# Patient Record
Sex: Male | Born: 1945 | Race: White | Hispanic: No | Smoking: Never smoker
Health system: Southern US, Community
[De-identification: ages and names within clinical notes are randomized; demographics above are authoritative.]

## PROBLEM LIST (undated history)

## (undated) DIAGNOSIS — F419 Anxiety disorder, unspecified: Secondary | ICD-10-CM

## (undated) DIAGNOSIS — I10 Essential (primary) hypertension: Secondary | ICD-10-CM

## (undated) DIAGNOSIS — R350 Frequency of micturition: Secondary | ICD-10-CM

## (undated) DIAGNOSIS — T4145XA Adverse effect of unspecified anesthetic, initial encounter: Secondary | ICD-10-CM

## (undated) DIAGNOSIS — M1990 Unspecified osteoarthritis, unspecified site: Secondary | ICD-10-CM

## (undated) DIAGNOSIS — M797 Fibromyalgia: Secondary | ICD-10-CM

## (undated) DIAGNOSIS — E785 Hyperlipidemia, unspecified: Secondary | ICD-10-CM

## (undated) DIAGNOSIS — T8859XA Other complications of anesthesia, initial encounter: Secondary | ICD-10-CM

## (undated) DIAGNOSIS — E232 Diabetes insipidus: Secondary | ICD-10-CM

## (undated) DIAGNOSIS — M26609 Unspecified temporomandibular joint disorder, unspecified side: Secondary | ICD-10-CM

## (undated) DIAGNOSIS — G4730 Sleep apnea, unspecified: Secondary | ICD-10-CM

## (undated) DIAGNOSIS — Z87442 Personal history of urinary calculi: Secondary | ICD-10-CM

## (undated) DIAGNOSIS — E119 Type 2 diabetes mellitus without complications: Secondary | ICD-10-CM

## (undated) HISTORY — DX: Fibromyalgia: M79.7

## (undated) HISTORY — PX: BRAIN SURGERY: SHX531

## (undated) HISTORY — DX: Hyperlipidemia, unspecified: E78.5

## (undated) HISTORY — DX: Type 2 diabetes mellitus without complications: E11.9

## (undated) HISTORY — PX: SPINE SURGERY: SHX786

## (undated) HISTORY — PX: ROTATOR CUFF REPAIR: SHX139

## (undated) HISTORY — DX: Essential (primary) hypertension: I10

---

## 1998-01-12 ENCOUNTER — Ambulatory Visit (HOSPITAL_COMMUNITY): Admission: RE | Admit: 1998-01-12 | Discharge: 1998-01-12 | Payer: Self-pay | Admitting: Neurosurgery

## 1998-02-02 ENCOUNTER — Ambulatory Visit: Admission: RE | Admit: 1998-02-02 | Discharge: 1998-02-02 | Payer: Self-pay | Admitting: Otolaryngology

## 1998-07-09 ENCOUNTER — Encounter: Payer: Self-pay | Admitting: Neurosurgery

## 1998-07-09 ENCOUNTER — Ambulatory Visit (HOSPITAL_COMMUNITY): Admission: RE | Admit: 1998-07-09 | Discharge: 1998-07-09 | Payer: Self-pay | Admitting: Neurosurgery

## 1999-07-08 ENCOUNTER — Ambulatory Visit (HOSPITAL_COMMUNITY): Admission: RE | Admit: 1999-07-08 | Discharge: 1999-07-08 | Payer: Self-pay | Admitting: Neurosurgery

## 1999-07-08 ENCOUNTER — Encounter: Payer: Self-pay | Admitting: Neurosurgery

## 2000-07-03 ENCOUNTER — Ambulatory Visit (HOSPITAL_COMMUNITY): Admission: RE | Admit: 2000-07-03 | Discharge: 2000-07-03 | Payer: Self-pay | Admitting: Neurosurgery

## 2000-07-03 ENCOUNTER — Encounter: Payer: Self-pay | Admitting: Neurosurgery

## 2002-06-24 ENCOUNTER — Encounter: Payer: Self-pay | Admitting: Neurosurgery

## 2002-06-28 ENCOUNTER — Inpatient Hospital Stay (HOSPITAL_COMMUNITY): Admission: RE | Admit: 2002-06-28 | Discharge: 2002-06-29 | Payer: Self-pay | Admitting: Neurosurgery

## 2002-06-28 ENCOUNTER — Encounter: Payer: Self-pay | Admitting: Neurosurgery

## 2005-03-13 ENCOUNTER — Encounter: Admission: RE | Admit: 2005-03-13 | Discharge: 2005-03-13 | Payer: Self-pay | Admitting: Otolaryngology

## 2010-07-16 ENCOUNTER — Encounter
Admission: RE | Admit: 2010-07-16 | Discharge: 2010-08-19 | Payer: Self-pay | Source: Home / Self Care | Attending: Neurosurgery | Admitting: Neurosurgery

## 2011-01-17 NOTE — Op Note (Signed)
NAME:  Troy Collier, Troy Collier NO.:  [id number]   MEDICAL RECORD NO.:  [id number]                   PATIENT TYPE:  INP   LOCATION:  3172                                 FACILITY:  [facility]   PHYSICIAN:  Danae Orleans. Venetia Maxon, M.D.               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   PROCEDURE:  Anterior cervical diskectomy and fusion, C4-5, C5-6, and C6-7  levels, with allograft bone graft and anterior cervical plate.   SURGEON:  Danae Orleans. Venetia Maxon, M.D.   ASSISTANT:  Payton Doughty, M.D.   ANESTHESIA:  General endotracheal anesthesia.   ESTIMATED BLOOD LOSS:  75 cc.   COMPLICATIONS:  None.   DISPOSITION:  To recovery.   INDICATIONS:  The patient is a 65 year old man with cervical  myeloradiculopathy.  The C4-5, C5-6, and C6-7 levels are all affected.  It  was elected to take him to surgery for anterior cervical diskectomy and  fusion at these affected levels.   DESCRIPTION OF PROCEDURE:  The patient was brought to the operating room.  Following satisfactory and uncomplicated induction of general endotracheal  anesthesia and placement of intravenous lines, the patient was placed in the  supine position on the operating table.  His neck was placed in slight  extension, and he was placed in 10 pounds of Holter traction.  His anterior  neck was then prepped and draped in the usual sterile fashion.  The area of  planned incision was infiltrated with 0.25% Marcaine and 0.5% lidocaine with  1:200,000 epinephrine.  An incision was made from the midline to  the  anterior border of the sternocleidomastoid muscle, carried sharply through  the platysmal layer.  Subplatysmal dissection was then performed, and using  blunt dissection the carotid sheath was kept lateral and the trachea and  esophagus kept medial, exposing the anterior cervical spine.  A spinal  needle was placed over what was felt to be the C4-5 level, and this was  confirmed on intraoperative x-ray.  Subsequently the longus colli muscles  were taken down from the anterior spine from the C4 through C7 levels  bilaterally using electrocautery, Key elevator, and the Shadow Line self-  retaining retractor was placed along with up-down retractor.  The C5-6 and  C6-7 levels were initially operated.  They had large osteophytes, removed  with the Leksell rongeurs, and subsequently the disk space was incised and  disk material was then removed in a piecemeal fashion using pituitary  rongeurs and a variety of Karlin curettes.  The  disk space spreaders were  placed and residual disk material was cleared out down to the level of the  posterior longitudinal ligament.  Subsequently the Anspach drill and the A2  equivalent bur was then used to drill the end plates, initially at C6-7, and  subsequently the posterior longitudinal ligament was incised with an  arachnoid knife and removed in a piecemeal fashion using 2 and 3 mm gold-  tipped Kerrison rongeurs.  Both C7 nerve roots were decompressed widely as  they extended up the neural foramina.  The central spinal cord dura was also  decompressed.  Hemostasis was assured with Gelfoam soaked in thrombin and  after sizing with dummy sizers, an 8 mm tricortical iliac crest allograft  bone graft was cut to a depth of approximately 12 mm and inserted into the  interspace and counter sunk appropriately.  Attention was then turned to the  C5-6 level, where a similar decompression was performed, and again the  central cord dura was decompressed, as were  both C6 nerve roots.  A  similarly-sized bone graft was placed at this level.  Subsequently attention  was then turned to the C4-5 level.  The retractor was moved to this level.  The disk space was incised and disk material was cleared in a similar  fashion to the other levels.  The disk space spreader was placed.  The  interspace was drilled with the high-speed drill.  There was a significant  amount of herniated disk material, particularly on the left, at the C4-5  level affecting the left C5 nerve root.  The dura and spinal cord were  decompressed.  Hemostasis was again assured with Gelfoam soaked in thrombin.  Then a similarly-sized 8 mm bone graft was inserted into this interspace and  counter sunk appropriately.  Subsequently the microscope was taken out of  the field and using the Trinica anterior cervical plate system, a 66 mm  plate was selected and found to fit appropriately.  This was then affixed to  the anterior cervical spine using 14 mm variable-angled screws, two at C4,  two at C5, two at C6, and two at C7 levels.  All screws had excellent  purchase.  They were tightened securely and then locking mechanisms were  engaged.  The wound was then copiously irrigated with bacitracin and saline,  hemostasis was assured, soft tissues were inspected and found to be in good  repair.  The final x-ray confirmed positioning of bone graft and anterior  cervical plate from C4 through the C6 level.  It was not possible to  visualize C7 because of the patient's body habitus.  The platysmal layer was  then closed with 3-0 Vicryl sutures and the skin edges reapproximated with  running 4-0 Vicryl subcuticular stitch.  The wound was dressed with  Dermabond.  The patient was extubated in the operating room and taken to the  recovery room in stable and satisfactory condition, having tolerated his  operation well.  Counts were correct at the end of the case.                                               Danae Orleans. Venetia Maxon, M.D.    JDS/MEDQ  D:  06/28/2002  T:  06/28/2002  Job:  [id number]

## 2013-01-20 ENCOUNTER — Encounter: Payer: Self-pay | Admitting: Dietician

## 2013-01-20 ENCOUNTER — Encounter: Payer: 59 | Attending: Physician Assistant | Admitting: Dietician

## 2013-01-20 VITALS — Ht 68.0 in | Wt 204.0 lb

## 2013-01-20 DIAGNOSIS — E119 Type 2 diabetes mellitus without complications: Secondary | ICD-10-CM

## 2013-01-20 NOTE — Progress Notes (Unsigned)
  Patient was seen on 01/20/2013 for the first of a series of three diabetes self-management courses at the [facility]. The following learning objectives were met by the patient during this course:  HT:68 in    WT: 204.0 lb    A1C: 6.5   (10/2012)   Defines the role of glucose and insulin  Identifies type of diabetes and pathophysiology  Defines the diagnostic criteria for diabetes and prediabetes  States the risk factors for Type 2 Diabetes  States the symptoms of Type 2 Diabetes  Defines Type 2 Diabetes treatment goals  Defines Type 2 Diabetes treatment options  States the rationale for glucose monitoring  Identifies A1C, glucose targets, and testing times  Identifies proper sharps disposal  Defines the purpose of a diabetes food plan  Identifies carbohydrate food groups  Defines effects of carbohydrate foods on glucose levels  Identifies carbohydrate choices/grams/food labels  States benefits of physical activity and effect on glucose  Review of suggested activity guidelines  Handouts given during class include:  Type 2 Diabetes: Basics Book  My Food Plan Book  Food and Activity Log   Follow-Up Plan: Plans to contact insurance company and see if they will cover the remaining Diabetes Self-management Core Class 2 &3

## 2013-04-01 ENCOUNTER — Other Ambulatory Visit: Payer: Self-pay | Admitting: Neurosurgery

## 2013-04-25 ENCOUNTER — Other Ambulatory Visit: Payer: Self-pay | Admitting: *Deleted

## 2013-04-25 DIAGNOSIS — E7800 Pure hypercholesterolemia, unspecified: Secondary | ICD-10-CM

## 2013-04-25 DIAGNOSIS — E039 Hypothyroidism, unspecified: Secondary | ICD-10-CM | POA: Insufficient documentation

## 2013-04-26 ENCOUNTER — Other Ambulatory Visit (INDEPENDENT_AMBULATORY_CARE_PROVIDER_SITE_OTHER): Payer: 59

## 2013-04-26 ENCOUNTER — Other Ambulatory Visit: Payer: Self-pay | Admitting: *Deleted

## 2013-04-26 DIAGNOSIS — E7800 Pure hypercholesterolemia, unspecified: Secondary | ICD-10-CM

## 2013-04-26 DIAGNOSIS — E039 Hypothyroidism, unspecified: Secondary | ICD-10-CM

## 2013-04-26 DIAGNOSIS — E119 Type 2 diabetes mellitus without complications: Secondary | ICD-10-CM

## 2013-04-26 DIAGNOSIS — R7303 Prediabetes: Secondary | ICD-10-CM | POA: Insufficient documentation

## 2013-04-26 LAB — LIPID PANEL
LDL Cholesterol: 54 mg/dL (ref 0–99)
Total CHOL/HDL Ratio: 3

## 2013-04-26 LAB — T4, FREE: Free T4: 1.1 ng/dL (ref 0.60–1.60)

## 2013-04-26 LAB — TSH: TSH: 0.1 u[IU]/mL — ABNORMAL LOW (ref 0.35–5.50)

## 2013-04-28 ENCOUNTER — Ambulatory Visit (INDEPENDENT_AMBULATORY_CARE_PROVIDER_SITE_OTHER): Payer: 59 | Admitting: Endocrinology

## 2013-04-28 ENCOUNTER — Encounter: Payer: Self-pay | Admitting: Endocrinology

## 2013-04-28 VITALS — BP 132/76 | HR 87 | Temp 98.3°F | Resp 12 | Ht 67.0 in | Wt 202.3 lb

## 2013-04-28 DIAGNOSIS — E7800 Pure hypercholesterolemia, unspecified: Secondary | ICD-10-CM

## 2013-04-28 DIAGNOSIS — E230 Hypopituitarism: Secondary | ICD-10-CM

## 2013-04-28 DIAGNOSIS — E119 Type 2 diabetes mellitus without complications: Secondary | ICD-10-CM

## 2013-04-28 LAB — HEMOGLOBIN A1C: Hgb A1c MFr Bld: 6.1 % (ref 4.6–6.5)

## 2013-04-28 LAB — COMPREHENSIVE METABOLIC PANEL
Albumin: 4.3 g/dL (ref 3.5–5.2)
Alkaline Phosphatase: 68 U/L (ref 39–117)
BUN: 15 mg/dL (ref 6–23)
Creatinine, Ser: 0.8 mg/dL (ref 0.4–1.5)
Glucose, Bld: 106 mg/dL — ABNORMAL HIGH (ref 70–99)
Potassium: 4.4 mEq/L (ref 3.5–5.1)

## 2013-04-28 LAB — URINALYSIS
Hgb urine dipstick: NEGATIVE
Ketones, ur: NEGATIVE
Leukocytes, UA: NEGATIVE
Urine Glucose: NEGATIVE
Urobilinogen, UA: 0.2 (ref 0.0–1.0)

## 2013-04-28 LAB — TESTOSTERONE: Testosterone: 224.55 ng/dL — ABNORMAL LOW (ref 350.00–890.00)

## 2013-04-28 MED ORDER — DESMOPRESSIN ACETATE 0.2 MG PO TABS: 0.4000 mg | ORAL_TABLET | Freq: Three times a day (TID) | ORAL | Status: DC

## 2013-04-28 NOTE — Progress Notes (Signed)
Patient ID: Troy Collier, male   DOB: 22-Jul-1946, 67 y.o.   MRN: [id number]  Chief complaint: Followup of hypopituitarism  History of Present Illness:  He has had successful pituitary surgery in 1/99.  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  1. Hormone deficiency: He has been on growth hormone supplement since 2001. He thinks he is taking 0.35 mg daily although has been on as low dose as 0.2 mg previously. Overall has fairly normal energy level and no unusual fatigue 2. Hypothyroidism: Has been on relatively stable doses of thyroid supplement since his surgery 3. Hypogonadism: He is currently taking Axiron with normal libido and energy level. His testosterone supplement has been changed periodically because of insurance preference and his last level was normal 4. Adrenal insufficiency: He has been on stable doses of hydrocortisone and no recent problems with unusual weight change, nausea or lightheadedness. For some time has been taking only 15 mg hydrocortisone a day 5. Diabetes insipidus: He continues to take 2 tablets 3 times a day, roughly every 8 hours. He did have transient hyponatremia at one time and with reducing the dose to 5 tablets this was back to normal. However he tends to have increased urinary frequency and thirst if he takes less than 6 tablets a day   OTHER problems:  He has had impaired fasting glucose and IFG and this has been managed with diet and exercise alone. Recently not able to exercise because of cervical disc problems  Dyslipidemia/metabolic syndrome: He is on Lipitor, tends to have relatively low HDL        Medication List       This list is accurate as of: 04/28/13  1:35 PM.  Always use your most recent med list.               acetaminophen-codeine 300-30 MG per tablet  Commonly known as:  TYLENOL #3  Take 1 tablet by mouth every 4 (four) hours as needed for pain (can take twice a day).     ALPRAZolam 0.5 MG tablet   Commonly known as:  XANAX  Take 0.5 mg by mouth at bedtime as needed for sleep.     aspirin 81 MG tablet  Take 81 mg by mouth daily.     atorvastatin 10 MG tablet  Commonly known as:  LIPITOR  Take 10 mg by mouth daily.     AXIRON 30 MG/ACT Soln  Generic drug:  Testosterone  Place onto the skin. Takes 3 pumps per day     cyclobenzaprine 10 MG tablet  Commonly known as:  FLEXERIL  Take 10 mg by mouth 3 (three) times daily as needed for muscle spasms.     desmopressin 0.2 MG tablet  Commonly known as:  DDAVP  Take 0.2 mg by mouth 3 (three) times daily. 2 tabs three times per day.     DULoxetine 60 MG capsule  Commonly known as:  CYMBALTA  Take 60 mg by mouth daily.     etodolac 400 MG tablet  Commonly known as:  LODINE  Take 400 mg by mouth 2 (two) times daily.     hydrocortisone 5 MG tablet  Commonly known as:  CORTEF  Take 5 mg by mouth daily. 2 tabs in the AM and 1 tab at bedtime     levothyroxine 150 MCG tablet  Commonly known as:  SYNTHROID, LEVOTHROID  Take 150 mcg by mouth daily before breakfast.     losartan 100 MG tablet  Commonly known as:  COZAAR  Take 100 mg by mouth daily.     NORDITROPIN 15 MG/1.5ML Soln  Generic drug:  Somatropin  Inject into the skin. 0.35 mg injection daily.     tamsulosin 0.4 MG Caps capsule  Commonly known as:  FLOMAX  Take 0.4 mg by mouth 2 (two) times daily. 2 cap two times per day     traZODone 100 MG tablet  Commonly known as:  DESYREL  Take 100 mg by mouth at bedtime.        Allergies:  Allergies  Allergen Reactions  . Accupril [Quinapril Hcl]   . Niacin And Related     Past Medical History  Diagnosis Date  . Hyperlipidemia   . Diabetes mellitus without complication   . Hypertension   . Fibromyalgia     Past Surgical History  Procedure Laterality Date  . Spine surgery    . Brain surgery    . Rotator cuff repair      Family History  Problem Relation Age of Onset  . Hypertension Other   . Obesity  Other   . Heart attack Other     Social History:  reports that he has never smoked. He has never used smokeless tobacco. He reports that  drinks alcohol. He reports that he does not use illicit drugs.  Review of Systems -   HYPERTENSION: He has been on treatment for several years, BP at home  recently130/75  History of depression   EXAM:  BP 132/76  Pulse 87  Temp(Src) 98.3 F (36.8 C)  Resp 12  Ht 5\' 7"  (1.702 m)  Wt 202 lb 4.8 oz (91.763 kg)  BMI 31.68 kg/m2  SpO2 96%  No pedal edema Biceps reflexes appear normal  Assessment/Plan:   PANHYPOPITUITARISM:  Overall well controlled with following problems 1. Secondary hypothyroidism: Clinically doing well, free T4 to be checked 2. Cortisol deficiency: He is still doing well with only small doses of supplement and no clinical signs or symptoms of adrenal insufficiency. To check electrolytes 3. Growth hormone deficiency, long-term and requiring low-dose supplementation with good results previously with improved energy level, no complaints today 4. Hypogonadism: Subjectively has been doing well with supplementation with normal energy level and libido. Testosterone level pending and we'll discuss results when available 5. Diabetes insipidus, long-standing he is quite compliant with his supplementation and appears to be asymptomatic if taking less than 6 tablets a day, sodium pending. No nocturia  Impaired Fasting glucose: To check glucose and A1c today, again emphasized working on weight loss with diet and exercise  Hyperlipidemia/metabolic syndrome: To check lipids. Is on Lipitor for cardiovascular prophylaxis  Hypertension: Followed by PCP, today has good control  Ariyona Eid 04/28/2013, 1:35 PM   Addendum: Growth hormone needs to be reduced, IGF-1 is high, new dose to be 0.30 Also increase Axiron to 4 pumps a day  Office Visit on 04/28/2013  Component Date Value Range Status  . Sodium 04/28/2013 140  135 - 145 mEq/L  Final  . Potassium 04/28/2013 4.4  3.5 - 5.1 mEq/L Final  . Chloride 04/28/2013 100  96 - 112 mEq/L Final  . CO2 04/28/2013 30  19 - 32 mEq/L Final  . Glucose, Bld 04/28/2013 106* 70 - 99 mg/dL Final  . BUN 16/06/9603 15  6 - 23 mg/dL Final  . Creatinine, Ser 04/28/2013 0.8  0.4 - 1.5 mg/dL Final  . Total Bilirubin 04/28/2013 0.5  0.3 - 1.2 mg/dL Final  . Alkaline Phosphatase  04/28/2013 68  39 - 117 U/L Final  . AST 04/28/2013 19  0 - 37 U/L Final  . ALT 04/28/2013 28  0 - 53 U/L Final  . Total Protein 04/28/2013 7.2  6.0 - 8.3 g/dL Final  . Albumin 40/98/1191 4.3  3.5 - 5.2 g/dL Final  . Calcium 47/82/9562 9.6  8.4 - 10.5 mg/dL Final  . GFR 13/04/6577 99.94  >60.00 mL/min Final  . Hemoglobin A1C 04/28/2013 6.1  4.6 - 6.5 % Final   Glycemic Control Guidelines for People with Diabetes:Non Diabetic:  <6%Goal of Therapy: <7%Additional Action Suggested:  >8%   . Testosterone 04/28/2013 224.55* 350.00 - 890.00 ng/dL Final  . Somatomedin (IGF-I) 04/28/2013 214* 35 - 196 ng/mL Final  . Color, Urine 04/28/2013 LT. YELLOW  Yellow;Lt. Yellow Final  . APPearance 04/28/2013 CLEAR  Clear Final  . Specific Gravity, Urine 04/28/2013 1.015  1.000-1.030 Final  . pH 04/28/2013 6.5  5.0 - 8.0 Final  . Total Protein, Urine 04/28/2013 NEGATIVE  Negative Final  . Urine Glucose 04/28/2013 NEGATIVE  Negative Final  . Ketones, ur 04/28/2013 NEGATIVE  Negative Final  . Bilirubin Urine 04/28/2013 MODERATE  Negative Final  . Hgb urine dipstick 04/28/2013 NEGATIVE  Negative Final  . Urobilinogen, UA 04/28/2013 0.2  0.0 - 1.0 Final  . Leukocytes, UA 04/28/2013 NEGATIVE  Negative Final  . Nitrite 04/28/2013 NEGATIVE  Negative Final  Appointment on 04/26/2013  Component Date Value Range Status  . Free T4 04/26/2013 1.10  0.60 - 1.60 ng/dL Final  . TSH 46/96/2952 0.10* 0.35 - 5.50 uIU/mL Final  . Cholesterol 04/26/2013 110  0 - 200 mg/dL Final   ATP III Classification       Desirable:  < 200 mg/dL                Borderline High:  200 - 239 mg/dL          High:  > = 841 mg/dL  . Triglycerides 04/26/2013 108.0  0.0 - 149.0 mg/dL Final   Normal:  <324 mg/dLBorderline High:  150 - 199 mg/dL  . HDL 04/26/2013 34.90* >39.00 mg/dL Final  . VLDL 40/06/2724 21.6  0.0 - 40.0 mg/dL Final  . LDL Cholesterol 04/26/2013 54  0 - 99 mg/dL Final  . Total CHOL/HDL Ratio 04/26/2013 3   Final                  Men          Women1/2 Average Risk     3.4          3.3Average Risk          5.0          4.42X Average Risk          9.6          7.13X Average Risk          15.0          11.0

## 2013-04-28 NOTE — Patient Instructions (Addendum)
No change in Rx yet

## 2013-04-29 LAB — INSULIN-LIKE GROWTH FACTOR: Somatomedin (IGF-I): 214 ng/mL — ABNORMAL HIGH (ref 35–196)

## 2013-05-09 ENCOUNTER — Telehealth: Payer: Self-pay | Admitting: *Deleted

## 2013-05-09 NOTE — Telephone Encounter (Signed)
lmtcb

## 2013-05-09 NOTE — Telephone Encounter (Signed)
Message copied by Hermenia Bers on Mon May 09, 2013  9:10 AM ------      Message from: Troy Collier      Created: Tue May 03, 2013  8:15 AM       His growth hormone level is high, need to know exactly what dosage he is taking      Testosterone level low, and needs to increase Axiron from 3-4 PUMPS daily ------

## 2013-05-10 ENCOUNTER — Telehealth: Payer: Self-pay | Admitting: Endocrinology

## 2013-05-10 MED ORDER — TESTOSTERONE 30 MG/ACT TD SOLN: 30.0000 mg | Freq: Every day | TRANSDERMAL | Status: DC

## 2013-05-10 NOTE — Telephone Encounter (Signed)
Pt says he will call back tomorrow with exact dose of growth hormone, but he said it's 3 clicks?

## 2013-05-10 NOTE — Telephone Encounter (Signed)
Message copied by Hermenia Bers on Tue May 10, 2013  4:38 PM ------      Message from: Reather Littler      Created: Tue May 03, 2013  8:15 AM       His growth hormone level is high, need to know exactly what dosage he is taking      Testosterone level low, and needs to increase Axiron from 3-4 PUMPS daily ------

## 2013-05-12 ENCOUNTER — Other Ambulatory Visit: Payer: Self-pay | Admitting: *Deleted

## 2013-05-12 MED ORDER — TESTOSTERONE 30 MG/ACT TD SOLN: 30.0000 mg | Freq: Four times a day (QID) | TRANSDERMAL | Status: DC

## 2013-05-13 ENCOUNTER — Telehealth: Payer: Self-pay | Admitting: *Deleted

## 2013-05-13 MED ORDER — SOMATROPIN 15 MG/1.5ML ~~LOC~~ SOLN: SUBCUTANEOUS | Status: DC

## 2013-05-13 NOTE — Telephone Encounter (Signed)
Pt says his rx for the norditropin was written for 0.35, but he's been taking 0.3, He needs a new rx sent in for this med.

## 2013-05-13 NOTE — Telephone Encounter (Signed)
Since the level is high with 0.3 mg he should change it to 0.2 mg daily

## 2013-05-16 ENCOUNTER — Telehealth: Payer: Self-pay | Admitting: *Deleted

## 2013-05-16 NOTE — Telephone Encounter (Signed)
rx sent, pt aware 

## 2013-05-17 ENCOUNTER — Telehealth: Payer: Self-pay | Admitting: Endocrinology

## 2013-05-17 NOTE — Telephone Encounter (Signed)
Please call [facility], re: change strength of Nordotropin / Sherri S. .

## 2013-05-18 NOTE — Telephone Encounter (Signed)
Spoke with [facility] about Norditropin, they will ship it out to the patient.

## 2013-05-30 ENCOUNTER — Other Ambulatory Visit (HOSPITAL_COMMUNITY): Payer: 59

## 2013-06-01 ENCOUNTER — Encounter (HOSPITAL_COMMUNITY): Payer: Self-pay

## 2013-06-01 ENCOUNTER — Encounter (HOSPITAL_COMMUNITY)
Admission: RE | Admit: 2013-06-01 | Discharge: 2013-06-01 | Disposition: A | Discharge status: Home / Self Care | Payer: 59 | Source: Ambulatory Visit | Attending: Neurosurgery | Admitting: Neurosurgery

## 2013-06-01 DIAGNOSIS — Z01812 Encounter for preprocedural laboratory examination: Secondary | ICD-10-CM | POA: Insufficient documentation

## 2013-06-01 DIAGNOSIS — Z01818 Encounter for other preprocedural examination: Secondary | ICD-10-CM | POA: Insufficient documentation

## 2013-06-01 HISTORY — DX: Adverse effect of unspecified anesthetic, initial encounter: T41.45XA

## 2013-06-01 HISTORY — DX: Unspecified temporomandibular joint disorder, unspecified side: M26.609

## 2013-06-01 HISTORY — DX: Unspecified osteoarthritis, unspecified site: M19.90

## 2013-06-01 HISTORY — DX: Anxiety disorder, unspecified: F41.9

## 2013-06-01 HISTORY — DX: Frequency of micturition: R35.0

## 2013-06-01 HISTORY — DX: Sleep apnea, unspecified: G47.30

## 2013-06-01 HISTORY — DX: Personal history of urinary calculi: Z87.442

## 2013-06-01 HISTORY — DX: Diabetes insipidus: E23.2

## 2013-06-01 HISTORY — DX: Other complications of anesthesia, initial encounter: T88.59XA

## 2013-06-01 LAB — BASIC METABOLIC PANEL
BUN: 10 mg/dL (ref 6–23)
Calcium: 9.6 mg/dL (ref 8.4–10.5)
Chloride: 101 mEq/L (ref 96–112)
Creatinine, Ser: 0.64 mg/dL (ref 0.50–1.35)
GFR calc Af Amer: >90 mL/min (ref >90)
Glucose, Bld: 100 mg/dL — ABNORMAL HIGH (ref 70–99)

## 2013-06-01 LAB — CBC
Hemoglobin: 15 g/dL (ref 13.0–17.0)
MCV: 91.3 fL (ref 78.0–100.0)
Platelets: 200 10*3/uL (ref 150–400)
RBC: 4.83 MIL/uL (ref 4.22–5.81)
RDW: 12.5 % (ref 11.5–15.5)
WBC: 5.8 10*3/uL (ref 4.0–10.5)

## 2013-06-01 LAB — SURGICAL PCR SCREEN: MRSA, PCR: NEGATIVE

## 2013-06-01 LAB — ABO/RH: ABO/RH(D): A POS

## 2013-06-01 LAB — TYPE AND SCREEN: Antibody Screen: NEGATIVE

## 2013-06-01 NOTE — Pre-Procedure Instructions (Signed)
Troy Collier  06/01/2013   Your procedure is scheduled on:   06/07/13  Tuesday    Report to [facility]  2 * 3 at 530 AM.  Call this number if you have problems the morning of surgery: [phone]   Remember:   Do not eat food or drink liquids after midnight.   Take these medicines the morning of surgery with A SIP OF WATER:  HYDROCODONE, DDAVP, CYMBALTA, HYDROCORTISONE, LEVOTHYROXINE  (STOP ASPIRIN, COUMADIN, PLAVIX, EFFIENT, HERBAL MEDICINES)   Do not wear jewelry, make-up or nail polish.  Do not wear lotions, powders, or perfumes. You may wear deodorant.  Do not shave 48 hours prior to surgery. Men may shave face and neck.  Do not bring valuables to the hospital.  [facility] is not responsible                  for any belongings or valuables.               Contacts, dentures or bridgework may not be worn into surgery.  Leave suitcase in the car. After surgery it may be brought to your room.  For patients admitted to the hospital, discharge time is determined by your                treatment team.               Patients discharged the day of surgery will not be allowed to drive  home.  Name and phone number of your driver:  Special Instructions: Shower using CHG 2 nights before surgery and the night before surgery.  If you shower the day of surgery use CHG.  Use special wash - you have one bottle of CHG for all showers.  You should use approximately 1/3 of the bottle for each shower.   Please read over the following fact sheets that you were given: Pain Booklet, Coughing and Deep Breathing, Blood Transfusion Information, MRSA Information and Surgical Site Infection Prevention

## 2013-06-01 NOTE — Progress Notes (Signed)
06/01/13 1011  OBSTRUCTIVE SLEEP APNEA  Have you ever been diagnosed with sleep apnea through a sleep study? Yes (very mild  )  If yes, do you have and use a CPAP or BPAP machine every night? 0 (not recommended)  Do you snore loudly (loud enough to be heard through closed doors)?  1  Do you often feel tired, fatigued, or sleepy during the daytime? 0  Has anyone observed you stop breathing during your sleep? 0  Do you have, or are you being treated for high blood pressure? 1  BMI more than 35 kg/m2? 0  Age over 25 years old? 1  Neck circumference greater than 40 cm/18 inches? 0  Gender: 1  Obstructive Sleep Apnea Score 4  Score 4 or greater  Results sent to PCP

## 2013-06-05 NOTE — H&P (Signed)
OUTPATIENT OFFICE NOTE   CHIEF COMPLAINT:                              Troy Collier returns today to review his EMG/nerve conduction velocities of his right upper extremity.                                 HOPI:                                                   He is concerned because he is losing strength in his right hand and says he is right-handed and he is an Tree surgeon, and he is very concerned that he is losing function.  He wishes to get this treated.   DATA:                                                  EMG/nerve conduction velocities shows that he has a C8 radiculopathy on the right.  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    I reviewed his MRI, which shows that he has significant degeneration at the C7-T1 level with prior fusion from C4 to C7 levels.  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    PHYSICAL EXAMINATION:                    Positive Tinel sign in the right elbow.   IMPRESSION/PLAN:                             We talked about treatment options.  He has been in chiropractic treatment and therapy without relief, and he continues to have significant weakness.  I recommended that he undergo posterior cervical decompression and exploration of the C6-7 fusion to make sure there is no evidence of non-union and then foraminotomy decompression at C7-T1 with right ulnar nerve decompression.  He wishes to do this but wants to schedule it towards the later part of September.   We are going to discuss risks and benefits.  Arlys Chane will fit him for a collar and have further patient education with him.       _________________________________ Danae Orleans. Venetia Maxon, MD   OUTPATIENT OFFICE NOTE   HOPI:                                                   Troy Collier comes in today to review his EMG nerve conduction test and cervical radiographs and cervical MRI.      DATA:                                                  His cervical radiograph shows that he has solid  arthrodesis at previously  operated C4-C7 levels without evidence of abnormal motion on flexion and extension views.  He has a well positioned anterior cervical plate.  He does have spondylosis and anterolisthesis of C7 on T1.  He does appear to have some foraminal stenosis bilaterally at C7-T1.  His MRI demonstrates that he has central disc protrusion at C3-4, which does not appear to be causing significant cord compression.  He has biforaminal stenosis C7-T1 which appears to be worse on the left than on the right.     I reviewed an EMG nerve conduction velocity test performed by Dr. Alvester Morin in 12/07/2009.  This was way before he was having these symptoms and the finding was of a mild chronic C5 or C6 radiculopathy on the right.             PHYSICAL EXAMINATION:                    I re-examined him today, and he has right hand intrinsic weakness.  He has a positive Tinel's sign at the right elbow.  He does not have any numbness in his hand to pinprick.      IMPRESSION/PLAN:                             Given his new symptoms, I think we need to do a new EMG nerve conduction velocities as this may be an ulnar neuropathy or may also be a cervical radiculopathy.  I will do the EMG nerve conduction velocities as soon as possible, see him back and make further recommendations.               _________________________________ Danae Orleans Venetia Maxon, MD     OUTPATIENT OFFICE NOTE   HOPI:                                                   Troy Collier comes in today.  I have not seen him since 07/2011.  At that time, he was scheduled for left shoulder surgery and subsequently underwent that.  He now comes in complaining of right-sided neck pain and right arm pain and weakness.  He had his shoulder surgery in 2012 and this went well.  He notes that when he tips his head back and to the right, he gets right arm pain and right hand pain and weakness.        DATA:                                                  I  reviewed x-rays of his cervical spine that were obtained a year ago, which show some degenerative changes at C3-4 and previous anterior cervical plating C4-C7 levels.    PHYSICAL EXAMINATION:                    He has hand intrinsic and finger extensor weakness and he complains of numbness radiating into his right hand.  He does have some splitting of his fourth digit on the right on sensory testing but he has a negative Tinel's sign at the right elbow.  He has reproducible neck and arm  pain and numbness when he tips his head to the right consistent with a positive Spurling maneuver.  He appears to have otherwise full strength in upper and lower extremities.  He is not complaining of a lot of back pain, which was a problem for him when he was here last and he says his legs are not bothering him.  Strength appears to be good in his lower extremities.  He has a negative Hoffman's sign.   IMPRESSION/PLAN:                             At this point, my suspicion is that he has a cervical radiculopathy, although he certainly could have a concomitant ulnar neuropathy.  He has had a nerve conduction test and was told that his arm was not the source of the problem.  I have asked for him to bring that study with him and also we will get an MRI of his cervical spine and plain radiographs.       _________________________________ Danae Orleans. Venetia Maxon, MD NEUROSURGICAL CONSULTATION     Troy Collier                                 DOB:  1945/12/29               #[id number]                    June 24, 2010   HISTORY:                                                                             Troy Collier is an established patient of mine who comes in today with a chief complaint of multiple medical problems including digestive problems recently, prostate hypertrophy, and noting sharp pains on both sides and pain when he turns his head and lifts his shoulders.  He feels it can be on either side.  He says the neck is sore.   He has had prior surgery me which included a resection of a pituitary adenoma as well as a 3-level anterior cervical decompression and fusion.  This was done at C4 through C7 levels in 2003.  He now is complaining of pain which is worse in the morning. He did have an injection in his right shoulder previously and got no help, and also had an injection in his right hip when he had problems with that.    REVIEW OF SYSTEMS:                                     A detailed Review of Systems sheet was reviewed with the patient.  Pertinent positives include wears glasses, ringing in ears, high cholesterol, nausea, gastritis, difficulty starting or stopping stream, arm pain, joint pain or swelling, arthritis, neck pain, fainting spells or "blacking out", anxiety, diabetes (insipidus), and thyroid disease.  All other systems are negative; this includes Constitutional symptoms, Eyes, Cardiovascular, Ears, nose, mouth, throat, Endocrine, Respiratory, Gastrointestinal, Genitourinary, Musculoskeletal, Integumentary &  Breast, Neurologic, Psychiatric, Hematologic/Lymphatic, Allergic/Immunologic.    PAST MEDICAL HISTORY:                                         Current Medical Conditions:                               As described previously.             Prior Operations and Hospitalizations:            As described previously.             Medications and Allergies:                   Medications - Vitamin C 500 mg. one or two qd, Acetaminophen with codeine 300/30 mg. one or two bid, Cyclobenzaprine 10 mg. qhs prn, Losartan 100 mg. qd, Vitamin D3 2000 IU qd, Humatrope 0.5 mg. subcutaneously qd, AndroGel 1.25 mg. 8 pumps applied topically qam, Cortef 5 mg. two tid, DDAVP 0.2 mg. two tablets tid, Levothyroxine 0.150 mg. qd, Lidoderm patch 5% qd, Vicodin 5/500 mg. one po q4h, Trazodone 100 mg. qhs, Cymbalta 60 mg. qd, Vitamin B6 100 mg. one or two qhs, multivitamin one qd, Calcium 600 mg. one or two qd, Glucosamine/MSM one  qd, Fish Oil 1200 mg. one qd, Omeprazole 40 mg. bid, Viagra 50 mg. prn, [facility] one qd, Xanax 0.5 mg. prn, Flomax 0.4 qd.  DRUG ALLERGIES - NIACIN AND ACCUPRIL.   Troy Collier                                 DOB:  June 21, 1946               #[id number]                    June 24, 2010 Page Two              Height and Weight:                                                 He is currently 5'9" tall, 210 lbs.    FAMILY HISTORY:                                                             His mother died at age 55 of heart failure. His father died at age 24 of heart failure.    SOCIAL HISTORY:                                                              He denies tobacco or drug use. He is a rare drinker of alcoholic beverages.  His wife  died from a brain tumor.   DIAGNOSTIC STUDIES:                                   I reviewed radiographs of his cervical spine which show some left-sided C1-2 facet degenerative changes and a solid arthrodesis at the previously operated levels.    PHYSICAL EXAMINATION:                                       General Appearance:                                             On physical examination, Troy Collier is a pleasant and cooperative man in no acute distress.             Neck - He has a minimally positive Spurlings' maneuver to either side.  He has markedly positive shoulder impingement testing particularly on the right.               Musculoskeletal Examination - He has negative Tinel's and Phalen's signs at both wrists.  He has more pain with rotation or lifting the shoulder than with moving his neck.      NEUROLOGICAL EXAMINATION:               The patient is oriented to time, person and place and has good recall of both recent and remote memory with normal attention span and concentration.  The patient speaks with clear and fluent speech and exhibits normal language function and appropriate fund of knowledge.             Cranial Nerve  Examination - pupils are equal, round and reactive to light.  Extraocular movements are full.  Visual fields are full to confrontational testing.  Facial sensation and facial movement are symmetric and intact.  Hearing is intact to finger rub.  Palate is upgoing.  Shoulder shrug is symmetric.  Tongue protrudes in the midline.             Motor Examination - motor strength is 5/5 in the bilateral deltoids, biceps, triceps, handgrips, wrist extensors, interosseous.  In the lower extremities motor strength is 5/5 in hip flexion, extension, quadriceps, hamstrings, plantar flexion, dorsiflexion and extensor hallucis longus.             Sensory Examination - normal to light touch and pinprick sensation in the upper and lower extremities.  Troy Collier                                 DOB:  March 20, 1946               #[id number]                    June 24, 2010 Page Three              Deep Tendon Reflexes - 2 in the biceps, triceps, and brachioradialis, 2 in the knees, 2 in the ankles.  The great toes are downgoing to plantar stimulation.            Cerebellar Examination - normal coordination in  upper and lower extremities and normal rapid alternating movements.  Romberg test is negative.    IMPRESSION AND RECOMMENDATIONS:             Troy Collier is a 67 year old man with what appears to be a rotator cuff syndrome on the right, also with some adhesive capsulitis. He does have some cervical spondylosis and also has some cervical neck facet arthropathy.  I recommended he undergo a course of physical therapy. We will set this up through [facility] for his right rotator cuff pain and can also work on his neck. He is going to followup with me in two months to make sure that these problems are improving for him.  At this point I do not think we need to get any new imaging studies and I recommended holding off on injection therapy at the present time.  If he does not improve he may require  imaging of his shoulder and/or possibly his neck, and also orthopedic evaluation for his shoulder.    [facility]       Danae Orleans. Venetia Maxon, M.D.

## 2013-06-06 MED ORDER — CEFAZOLIN SODIUM-DEXTROSE 2-3 GM-% IV SOLR
2.0000 g | INTRAVENOUS | Status: AC
  Administered 2013-06-07: 2 g via INTRAVENOUS
  Filled 2013-06-06: qty 50

## 2013-06-07 ENCOUNTER — Observation Stay (HOSPITAL_COMMUNITY)
Admission: RE | Admit: 2013-06-07 | Discharge: 2013-06-08 | Disposition: A | Discharge status: Home / Self Care | Payer: 59 | Source: Ambulatory Visit | Attending: Neurosurgery | Admitting: Neurosurgery

## 2013-06-07 ENCOUNTER — Encounter (HOSPITAL_COMMUNITY)
Admission: RE | Disposition: A | Discharge status: Home / Self Care | Payer: Self-pay | Source: Ambulatory Visit | Attending: Neurosurgery

## 2013-06-07 ENCOUNTER — Inpatient Hospital Stay (HOSPITAL_COMMUNITY): Payer: 59

## 2013-06-07 ENCOUNTER — Encounter (HOSPITAL_COMMUNITY): Payer: Self-pay

## 2013-06-07 ENCOUNTER — Inpatient Hospital Stay (HOSPITAL_COMMUNITY): Payer: 59 | Admitting: Anesthesiology

## 2013-06-07 ENCOUNTER — Encounter (HOSPITAL_COMMUNITY): Payer: Self-pay | Admitting: Anesthesiology

## 2013-06-07 DIAGNOSIS — M7130 Other bursal cyst, unspecified site: Secondary | ICD-10-CM | POA: Insufficient documentation

## 2013-06-07 DIAGNOSIS — I10 Essential (primary) hypertension: Secondary | ICD-10-CM | POA: Insufficient documentation

## 2013-06-07 DIAGNOSIS — G5620 Lesion of ulnar nerve, unspecified upper limb: Secondary | ICD-10-CM | POA: Insufficient documentation

## 2013-06-07 DIAGNOSIS — M47812 Spondylosis without myelopathy or radiculopathy, cervical region: Secondary | ICD-10-CM | POA: Insufficient documentation

## 2013-06-07 DIAGNOSIS — Z981 Arthrodesis status: Secondary | ICD-10-CM | POA: Insufficient documentation

## 2013-06-07 DIAGNOSIS — Z79899 Other long term (current) drug therapy: Secondary | ICD-10-CM | POA: Insufficient documentation

## 2013-06-07 DIAGNOSIS — Q762 Congenital spondylolisthesis: Principal | ICD-10-CM | POA: Insufficient documentation

## 2013-06-07 HISTORY — PX: ULNAR NERVE TRANSPOSITION: SHX2595

## 2013-06-07 HISTORY — PX: POSTERIOR CERVICAL FUSION/FORAMINOTOMY: SHX5038

## 2013-06-07 LAB — GLUCOSE, CAPILLARY
Glucose-Capillary: 111 mg/dL — ABNORMAL HIGH (ref 70–99)
Glucose-Capillary: 139 mg/dL — ABNORMAL HIGH (ref 70–99)
Glucose-Capillary: 164 mg/dL — ABNORMAL HIGH (ref 70–99)
Glucose-Capillary: 134 mg/dL — ABNORMAL HIGH (ref 70–99)

## 2013-06-07 SURGERY — POSTERIOR CERVICAL FUSION/FORAMINOTOMY LEVEL 1
Anesthesia: General | Laterality: Right | Wound class: Clean

## 2013-06-07 MED ORDER — FENTANYL CITRATE 0.05 MG/ML IJ SOLN
INTRAMUSCULAR | Status: DC | PRN
  Administered 2013-06-07: 200 ug via INTRAVENOUS
  Administered 2013-06-07: 50 ug via INTRAVENOUS

## 2013-06-07 MED ORDER — SODIUM CHLORIDE 0.9 % IJ SOLN: 3.0000 mL | INTRAMUSCULAR | Status: DC | PRN

## 2013-06-07 MED ORDER — LIDOCAINE-EPINEPHRINE 1 %-1:100000 IJ SOLN
INTRAMUSCULAR | Status: DC | PRN
  Administered 2013-06-07 (×2): 10 mL

## 2013-06-07 MED ORDER — MIDAZOLAM HCL 5 MG/5ML IJ SOLN
INTRAMUSCULAR | Status: DC | PRN
  Administered 2013-06-07 (×2): 1 mg via INTRAVENOUS

## 2013-06-07 MED ORDER — ALBUMIN HUMAN 5 % IV SOLN
INTRAVENOUS | Status: DC | PRN
  Administered 2013-06-07: 09:00:00 via INTRAVENOUS

## 2013-06-07 MED ORDER — ARTIFICIAL TEARS OP OINT
TOPICAL_OINTMENT | OPHTHALMIC | Status: DC | PRN
  Administered 2013-06-07: 1 via OPHTHALMIC

## 2013-06-07 MED ORDER — OXYCODONE HCL 5 MG PO TABS
5.0000 mg | ORAL_TABLET | Freq: Once | ORAL | Status: AC | PRN
  Administered 2013-06-07: 5 mg via ORAL

## 2013-06-07 MED ORDER — HYDROMORPHONE HCL PF 1 MG/ML IJ SOLN
INTRAMUSCULAR | Status: AC
  Filled 2013-06-07: qty 1

## 2013-06-07 MED ORDER — HYDROCORTISONE 5 MG PO TABS
5.0000 mg | ORAL_TABLET | Freq: Every day | ORAL | Status: DC
  Administered 2013-06-07: 5 mg via ORAL
  Filled 2013-06-07 (×2): qty 1

## 2013-06-07 MED ORDER — DIAZEPAM 5 MG PO TABS
ORAL_TABLET | ORAL | Status: AC
  Filled 2013-06-07: qty 1

## 2013-06-07 MED ORDER — PROPOFOL 10 MG/ML IV BOLUS
INTRAVENOUS | Status: DC | PRN
  Administered 2013-06-07: 30 mg via INTRAVENOUS
  Administered 2013-06-07: 170 mg via INTRAVENOUS

## 2013-06-07 MED ORDER — ALPRAZOLAM 0.5 MG PO TABS: 0.5000 mg | ORAL_TABLET | Freq: Every evening | ORAL | Status: DC | PRN

## 2013-06-07 MED ORDER — ADULT MULTIVITAMIN W/MINERALS CH
1.0000 | ORAL_TABLET | Freq: Every day | ORAL | Status: DC
  Administered 2013-06-08: 1 via ORAL
  Filled 2013-06-07: qty 1

## 2013-06-07 MED ORDER — OXYCODONE-ACETAMINOPHEN 5-325 MG PO TABS
1.0000 | ORAL_TABLET | ORAL | Status: DC | PRN
  Administered 2013-06-07 – 2013-06-08 (×4): 2 via ORAL
  Filled 2013-06-07 (×4): qty 2

## 2013-06-07 MED ORDER — ACETAMINOPHEN 325 MG PO TABS: 650.0000 mg | ORAL_TABLET | ORAL | Status: DC | PRN

## 2013-06-07 MED ORDER — ATORVASTATIN CALCIUM 10 MG PO TABS
10.0000 mg | ORAL_TABLET | Freq: Every day | ORAL | Status: DC
  Administered 2013-06-07: 10 mg via ORAL
  Filled 2013-06-07 (×2): qty 1

## 2013-06-07 MED ORDER — HEMOSTATIC AGENTS (NO CHARGE) OPTIME
TOPICAL | Status: DC | PRN
  Administered 2013-06-07: 1 via TOPICAL

## 2013-06-07 MED ORDER — TESTOSTERONE 30 MG/ACT TD SOLN: 30.0000 mg | Freq: Four times a day (QID) | TRANSDERMAL | Status: DC

## 2013-06-07 MED ORDER — GLYCOPYRROLATE 0.2 MG/ML IJ SOLN: INTRAMUSCULAR | Status: DC | PRN

## 2013-06-07 MED ORDER — KCL IN DEXTROSE-NACL 20-5-0.45 MEQ/L-%-% IV SOLN
INTRAVENOUS | Status: DC
  Filled 2013-06-07 (×4): qty 1000

## 2013-06-07 MED ORDER — TAMSULOSIN HCL 0.4 MG PO CAPS
0.4000 mg | ORAL_CAPSULE | Freq: Every day | ORAL | Status: DC
  Administered 2013-06-07: 0.4 mg via ORAL
  Filled 2013-06-07 (×2): qty 1

## 2013-06-07 MED ORDER — ROCURONIUM BROMIDE 100 MG/10ML IV SOLN
INTRAVENOUS | Status: DC | PRN
  Administered 2013-06-07: 10 mg via INTRAVENOUS
  Administered 2013-06-07: 50 mg via INTRAVENOUS

## 2013-06-07 MED ORDER — ONDANSETRON HCL 4 MG/2ML IJ SOLN: 4.0000 mg | INTRAMUSCULAR | Status: DC | PRN

## 2013-06-07 MED ORDER — DOCUSATE SODIUM 100 MG PO CAPS
100.0000 mg | ORAL_CAPSULE | Freq: Two times a day (BID) | ORAL | Status: DC
  Administered 2013-06-07 – 2013-06-08 (×2): 100 mg via ORAL
  Filled 2013-06-07 (×2): qty 1

## 2013-06-07 MED ORDER — OXYCODONE HCL 5 MG/5ML PO SOLN: 5.0000 mg | Freq: Once | ORAL | Status: AC | PRN

## 2013-06-07 MED ORDER — SODIUM CHLORIDE 0.9 % IV SOLN: 250.0000 mL | INTRAVENOUS | Status: DC

## 2013-06-07 MED ORDER — 0.9 % SODIUM CHLORIDE (POUR BTL) OPTIME
TOPICAL | Status: DC | PRN
  Administered 2013-06-07: 1000 mL

## 2013-06-07 MED ORDER — PANTOPRAZOLE SODIUM 40 MG PO TBEC
40.0000 mg | DELAYED_RELEASE_TABLET | Freq: Every day | ORAL | Status: DC
  Administered 2013-06-07: 40 mg via ORAL
  Filled 2013-06-07: qty 1

## 2013-06-07 MED ORDER — DIAZEPAM 5 MG PO TABS
5.0000 mg | ORAL_TABLET | Freq: Four times a day (QID) | ORAL | Status: DC | PRN
  Administered 2013-06-07 – 2013-06-08 (×2): 5 mg via ORAL
  Filled 2013-06-07: qty 1

## 2013-06-07 MED ORDER — LOSARTAN POTASSIUM 50 MG PO TABS
100.0000 mg | ORAL_TABLET | Freq: Every day | ORAL | Status: DC
  Administered 2013-06-07 – 2013-06-08 (×2): 100 mg via ORAL
  Filled 2013-06-07 (×2): qty 2

## 2013-06-07 MED ORDER — SODIUM CHLORIDE 0.9 % IJ SOLN
3.0000 mL | Freq: Two times a day (BID) | INTRAMUSCULAR | Status: DC
  Administered 2013-06-07: 3 mL via INTRAVENOUS

## 2013-06-07 MED ORDER — CALCIUM CARBONATE 600 MG PO TABS: 600.0000 mg | ORAL_TABLET | Freq: Two times a day (BID) | ORAL | Status: DC

## 2013-06-07 MED ORDER — LIDOCAINE 5 % EX PTCH
1.0000 | MEDICATED_PATCH | Freq: Every day | CUTANEOUS | Status: DC | PRN
  Filled 2013-06-07: qty 1

## 2013-06-07 MED ORDER — VITAMIN D 50 MCG (2000 UT) PO TABS: 2000.0000 [IU] | ORAL_TABLET | Freq: Every day | ORAL | Status: DC

## 2013-06-07 MED ORDER — CALCIUM CARBONATE 1250 (500 CA) MG PO TABS
1.0000 | ORAL_TABLET | Freq: Two times a day (BID) | ORAL | Status: DC
  Administered 2013-06-07 – 2013-06-08 (×2): 500 mg via ORAL
  Filled 2013-06-07 (×4): qty 1

## 2013-06-07 MED ORDER — MEPERIDINE HCL 25 MG/ML IJ SOLN: 6.2500 mg | INTRAMUSCULAR | Status: DC | PRN

## 2013-06-07 MED ORDER — PHENYLEPHRINE HCL 10 MG/ML IJ SOLN
10.0000 mg | INTRAVENOUS | Status: DC | PRN
  Administered 2013-06-07: 20 ug/min via INTRAVENOUS

## 2013-06-07 MED ORDER — PHENOL 1.4 % MT LIQD: 1.0000 | OROMUCOSAL | Status: DC | PRN

## 2013-06-07 MED ORDER — ETODOLAC 400 MG PO TABS
400.0000 mg | ORAL_TABLET | Freq: Two times a day (BID) | ORAL | Status: DC
  Administered 2013-06-07 – 2013-06-08 (×2): 400 mg via ORAL
  Filled 2013-06-07 (×4): qty 1

## 2013-06-07 MED ORDER — HYDROCORTISONE SOD SUCCINATE 100 MG IJ SOLR
INTRAMUSCULAR | Status: DC | PRN
  Administered 2013-06-07: 100 mg via INTRAVENOUS

## 2013-06-07 MED ORDER — HYDROCODONE-ACETAMINOPHEN 5-325 MG PO TABS: 1.0000 | ORAL_TABLET | Freq: Four times a day (QID) | ORAL | Status: DC | PRN

## 2013-06-07 MED ORDER — PANTOPRAZOLE SODIUM 40 MG IV SOLR
40.0000 mg | Freq: Every day | INTRAVENOUS | Status: DC
  Filled 2013-06-07: qty 40

## 2013-06-07 MED ORDER — VITAMIN B-1 100 MG PO TABS
100.0000 mg | ORAL_TABLET | Freq: Every day | ORAL | Status: DC
Start: 2013-06-08 — End: 2013-06-08
  Administered 2013-06-08: 12:00:00 100 mg via ORAL
  Filled 2013-06-07: qty 1

## 2013-06-07 MED ORDER — MIDAZOLAM HCL 2 MG/2ML IJ SOLN: 0.5000 mg | Freq: Once | INTRAMUSCULAR | Status: DC | PRN

## 2013-06-07 MED ORDER — BISACODYL 10 MG RE SUPP: 10.0000 mg | Freq: Every day | RECTAL | Status: DC | PRN

## 2013-06-07 MED ORDER — ACETAMINOPHEN 650 MG RE SUPP: 650.0000 mg | RECTAL | Status: DC | PRN

## 2013-06-07 MED ORDER — HYDROMORPHONE HCL PF 1 MG/ML IJ SOLN
0.2500 mg | INTRAMUSCULAR | Status: DC | PRN
  Administered 2013-06-07 (×4): 0.5 mg via INTRAVENOUS

## 2013-06-07 MED ORDER — NEOSTIGMINE METHYLSULFATE 1 MG/ML IJ SOLN
INTRAMUSCULAR | Status: DC | PRN
  Administered 2013-06-07: 4 mg via INTRAVENOUS

## 2013-06-07 MED ORDER — DEXAMETHASONE SODIUM PHOSPHATE 4 MG/ML IJ SOLN
INTRAMUSCULAR | Status: DC | PRN
  Administered 2013-06-07: 10 mg via INTRAVENOUS

## 2013-06-07 MED ORDER — DESMOPRESSIN ACETATE 0.2 MG PO TABS
0.4000 mg | ORAL_TABLET | Freq: Three times a day (TID) | ORAL | Status: DC
  Administered 2013-06-07 – 2013-06-08 (×3): 0.4 mg via ORAL
  Filled 2013-06-07 (×8): qty 2

## 2013-06-07 MED ORDER — EPHEDRINE SULFATE 50 MG/ML IJ SOLN
INTRAMUSCULAR | Status: DC | PRN
  Administered 2013-06-07 (×2): 10 mg via INTRAVENOUS

## 2013-06-07 MED ORDER — PROMETHAZINE HCL 25 MG/ML IJ SOLN: 6.2500 mg | INTRAMUSCULAR | Status: DC | PRN

## 2013-06-07 MED ORDER — CYCLOBENZAPRINE HCL 10 MG PO TABS: 10.0000 mg | ORAL_TABLET | Freq: Three times a day (TID) | ORAL | Status: DC | PRN

## 2013-06-07 MED ORDER — LEVOTHYROXINE SODIUM 150 MCG PO TABS
150.0000 ug | ORAL_TABLET | Freq: Every day | ORAL | Status: DC
  Administered 2013-06-08: 150 ug via ORAL
  Filled 2013-06-07 (×2): qty 1

## 2013-06-07 MED ORDER — BUPIVACAINE HCL (PF) 0.5 % IJ SOLN
INTRAMUSCULAR | Status: DC | PRN
  Administered 2013-06-07: 10 mL

## 2013-06-07 MED ORDER — VECURONIUM BROMIDE 10 MG IV SOLR
INTRAVENOUS | Status: DC | PRN
  Administered 2013-06-07: 1 mg via INTRAVENOUS
  Administered 2013-06-07 (×3): 2 mg via INTRAVENOUS
  Administered 2013-06-07: 3 mg via INTRAVENOUS

## 2013-06-07 MED ORDER — LACTATED RINGERS IV SOLN
INTRAVENOUS | Status: DC | PRN
  Administered 2013-06-07 (×3): via INTRAVENOUS

## 2013-06-07 MED ORDER — MENTHOL 3 MG MT LOZG
1.0000 | LOZENGE | OROMUCOSAL | Status: DC | PRN
  Filled 2013-06-07: qty 9

## 2013-06-07 MED ORDER — ALUM & MAG HYDROXIDE-SIMETH 200-200-20 MG/5ML PO SUSP: 30.0000 mL | Freq: Four times a day (QID) | ORAL | Status: DC | PRN

## 2013-06-07 MED ORDER — FLEET ENEMA 7-19 GM/118ML RE ENEM
1.0000 | ENEMA | Freq: Once | RECTAL | Status: AC | PRN
  Filled 2013-06-07: qty 1

## 2013-06-07 MED ORDER — VITAMIN D3 25 MCG (1000 UNIT) PO TABS
2000.0000 [IU] | ORAL_TABLET | Freq: Every day | ORAL | Status: DC
  Administered 2013-06-08: 2000 [IU] via ORAL
  Filled 2013-06-07: qty 2

## 2013-06-07 MED ORDER — ZOLPIDEM TARTRATE 5 MG PO TABS: 5.0000 mg | ORAL_TABLET | Freq: Every evening | ORAL | Status: DC | PRN

## 2013-06-07 MED ORDER — ASPIRIN EC 81 MG PO TBEC
81.0000 mg | DELAYED_RELEASE_TABLET | Freq: Every day | ORAL | Status: DC
  Administered 2013-06-08: 81 mg via ORAL
  Filled 2013-06-07: qty 1

## 2013-06-07 MED ORDER — PSYLLIUM 95 % PO PACK
1.0000 | PACK | Freq: Every day | ORAL | Status: DC
  Administered 2013-06-08: 1 via ORAL
  Filled 2013-06-07: qty 1

## 2013-06-07 MED ORDER — BACITRACIN ZINC 500 UNIT/GM EX OINT
TOPICAL_OINTMENT | CUTANEOUS | Status: DC | PRN
  Administered 2013-06-07: 1 via TOPICAL

## 2013-06-07 MED ORDER — DULOXETINE HCL 60 MG PO CPEP
60.0000 mg | ORAL_CAPSULE | Freq: Every day | ORAL | Status: DC
  Administered 2013-06-08: 60 mg via ORAL
  Filled 2013-06-07: qty 1

## 2013-06-07 MED ORDER — GLYCOPYRROLATE 0.2 MG/ML IJ SOLN
INTRAMUSCULAR | Status: DC | PRN
  Administered 2013-06-07: 0.6 mg via INTRAVENOUS
  Administered 2013-06-07: 0.2 mg via INTRAVENOUS

## 2013-06-07 MED ORDER — ACETAMINOPHEN-CODEINE #3 300-30 MG PO TABS: 1.0000 | ORAL_TABLET | ORAL | Status: DC | PRN

## 2013-06-07 MED ORDER — TRAZODONE HCL 100 MG PO TABS
100.0000 mg | ORAL_TABLET | Freq: Every day | ORAL | Status: DC
  Administered 2013-06-07: 100 mg via ORAL
  Filled 2013-06-07 (×2): qty 1

## 2013-06-07 MED ORDER — MORPHINE SULFATE 2 MG/ML IJ SOLN: 1.0000 mg | INTRAMUSCULAR | Status: DC | PRN

## 2013-06-07 MED ORDER — CEFAZOLIN SODIUM 1-5 GM-% IV SOLN
1.0000 g | Freq: Three times a day (TID) | INTRAVENOUS | Status: AC
  Administered 2013-06-07 (×2): 1 g via INTRAVENOUS
  Filled 2013-06-07 (×2): qty 50

## 2013-06-07 MED ORDER — HYDROCORTISONE 10 MG PO TABS
10.0000 mg | ORAL_TABLET | Freq: Every day | ORAL | Status: DC
  Administered 2013-06-08: 10 mg via ORAL
  Filled 2013-06-07: qty 1

## 2013-06-07 MED ORDER — SENNA 8.6 MG PO TABS
1.0000 | ORAL_TABLET | Freq: Two times a day (BID) | ORAL | Status: DC
  Administered 2013-06-07 – 2013-06-08 (×2): 8.6 mg via ORAL
  Filled 2013-06-07 (×3): qty 1

## 2013-06-07 MED ORDER — OXYCODONE HCL 5 MG PO TABS
ORAL_TABLET | ORAL | Status: AC
Start: 2013-06-07 — End: 2013-06-08
  Filled 2013-06-07: qty 1

## 2013-06-07 MED ORDER — HYDROMORPHONE HCL PF 1 MG/ML IJ SOLN
INTRAMUSCULAR | Status: AC
Start: 2013-06-07 — End: 2013-06-08
  Filled 2013-06-07: qty 1

## 2013-06-07 MED ORDER — ONDANSETRON HCL 4 MG/2ML IJ SOLN
INTRAMUSCULAR | Status: DC | PRN
  Administered 2013-06-07: 4 mg via INTRAMUSCULAR

## 2013-06-07 MED ORDER — MECLIZINE HCL 25 MG PO TABS
25.0000 mg | ORAL_TABLET | Freq: Three times a day (TID) | ORAL | Status: DC | PRN
  Filled 2013-06-07: qty 1

## 2013-06-07 MED ORDER — POLYETHYLENE GLYCOL 3350 17 G PO PACK
17.0000 g | PACK | Freq: Every day | ORAL | Status: DC | PRN
  Filled 2013-06-07: qty 1

## 2013-06-07 MED ORDER — HYDROCODONE-ACETAMINOPHEN 5-325 MG PO TABS: 1.0000 | ORAL_TABLET | ORAL | Status: DC | PRN

## 2013-06-07 MED ORDER — ASPIRIN 81 MG PO TABS: 81.0000 mg | ORAL_TABLET | Freq: Every day | ORAL | Status: DC

## 2013-06-07 MED ORDER — THROMBIN 5000 UNITS EX SOLR
CUTANEOUS | Status: DC | PRN
  Administered 2013-06-07 (×2): 5000 [IU] via TOPICAL

## 2013-06-07 MED ORDER — LIDOCAINE HCL (CARDIAC) 20 MG/ML IV SOLN
INTRAVENOUS | Status: DC | PRN
  Administered 2013-06-07: 40 mg via INTRAVENOUS

## 2013-06-07 MED ORDER — VITAMIN C 500 MG PO TABS
500.0000 mg | ORAL_TABLET | Freq: Two times a day (BID) | ORAL | Status: DC
  Administered 2013-06-07 – 2013-06-08 (×2): 500 mg via ORAL
  Filled 2013-06-07 (×3): qty 1

## 2013-06-07 SURGICAL SUPPLY — 97 items
BAG DECANTER FOR FLEXI CONT (MISCELLANEOUS) ×3 IMPLANT
BANDAGE GAUZE 4  KLING STR (GAUZE/BANDAGES/DRESSINGS) ×3 IMPLANT
BANDAGE GAUZE ELAST BULKY 4 IN (GAUZE/BANDAGES/DRESSINGS) ×3 IMPLANT
BENZOIN TINCTURE PRP APPL 2/3 (GAUZE/BANDAGES/DRESSINGS) ×3 IMPLANT
BIT DRILL NEURO 2X3.1 SFT TUCH (MISCELLANEOUS) IMPLANT
BLADE SURG 10 STRL SS (BLADE) ×3 IMPLANT
BLADE SURG 11 STRL SS (BLADE) ×3 IMPLANT
BLADE SURG 15 STRL LF DISP TIS (BLADE) ×2 IMPLANT
BLADE SURG 15 STRL SS (BLADE) ×1
BLADE SURG ROTATE 9660 (MISCELLANEOUS) IMPLANT
BLADE ULTRA TIP 2M (BLADE) IMPLANT
BRUSH SCRUB EZ PLAIN DRY (MISCELLANEOUS) ×6 IMPLANT
BUR PRECISION FLUTE 5.0 (BURR) IMPLANT
CANISTER SUCTION 2500CC (MISCELLANEOUS) ×6 IMPLANT
CLOTH BEACON ORANGE TIMEOUT ST (SAFETY) ×6 IMPLANT
CONT SPEC 4OZ CLIKSEAL STRL BL (MISCELLANEOUS) ×3 IMPLANT
CORDS BIPOLAR (ELECTRODE) ×3 IMPLANT
DRAPE C-ARM 42X72 X-RAY (DRAPES) ×6 IMPLANT
DRAPE EXTREMITY T 121X128X90 (DRAPE) ×3 IMPLANT
DRAPE LAPAROTOMY 100X72 PEDS (DRAPES) ×3 IMPLANT
DRAPE MICROSCOPE LEICA (MISCELLANEOUS) ×3 IMPLANT
DRAPE POUCH INSTRU U-SHP 10X18 (DRAPES) ×3 IMPLANT
DRAPE PROXIMA HALF (DRAPES) ×6 IMPLANT
DRESSING TELFA 8X3 (GAUZE/BANDAGES/DRESSINGS) ×3 IMPLANT
DRILL 12MM (DRILL) ×3 IMPLANT
DRILL NEURO 2X3.1 SOFT TOUCH (MISCELLANEOUS)
DRSG PAD ABDOMINAL 8X10 ST (GAUZE/BANDAGES/DRESSINGS) ×6 IMPLANT
DURAPREP 6ML APPLICATOR 50/CS (WOUND CARE) ×3 IMPLANT
ELECT BLADE 4.0 EZ CLEAN MEGAD (MISCELLANEOUS) ×3
ELECT REM PT RETURN 9FT ADLT (ELECTROSURGICAL) ×3
ELECTRODE BLDE 4.0 EZ CLN MEGD (MISCELLANEOUS) ×2 IMPLANT
ELECTRODE REM PT RTRN 9FT ADLT (ELECTROSURGICAL) ×2 IMPLANT
EVACUATOR 1/8 PVC DRAIN (DRAIN) ×3 IMPLANT
GAUZE SPONGE 4X4 16PLY XRAY LF (GAUZE/BANDAGES/DRESSINGS) ×6 IMPLANT
GLOVE BIO SURGEON STRL SZ8 (GLOVE) ×6 IMPLANT
GLOVE BIOGEL PI IND STRL 8 (GLOVE) ×2 IMPLANT
GLOVE BIOGEL PI IND STRL 8.5 (GLOVE) ×4 IMPLANT
GLOVE BIOGEL PI INDICATOR 8 (GLOVE) ×1
GLOVE BIOGEL PI INDICATOR 8.5 (GLOVE) ×2
GLOVE ECLIPSE 8.0 STRL XLNG CF (GLOVE) ×3 IMPLANT
GLOVE ECLIPSE 8.5 STRL (GLOVE) ×3 IMPLANT
GLOVE EXAM NITRILE LRG STRL (GLOVE) ×6 IMPLANT
GLOVE EXAM NITRILE MD LF STRL (GLOVE) IMPLANT
GLOVE EXAM NITRILE XL STR (GLOVE) IMPLANT
GLOVE EXAM NITRILE XS STR PU (GLOVE) IMPLANT
GLOVE INDICATOR 8.0 STRL GRN (GLOVE) ×3 IMPLANT
GLOVE INDICATOR 8.5 STRL (GLOVE) ×6 IMPLANT
GLOVE SS N UNI LF 8.0 STRL (GLOVE) ×12 IMPLANT
GOWN BRE IMP SLV AUR LG STRL (GOWN DISPOSABLE) IMPLANT
GOWN BRE IMP SLV AUR XL STRL (GOWN DISPOSABLE) ×3 IMPLANT
GOWN STRL REIN 2XL LVL4 (GOWN DISPOSABLE) ×9 IMPLANT
HEMOSTAT SURGICEL 2X14 (HEMOSTASIS) IMPLANT
KIT BASIN OR (CUSTOM PROCEDURE TRAY) ×3 IMPLANT
KIT INFUSE X SMALL 1.4CC (Orthopedic Implant) ×3 IMPLANT
KIT ROOM TURNOVER OR (KITS) ×6 IMPLANT
LOCATOR NERVE 3 VOLT (DISPOSABLE) IMPLANT
LOOP VESSEL MAXI BLUE (MISCELLANEOUS) ×3 IMPLANT
MARKER SKIN DUAL TIP RULER LAB (MISCELLANEOUS) ×3 IMPLANT
NEEDLE HYPO 18GX1.5 BLUNT FILL (NEEDLE) IMPLANT
NEEDLE HYPO 25X1 1.5 SAFETY (NEEDLE) ×6 IMPLANT
NEEDLE SPNL 22GX3.5 QUINCKE BK (NEEDLE) ×3 IMPLANT
NS IRRIG 1000ML POUR BTL (IV SOLUTION) ×3 IMPLANT
PACK LAMINECTOMY NEURO (CUSTOM PROCEDURE TRAY) ×3 IMPLANT
PACK SURGICAL SETUP 50X90 (CUSTOM PROCEDURE TRAY) ×3 IMPLANT
PAD ARMBOARD 7.5X6 YLW CONV (MISCELLANEOUS) ×9 IMPLANT
PENCIL BUTTON HOLSTER BLD 10FT (ELECTRODE) IMPLANT
PIN MAYFIELD SKULL DISP (PIN) ×3 IMPLANT
ROD 120MM (Rod) ×1 IMPLANT
ROD SPNL 120X3.3XNS LF CVD (Rod) ×2 IMPLANT
RUBBERBAND STERILE (MISCELLANEOUS) ×6 IMPLANT
SCREW 20MM (Screw) ×6 IMPLANT
SCREW POLYAXIAL 3.5X10MM (Screw) ×12 IMPLANT
SCREW SET (Screw) ×18 IMPLANT
SPONGE GAUZE 4X4 12PLY (GAUZE/BANDAGES/DRESSINGS) ×3 IMPLANT
SPONGE INTESTINAL PEANUT (DISPOSABLE) IMPLANT
SPONGE SURGIFOAM ABS GEL SZ50 (HEMOSTASIS) ×3 IMPLANT
STAPLER SKIN PROX WIDE 3.9 (STAPLE) ×3 IMPLANT
STOCKINETTE 4X48 STRL (DRAPES) ×3 IMPLANT
STRIP CLOSURE SKIN 1/2X4 (GAUZE/BANDAGES/DRESSINGS) ×6 IMPLANT
STRIP NEXOSS 5CC (Neuro Prosthesis/Implant) ×3 IMPLANT
SUT ETHILON 3 0 FSL (SUTURE) ×3 IMPLANT
SUT ETHILON 3 0 PS 1 (SUTURE) IMPLANT
SUT SILK 2 0 FS (SUTURE) IMPLANT
SUT VIC AB 0 CT1 18XCR BRD8 (SUTURE) ×2 IMPLANT
SUT VIC AB 0 CT1 8-18 (SUTURE) ×1
SUT VIC AB 2-0 CP2 18 (SUTURE) ×9 IMPLANT
SUT VIC AB 3-0 SH 8-18 (SUTURE) ×6 IMPLANT
SYR 20ML ECCENTRIC (SYRINGE) ×3 IMPLANT
SYR 3ML LL SCALE MARK (SYRINGE) IMPLANT
SYR BULB 3OZ (MISCELLANEOUS) ×3 IMPLANT
SYR CONTROL 10ML LL (SYRINGE) ×3 IMPLANT
TOWEL OR 17X24 6PK STRL BLUE (TOWEL DISPOSABLE) ×6 IMPLANT
TOWEL OR 17X26 10 PK STRL BLUE (TOWEL DISPOSABLE) ×6 IMPLANT
TRAP SPECIMEN MUCOUS 40CC (MISCELLANEOUS) ×3 IMPLANT
TUBE CONNECTING 12X1/4 (SUCTIONS) ×3 IMPLANT
UNDERPAD 30X30 INCONTINENT (UNDERPADS AND DIAPERS) ×3 IMPLANT
WATER STERILE IRR 1000ML POUR (IV SOLUTION) ×6 IMPLANT

## 2013-06-07 NOTE — Anesthesia Procedure Notes (Signed)
Procedure Name: Intubation Date/Time: 06/07/2013 7:57 AM Performed by: Gayla Medicus Pre-anesthesia Checklist: Patient identified, Timeout performed, Emergency Drugs available, Suction available and Patient being monitored Patient Re-evaluated:Patient Re-evaluated prior to inductionOxygen Delivery Method: Circle system utilized Preoxygenation: Pre-oxygenation with 100% oxygen Intubation Type: IV induction Ventilation: Mask ventilation without difficulty, Two handed mask ventilation required and Oral airway inserted - appropriate to patient size Laryngoscope Size: Mac and 4 Grade View: Grade I Tube type: Oral Tube size: 7.5 mm Number of attempts: 1 Airway Equipment and Method: Stylet and Video-laryngoscopy Placement Confirmation: ETT inserted through vocal cords under direct vision,  positive ETCO2 and breath sounds checked- equal and bilateral Secured at: 23 cm Tube secured with: Tape Dental Injury: Teeth and Oropharynx as per pre-operative assessment  Difficulty Due To: Difficulty was anticipated

## 2013-06-07 NOTE — Progress Notes (Signed)
Subjective: Patient reports "no tingling anymore...less numbness in my little finger" "just sore in my shoulders"  Objective: Vital signs in last 24 hours: Temp:  [96.8 F (36 C)-98.6 F (37 C)] 98.6 F (37 C) (10/07 1623) Pulse Rate:  [77-102] 102 (10/07 1623) Resp:  [12-24] 18 (10/07 1623) BP: (147-165)/(76-93) 159/76 mmHg (10/07 1623) SpO2:  [96 %-100 %] 96 % (10/07 1623)  Intake/Output from previous day:   Intake/Output this shift: Total I/O In: 2730 [P.O.:480; I.V.:2000; IV Piggyback:250] Out: 2730 [Urine:2405; Drains:25; Blood:300]  Alert, smiling. Rt elbow drsg intact, dry. Tingling resolved right hand, decreased numbness right hand 5th digit.  Posterior cervical drsg replaced upon transfer to 3500 d/t bloody drainage soaking drsg. New drsg intact with small area blood visible. Good strength & motion BUE. Hemovac patent.  Lab Results: No results found for this basename: WBC, HGB, HCT, PLT,  in the last 72 hours BMET No results found for this basename: NA, K, CL, CO2, GLUCOSE, BUN, CREATININE, CALCIUM,  in the last 72 hours  Studies/Results: Dg Cervical Spine 1 View  06/07/2013   CLINICAL DATA:  Exploration of posterior cervical spine C6-C7 fusion.  EXAM: DG CERVICAL SPINE - 1 VIEW  COMPARISON:  02/14/2013  FINDINGS: Single AP view shows an anterior fusion plate and screws extending from C4 through C7. Pedicle screws are noted, 3 on each side, at the thoracolumbar junction, which appear to reside in the C6, C7 and T1 pedicles.  IMPRESSION: Single portable operative view of the cervical spine as described.   Electronically Signed   By: Amie Portland M.D.   On: 06/07/2013 10:05    Assessment/Plan: Improving.   LOS: 0 days  Maintain DSD (pressure drsg)to posterior cervical incision, changing if saturated. Continue Vista Collar. Mobilize pt as tolerated.   Georgiann Cocker 06/07/2013, 4:57 PM  3

## 2013-06-07 NOTE — Transfer of Care (Signed)
Immediate Anesthesia Transfer of Care Note  Patient: Troy Collier  Procedure(s) Performed: Procedure(s): Exploration of posterior Cervical six-seven Fusion with Cervical seven thoracic-one posterior cervical decompression/fusion (N/A) ULNAR NERVE DECOMPRESSION/TRANSPOSITION (Right)  Patient Location: PACU  Anesthesia Type:General  Level of Consciousness: awake, alert  and oriented  Airway & Oxygen Therapy: Patient Spontanous Breathing and Patient connected to nasal cannula oxygen  Post-op Assessment: Report given to PACU RN, Post -op Vital signs reviewed and stable and Patient moving all extremities X 4  Post vital signs: Reviewed and stable  Complications: No apparent anesthesia complications

## 2013-06-07 NOTE — Anesthesia Preprocedure Evaluation (Addendum)
Anesthesia Evaluation  Patient identified by MRN, date of birth, ID band  History of Anesthesia Complications Negative for: history of anesthetic complications  Airway Mallampati: IV TM Distance: <3 FB Neck ROM: Full    Dental  (+) Partial Upper and Dental Advisory Given   Pulmonary Sleep apnea: no CPAP needednever formally tested for OSA, snores. ,  breath sounds clear to auscultation  Pulmonary exam normal       Cardiovascular hypertension, Pt. on medications - anginaRhythm:Regular Rate:Normal     Neuro/Psych Anxiety Neck pain: narcotics    GI/Hepatic negative GI ROS, Neg liver ROS,   Endo/Other  Morbid obesitypanhypopit s/p hypophysectomy: on DDAVP, thyroid and steroid replacement  Renal/GU negative Renal ROS     Musculoskeletal  (+) Fibromyalgia -  Abdominal (+) + obese,   Peds  Hematology   Anesthesia Other Findings   Reproductive/Obstetrics                           Anesthesia Physical Anesthesia Plan  ASA: III  Anesthesia Plan: General   Post-op Pain Management:    Induction: Intravenous  Airway Management Planned: Oral ETT and Video Laryngoscope Planned  Additional Equipment:   Intra-op Plan:   Post-operative Plan: Extubation in OR  Informed Consent: I have reviewed the patients History and Physical, chart, labs and discussed the procedure including the risks, benefits and alternatives for the proposed anesthesia with the patient or authorized representative who has indicated his/her understanding and acceptance.   Dental advisory given  Plan Discussed with: CRNA and Surgeon  Anesthesia Plan Comments: (Plan routine monitors, GETA with VideoGlide intubation)        Anesthesia Quick Evaluation

## 2013-06-07 NOTE — Brief Op Note (Signed)
06/07/2013  11:38 AM  PATIENT:  Troy Collier  67 y.o. 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, Cervical spondylosis, Ulnar neuropathy, neck pain  POST-OPERATIVE DIAGNOSIS:  Cervical radiculopathy, Cervical spondylolisthesis C 7 / T 1, Cervical spondylosis, Ulnar neuropathy, neck pain  PROCEDURE:  Procedure(s): Exploration of posterior Cervical six-seven Fusion with Cervical seven thoracic-one posterior cervical decompression/fusion (N/A) with posterior C 7 / T 1 foraminotomy and resection of synovial cyst with microdissection, lateral mass screws C 6 and C 7 with pedicle screws T 1 (segmental instrumentation) with posterolateral arthrodesis C 6 - T 1 levels ULNAR NERVE DECOMPRESSION/TRANSPOSITION (Right)  SURGEON:  Surgeon(s) and Role:    * Maeola Harman, MD - Primary    * Barnett Abu, MD - Assisting  PHYSICIAN ASSISTANT:   ASSISTANTS: Poteat, RN   ANESTHESIA:   general  EBL:  Total I/O In: 2250 [I.V.:2000; IV Piggyback:250] Out: 780 [Urine:480; Blood:300]  BLOOD ADMINISTERED:none  DRAINS: (Medium) Hemovact drain(s) in the epidural space with  Suction Open   LOCAL MEDICATIONS USED:  LIDOCAINE   SPECIMEN:  No Specimen  DISPOSITION OF SPECIMEN:  N/A  COUNTS:  YES  TOURNIQUET:  * No tourniquets in log *  DICTATION: DICTATION: Patient is 67 year old man with neck pain, spondylolisthesis C 7 / T 1 levels  and right ulnar neuropathy, s/p ACDF C 66, C 56, C 67 with incomplete healing at C 67 and chronic pain.  It was elected to take the patient to surgery for posterior cervical fusion and right C 7 / T 1 foraminotomy and right ulnar nerve release.  PROCEDURE: Patient was brought to the OR and following smooth and uncomplicated induction of GETA, patient was placed in 3 pin fixation and rolled into a prone position on the OR table.  Shoulders were taped to facilitate Xray visualization. Posterior neck was prepped and draped in  the usual fashion.  Area of planned incision was infiltrated with local lidocaine.  Incision was made from C 5 - T 1 and carried through the avascular midline plane to expose these lavels and their lateral masses.  There did not appear to be solid fusion at the previously operated level at C 67.  Lateral mass screws were placed from C 6 to C 7 bilaterally according to standard landmarks and their positioning was confirmed with fluoroscopy and then T 1 pedicle screws were placed bilaterally.  The facet joints and laminae were decorticated and packed with extra small BMP and NexOss bone graft extender on the left.  On the right, C 7 / T 1 foraminotomy was done with the high speed drill and Kerrison rongeurs under microdissection.  A synovial cyst off the facet joint was identified which was causing significant compression of the right C 78 nerve root.  The cyst was mobilized under microdissection technique and the nerve root was widely decompressed. Screws and rods were locked down in situ.  A medium Hemovac drain was placed.  Wound was closed with 0, 2-0, and 3-0 vicryl sutures.  Sterile occlusive dressing was placed.  Patient was taken out of pins and turned back onto the OR table.  His right arm was then prepped with betadine scrub and paint and then draped with an extremity drape after a sterile stockinet.  His right elbow and medial epicondyle were then marked and infiltrated with lidocaine.  A 5 cm incision was made and carried through the skin and subcutaneous tissues and under Loupe magnification,  the right ulnar nerve was identified and thoroughly decompressed as it coursed around the medial epicondyle, then more proximally into the arm (the intermuscular septum was incised) and more distally into the forearm ad the nerve passed between the two heads of FCU.  The nerve did not sublux with range of the elbow.  The wound was irrigated and then closed with 2-0 and 3-0 vicryl sutures.  Patient was extubated in  the operating room and taken to recovery in stable and satisfactory condition.  Counts were correct at the end of the case.  PLAN OF CARE: Admit for overnight observation  PATIENT DISPOSITION:  PACU - hemodynamically stable.   Delay start of Pharmacological VTE agent (>24hrs) due to surgical blood loss or risk of bleeding: yes

## 2013-06-07 NOTE — Op Note (Signed)
06/07/2013  11:38 AM  PATIENT:  Troy Collier  66 y.o. 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, Cervical spondylosis, Ulnar neuropathy, neck pain  POST-OPERATIVE DIAGNOSIS:  Cervical radiculopathy, Cervical spondylolisthesis C 7 / T 1, Cervical spondylosis, Ulnar neuropathy, neck pain  PROCEDURE:  Procedure(s): Exploration of posterior Cervical six-seven Fusion with Cervical seven thoracic-one posterior cervical decompression/fusion (N/A) with posterior C 7 / T 1 foraminotomy and resection of synovial cyst with microdissection, lateral mass screws C 6 and C 7 with pedicle screws T 1 (segmental instrumentation) with posterolateral arthrodesis C 6 - T 1 levels ULNAR NERVE DECOMPRESSION/TRANSPOSITION (Right)  SURGEON:  Surgeon(s) and Role:    * Courteney Alderete, MD - Primary    * Henry Elsner, MD - Assisting  PHYSICIAN ASSISTANT:   ASSISTANTS: Poteat, RN   ANESTHESIA:   general  EBL:  Total I/O In: 2250 [I.V.:2000; IV Piggyback:250] Out: 780 [Urine:480; Blood:300]  BLOOD ADMINISTERED:none  DRAINS: (Medium) Hemovact drain(s) in the epidural space with  Suction Open   LOCAL MEDICATIONS USED:  LIDOCAINE   SPECIMEN:  No Specimen  DISPOSITION OF SPECIMEN:  N/A  COUNTS:  YES  TOURNIQUET:  * No tourniquets in log *  DICTATION: DICTATION: Patient is 66 year old man with neck pain, spondylolisthesis C 7 / T 1 levels  and right ulnar neuropathy, s/p ACDF C 45, C 56, C 67 with incomplete healing at C 67 and chronic pain.  It was elected to take the patient to surgery for posterior cervical fusion and right C 7 / T 1 foraminotomy and right ulnar nerve release.  PROCEDURE: Patient was brought to the OR and following smooth and uncomplicated induction of GETA, patient was placed in 3 pin fixation and rolled into a prone position on the OR table.  Shoulders were taped to facilitate Xray visualization. Posterior neck was prepped and draped in  the usual fashion.  Area of planned incision was infiltrated with local lidocaine.  Incision was made from C 5 - T 1 and carried through the avascular midline plane to expose these lavels and their lateral masses.  There did not appear to be solid fusion at the previously operated level at C 67.  Lateral mass screws were placed from C 6 to C 7 bilaterally according to standard landmarks and their positioning was confirmed with fluoroscopy and then T 1 pedicle screws were placed bilaterally.  The facet joints and laminae were decorticated and packed with extra small BMP and NexOss bone graft extender on the left.  On the right, C 7 / T 1 foraminotomy was done with the high speed drill and Kerrison rongeurs under microdissection.  A synovial cyst off the facet joint was identified which was causing significant compression of the right C 78 nerve root.  The cyst was mobilized under microdissection technique and the nerve root was widely decompressed. Screws and rods were locked down in situ.  A medium Hemovac drain was placed.  Wound was closed with 0, 2-0, and 3-0 vicryl sutures.  Sterile occlusive dressing was placed.  Patient was taken out of pins and turned back onto the OR table.  His right arm was then prepped with betadine scrub and paint and then draped with an extremity drape after a sterile stockinet.  His right elbow and medial epicondyle were then marked and infiltrated with lidocaine.  A 5 cm incision was made and carried through the skin and subcutaneous tissues and under Loupe magnification,   the right ulnar nerve was identified and thoroughly decompressed as it coursed around the medial epicondyle, then more proximally into the arm (the intermuscular septum was incised) and more distally into the forearm ad the nerve passed between the two heads of FCU.  The nerve did not sublux with range of the elbow.  The wound was irrigated and then closed with 2-0 and 3-0 vicryl sutures.  Patient was extubated in  the operating room and taken to recovery in stable and satisfactory condition.  Counts were correct at the end of the case.  PLAN OF CARE: Admit for overnight observation  PATIENT DISPOSITION:  PACU - hemodynamically stable.   Delay start of Pharmacological VTE agent (>24hrs) due to surgical blood loss or risk of bleeding: yes  

## 2013-06-07 NOTE — Interval H&P Note (Signed)
History and Physical Interval Note:  06/07/2013 7:55 AM  Troy Collier  has presented today for surgery, with the diagnosis of Cervical radiculopathy, Cervical hnp, Cervical spondylosis, Ulnar neuropathy  The various methods of treatment have been discussed with the patient and family. After consideration of risks, benefits and other options for treatment, the patient has consented to  Procedure(s): Exploration of C6-7 Fusion with C7-T1 posterior cervical decompression/fusion (N/A) ULNAR NERVE DECOMPRESSION/TRANSPOSITION (Right) as a surgical intervention .  The patient's history has been reviewed, patient examined, no change in status, stable for surgery.  I have reviewed the patient's chart and labs.  Questions were answered to the patient's satisfaction.     Rhegan Trunnell D

## 2013-06-07 NOTE — OR Nursing (Signed)
Procedure one stop time 1032 patient reposition for procedure two

## 2013-06-07 NOTE — Anesthesia Postprocedure Evaluation (Signed)
  Anesthesia Post-op Note  Patient: Troy Collier  Procedure(s) Performed: Procedure(s): Exploration of posterior Cervical six-seven Fusion with Cervical seven thoracic-one posterior cervical decompression/fusion (N/A) ULNAR NERVE DECOMPRESSION/TRANSPOSITION (Right)  Patient Location: PACU  Anesthesia Type:General  Level of Consciousness: awake, alert , oriented and patient cooperative  Airway and Oxygen Therapy: Patient Spontanous Breathing and Patient connected to nasal cannula oxygen  Post-op Pain: mild  Post-op Assessment: Post-op Vital signs reviewed, Patient's Cardiovascular Status Stable, Respiratory Function Stable, Patent Airway, No signs of Nausea or vomiting and Pain level controlled  Post-op Vital Signs: Reviewed and stable  Complications: No apparent anesthesia complications

## 2013-06-07 NOTE — Progress Notes (Signed)
Awake, alert, conversant.  Bilateral HI strength full, much improved on the right, no numbness.  Doing well.

## 2013-06-07 NOTE — Preoperative (Signed)
Beta Blockers   Reason not to administer Beta Blockers:Not Applicable 

## 2013-06-08 LAB — GLUCOSE, CAPILLARY: Glucose-Capillary: 97 mg/dL (ref 70–99)

## 2013-06-08 MED ORDER — DIAZEPAM 5 MG PO TABS: 5.0000 mg | ORAL_TABLET | Freq: Four times a day (QID) | ORAL | Status: DC | PRN

## 2013-06-08 MED ORDER — OXYCODONE-ACETAMINOPHEN 5-325 MG PO TABS: 1.0000 | ORAL_TABLET | ORAL | Status: DC | PRN

## 2013-06-08 NOTE — Progress Notes (Signed)
Pt given D/C instructions with Rx's, verbal understanding was given. All questions were answered prior to D/C. Pt D/C'd home per MD order. Rema Fendt, RN

## 2013-06-08 NOTE — Progress Notes (Signed)
Subjective: Patient reports "Across my shoulders, just sore"  Objective: Vital signs in last 24 hours: Temp:  [96.8 F (36 C)-98.6 F (37 C)] 98 F (36.7 C) (10/08 0748) Pulse Rate:  [75-107] 75 (10/08 0748) Resp:  [12-24] 18 (10/08 0748) BP: (112-169)/(66-93) 119/78 mmHg (10/08 0748) SpO2:  [93 %-100 %] 93 % (10/08 0748)  Intake/Output from previous day: 10/07 0701 - 10/08 0700 In: 2970 [P.O.:720; I.V.:2000; IV Piggyback:250] Out: 3500 [Urine:3105; Drains:95; Blood:300] Intake/Output this shift: Total I/O In: -  Out: 400 [Urine:400]  Alert, conversant. Right elbow drsg intact, dry. Pt w/o c/o pain right elbow/forearm/hand. Improving (decreasing) numbness in right hand 5th digit. No tingling. No pain. Posterior cervical drsg intact, dry - changed this am. Hemovac with dark blood ~25ml overnight. Good strength, ROM, BUE. Vista Collar in use.  Lab Results: No results found for this basename: WBC, HGB, HCT, PLT,  in the last 72 hours BMET No results found for this basename: NA, K, CL, CO2, GLUCOSE, BUN, CREATININE, CALCIUM,  in the last 72 hours  Studies/Results: Dg Cervical Spine 1 View  06/07/2013   CLINICAL DATA:  Exploration of posterior cervical spine C6-C7 fusion.  EXAM: DG CERVICAL SPINE - 1 VIEW  COMPARISON:  02/14/2013  FINDINGS: Single AP view shows an anterior fusion plate and screws extending from C4 through C7. Pedicle screws are noted, 3 on each side, at the thoracolumbar junction, which appear to reside in the C6, C7 and T1 pedicles.  IMPRESSION: Single portable operative view of the cervical spine as described.   Electronically Signed   By: Amie Portland M.D.   On: 06/07/2013 10:05    Assessment/Plan: Improving   LOS: 1 day  Per Dr. Venetia Maxon, mobilize with PT; change cervical drsg prn; D/C Hemovac.   Georgiann Cocker 06/08/2013, 8:07 AM

## 2013-06-08 NOTE — Evaluation (Signed)
Physical Therapy Evaluation Patient Details Name: Troy Collier MRN: [id number] DOB: Jun 01, 1946 Today's Date: 06/08/2013 Time: 7829-5621 PT Time Calculation (min): 22 min  PT Assessment / Plan / Recommendation History of Present Illness  Pt presents with numbness and tingling as well as weakness RUE. Pt underwent PCDF C6-7 and decompression C7-T1. Pt has h/o previous fusion C4-7.  Clinical Impression  Pt mobilizing well this morning, arms feeling improved, still with some numbness left lateral fingers. Reviewed precautions and postural considerations. No further acute needs, discussed outpt PT if needed after acute phase. PT signing off.    PT Assessment  Patent does not need any further PT services    Follow Up Recommendations  No PT follow up    Does the patient have the potential to tolerate intense rehabilitation      Barriers to Discharge        Equipment Recommendations  None recommended by PT    Recommendations for Other Services     Frequency      Precautions / Restrictions Precautions Precautions: Cervical Precaution Comments: reviewed precautions Required Braces or Orthoses: Cervical Brace Cervical Brace: Hard collar;At all times Restrictions Weight Bearing Restrictions: No   Pertinent Vitals/Pain 7/10 neck pain, premedicated      Mobility  Bed Mobility Bed Mobility: Rolling Right;Right Sidelying to Sit;Sitting - Scoot to [place] of Bed Rolling Right: 6: Modified independent (Device/Increase time) Right Sidelying to Sit: 6: Modified independent (Device/Increase time) Sitting - Scoot to Edge of Bed: 6: Modified independent (Device/Increase time) Details for Bed Mobility Assistance: pt performs bed mobility effecively, keeping precautions Transfers Transfers: Sit to Stand;Stand to Sit Sit to Stand: 6: Modified independent (Device/Increase time) Stand to Sit: 6: Modified independent (Device/Increase time) Details for Transfer Assistance: no difficulties with  transfers Ambulation/Gait Ambulation/Gait Assistance: 6: Modified independent (Device/Increase time) Ambulation Distance (Feet): 300 Feet Assistive device: None Ambulation/Gait Assistance Details: no physical assistance needed, guarded gait Gait Pattern: Within Functional Limits Gait velocity: decreased Stairs: Yes Stairs Assistance: 5: Supervision Stairs Assistance Details (indicate cue type and reason): pt has a pillar that he can hold but practiced without using rails Stair Management Technique: No rails Number of Stairs: 4 Wheelchair Mobility Wheelchair Mobility: No    Exercises     PT Diagnosis:    PT Problem List:   PT Treatment Interventions:       PT Goals(Current goals can be found in the care plan section) Acute Rehab PT Goals Patient Stated Goal: return home and work PT Goal Formulation: No goals set, d/c therapy  Visit Information  Last PT Received On: 06/08/13 Assistance Needed: +1 History of Present Illness: Pt presents with numbness and tingling as well as weakness RUE. Pt underwent PCDF C6-7 and decompression C7-T1. Pt has h/o previous fusion C4-7.       Prior Functioning  Home Living Family/patient expects to be discharged to:: Private residence Living Arrangements: Alone Available Help at Discharge: Neighbor;Available PRN/intermittently Type of Home: House Home Access: Stairs to enter [employer] of Steps: 3 Entrance Stairs-Rails: None Home Layout: One level Home Equipment: None Additional Comments: pt has comfort height toilet in one bathroom and walk-in shower in that same bathroom Prior Function Level of Independence: Independent Comments: pt works in Designer, industrial/product, Public house manager and such. Discussed proper positioning and workplace modifications Communication Communication: Expressive difficulties (mild word finding difficulties) Dominant Hand: Right    Cognition  Cognition Arousal/Alertness: Awake/alert Behavior During  Therapy: WFL for tasks assessed/performed Overall Cognitive Status: Within Functional Limits for  tasks assessed    Extremity/Trunk Assessment Upper Extremity Assessment Upper Extremity Assessment: RUE deficits/detail;LUE deficits/detail RUE Deficits / Details: grip strength much improved this morning LUE Deficits / Details: mild numbness remains, lateral fingers LUE Sensation: decreased light touch Lower Extremity Assessment Lower Extremity Assessment: Overall WFL for tasks assessed Cervical / Trunk Assessment Cervical / Trunk Assessment: Normal   Balance Balance Balance Assessed: Yes Dynamic Standing Balance Dynamic Standing - Balance Support: No upper extremity supported;During functional activity Dynamic Standing - Level of Assistance: 6: Modified independent (Device/Increase time)  End of Session PT - End of Session Equipment Utilized During Treatment: Gait belt;Cervical collar Activity Tolerance: Patient tolerated treatment well Patient left: in bed;with call bell/phone within reach Nurse Communication: Mobility status  GP Functional Assessment Tool Used: clincial judgement Functional Limitation: Mobility: Walking and moving around Mobility: Walking and Moving Around Current Status (Z3086): 0 percent impaired, limited or restricted Mobility: Walking and Moving Around Goal Status (V7846): 0 percent impaired, limited or restricted Mobility: Walking and Moving Around Discharge Status [phone]): 0 percent impaired, limited or restricted  Lyanne Co, PT  Acute Rehab Services  [phone]  Lyanne Co 06/08/2013, 8:47 AM

## 2013-06-08 NOTE — Progress Notes (Signed)
UR COMPLETED  

## 2013-06-08 NOTE — Progress Notes (Signed)
Occupational Therapy Evaluation Patient Details Name: Sarthak Rubenstein MRN: [id number] DOB: December 14, 1945 Today's Date: 06/08/2013 Time: 1000-1026 OT Time Calculation (min): 26 min  OT Assessment / Plan / Recommendation History of present illness Pt presents with numbness and tingling as well as weakness RUE. Pt underwent PCDF C6-7 and decompression C7-T1. Pt has h/o previous fusion C4-7.   Clinical Impression   Educated pt on compensatory techniques for ADL/mobility/home safety modifications to adhere to cervical precautions. Also educated pt on tendon gliding exercises for RUE and no heavy lifting. Pt given written information. Pt Demonstrated understanding. No further OT needed. Ready for D/C when stable.    OT Assessment  Patient does not need any further OT services    Follow Up Recommendations  No OT follow up    Barriers to Discharge      Equipment Recommendations  Other (comment) (discussed rec for use of shower seat)    Recommendations for Other Services    Frequency       Precautions / Restrictions Precautions Precautions: Cervical Precaution Comments: reviewed precautions Required Braces or Orthoses: Cervical Brace Cervical Brace: Hard collar;At all times Restrictions Weight Bearing Restrictions: No   Pertinent Vitals/Pain no apparent distress     ADL  Grooming: Supervision/safety;Set up Where Assessed - Grooming: Unsupported standing Upper Body Bathing: Supervision/safety;Set up Where Assessed - Upper Body Bathing: Unsupported sitting Lower Body Bathing: Supervision/safety;Set up Where Assessed - Lower Body Bathing: Unsupported sit to stand Upper Body Dressing: Supervision/safety;Set up Where Assessed - Upper Body Dressing: Unsupported sitting Lower Body Dressing: Supervision/safety;Set up Where Assessed - Lower Body Dressing: Unsupported sit to stand Toilet Transfer: Modified independent Toilet Transfer Method: Other (comment) (ambulating) Toilet Transfer  Equipment: Comfort height toilet Toileting - Clothing Manipulation and Hygiene: Modified independent Where Assessed - Toileting Clothing Manipulation and Hygiene: Sit to stand from 3-in-1 or toilet Equipment Used: Other (comment);Reacher Transfers/Ambulation Related to ADLs: mod I ADL Comments: Educated pt on cervical precautions and ADL/mobility for ADL. Discussed home modifications necessary for home safety/following cervial precautions. Educated on compensatory techniques nec to adhere to precuaitons. Pt verbalized understanding. given writtent informationl.    OT Diagnosis:    OT Problem List:   OT Treatment Interventions:     OT Goals(Current goals can be found in the care plan section) Acute Rehab OT Goals Patient Stated Goal: return home and work OT Goal Formulation:  (eval only)  Visit Information  Last OT Received On: 06/08/13 Assistance Needed: +1 History of Present Illness: Pt presents with numbness and tingling as well as weakness RUE. Pt underwent PCDF C6-7 and decompression C7-T1. Pt has h/o previous fusion C4-7.       Prior Functioning     Home Living Family/patient expects to be discharged to:: Private residence Living Arrangements: Alone Available Help at Discharge: Neighbor;Available PRN/intermittently Type of Home: House Home Access: Stairs to enter [employer] of Steps: 3 Entrance Stairs-Rails: None Home Layout: One level Home Equipment: None Additional Comments: pt has comfort height toilet in one bathroom and walk-in shower in that same bathroom Prior Function Level of Independence: Independent Comments: pt works in Designer, industrial/product, Public house manager and such. Discussed proper positioning and workplace modifications Communication Communication: Expressive difficulties (mild word finding difficulties) Dominant Hand: Right         Vision/Perception     Cognition  Cognition Arousal/Alertness: Awake/alert Behavior During Therapy: WFL  for tasks assessed/performed Overall Cognitive Status: Within Functional Limits for tasks assessed    Extremity/Trunk Assessment Upper Extremity Assessment Upper Extremity  Assessment: RUE deficits/detail RUE Deficits / Details: overall WFL. Pt reports strength and sesnation is imiproving dramatically LUE Deficits / Details: mild numbness remains, lateral fingers LUE Sensation: decreased light touch Lower Extremity Assessment Lower Extremity Assessment: Overall WFL for tasks assessed Cervical / Trunk Assessment Cervical / Trunk Assessment: Normal     Mobility Bed Mobility Bed Mobility: Rolling Right;Right Sidelying to Sit;Sitting - Scoot to [employer] of Bed Rolling Right: 6: Modified independent (Device/Increase time) Right Sidelying to Sit: 6: Modified independent (Device/Increase time) Sitting - Scoot to Edge of Bed: 6: Modified independent (Device/Increase time) Details for Bed Mobility Assistance: pt performs bed mobility effecively, keeping precautions Transfers Sit to Stand: 6: Modified independent (Device/Increase time) Stand to Sit: 6: Modified independent (Device/Increase time) Details for Transfer Assistance: no difficulties with transfers     Exercise     Balance Balance Balance Assessed: Yes Dynamic Standing Balance Dynamic Standing - Balance Support: No upper extremity supported;During functional activity Dynamic Standing - Level of Assistance: 6: Modified independent (Device/Increase time)   End of Session OT - End of Session Activity Tolerance: Patient tolerated treatment well Patient left: in chair;with call bell/phone within reach Nurse Communication: Mobility status  GO Functional Assessment Tool Used: clinical judgement Functional Limitation: Self care Self Care Current Status (Z6109): At least 1 percent but less than 20 percent impaired, limited or restricted Self Care Goal Status (U0454): 0 percent impaired, limited or restricted Self Care Discharge Status  [phone]): 0 percent impaired, limited or restricted   Lakyla Biswas,HILLARY 06/08/2013, 12:50 PM [facility], OTR/L  [phone] 06/08/2013

## 2013-06-08 NOTE — Discharge Summary (Signed)
Physician Discharge Summary  Patient ID: Daelon Dunivan MRN: [id number] DOB/AGE: 10/27/1945 67 y.o.  Admit date: 06/07/2013 Discharge date: 06/08/2013  Admission Diagnoses: Cervical radiculopathy, Cervical spondylolisthesis C 7 / T 1, Cervical spondylosis, Ulnar neuropathy, neck pain   Discharge Diagnoses: Cervical radiculopathy, Cervical spondylolisthesis C 7 / T 1, Cervical spondylosis, Ulnar neuropathy, neck pain S/p Exploration of posterior Cervical six-seven Fusion with Cervical seven thoracic-one posterior cervical decompression/fusion (N/A) with posterior C 7 / T 1 foraminotomy and resection of synovial cyst with microdissection, lateral mass screws C 6 and C 7 with pedicle screws T 1 (segmental instrumentation) with posterolateral arthrodesis C 6 - T 1 levels ULNAR NERVE DECOMPRESSION/TRANSPOSITION (Right)  Active Problems:   * No active hospital problems. *   Discharged Condition: good  Hospital Course: Gaddiel Cullens was admitted for surgery with Dx cervical spondylolisthesis and radiculopathy, and ulnar neuropathy. Following uncomplicated Posterior fusion C6-T1 with foraminotomy for decompression and resection of synovial cyst C7-T1 & right ulnar nerve decompression, pt recovered in Neuro PACU & transferred to 3500 for nursing care and therapies. He has progressed well.   Consults: None  Significant Diagnostic Studies: radiology: X-Ray: intra-operative  Treatments: surgery: Exploration of posterior Cervical six-seven Fusion with Cervical seven thoracic-one posterior cervical decompression/fusion (N/A) with posterior C 7 / T 1 foraminotomy and resection of synovial cyst with microdissection, lateral mass screws C 6 and C 7 with pedicle screws T 1 (segmental instrumentation) with posterolateral arthrodesis C 6 - T 1 levels ULNAR NERVE DECOMPRESSION/TRANSPOSITION (Right)   Discharge Exam: Blood pressure 119/78, pulse 75, temperature 98 F (36.7 C), temperature source Oral, resp.  rate 18, SpO2 93.00%. Alert, conversant. Right elbow drsg intact, dry. Pt w/o c/o pain right elbow/forearm/hand. Improving (decreasing) numbness in right hand 5th digit. No tingling. No pain. Posterior cervical drsg intact, dry - changed this am. Hemovac with dark blood ~33ml overnight - discontinued. Good strength, ROM, BUE. Vista Collar in use.    Disposition: Discharge to home. Rx's Percocet & Valium for prn use. Pt agrees to call office for 3-4 week f/u appt.   Future Appointments Provider Department Dept Phone   10/27/2013 9:00 AM [facility] [place] [facility] [phone]   11/03/2013 2:15 PM Reather Littler, MD  [facility] [phone]       Medication List         acetaminophen-codeine 300-30 MG per tablet  Commonly known as:  TYLENOL #3  Take 1 tablet by mouth every 4 (four) hours as needed for pain (can take twice a day).     ALPRAZolam 0.5 MG tablet  Commonly known as:  XANAX  Take 0.5 mg by mouth at bedtime as needed for sleep.     aspirin 81 MG tablet  Take 81 mg by mouth daily.     atorvastatin 10 MG tablet  Commonly known as:  LIPITOR  Take 10 mg by mouth daily.     calcium carbonate 600 MG Tabs tablet  Commonly known as:  OS-CAL  Take 600 mg by mouth 2 (two) times daily with a meal.     cyclobenzaprine 10 MG tablet  Commonly known as:  FLEXERIL  Take 10 mg by mouth 3 (three) times daily as needed for muscle spasms.     desmopressin 0.2 MG tablet  Commonly known as:  DDAVP  Take 2 tablets (0.4 mg total) by mouth 3 (three) times daily. 2 tabs three times per day.     diazepam 5 MG tablet  Commonly known  as:  VALIUM  Take 1 tablet (5 mg total) by mouth every 6 (six) hours as needed.     DULoxetine 60 MG capsule  Commonly known as:  CYMBALTA  Take 60 mg by mouth daily.     etodolac 400 MG tablet  Commonly known as:  LODINE  Take 400 mg by mouth 2 (two) times daily.     GLUCOSAMINE MSM COMPLEX PO  Take 1 tablet by  mouth daily.     HYDROcodone-acetaminophen 5-325 MG per tablet  Commonly known as:  NORCO/VICODIN  Take 1 tablet by mouth every 6 (six) hours as needed for pain.     hydrocortisone 5 MG tablet  Commonly known as:  CORTEF  Take 5 mg by mouth daily. 2 tabs in the AM and 1 tab at bedtime     levothyroxine 150 MCG tablet  Commonly known as:  SYNTHROID, LEVOTHROID  Take 150 mcg by mouth daily before breakfast.     lidocaine 5 %  Commonly known as:  LIDODERM  Place 1 patch onto the skin daily. Remove & Discard patch within 12 hours or as directed by MD     losartan 100 MG tablet  Commonly known as:  COZAAR  Take 100 mg by mouth daily.     meclizine 25 MG tablet  Commonly known as:  ANTIVERT  Take 25 mg by mouth 3 (three) times daily as needed for nausea.     multivitamin with minerals Tabs tablet  Take 1 tablet by mouth daily.     oxyCODONE-acetaminophen 5-325 MG per tablet  Commonly known as:  PERCOCET/ROXICET  Take 1-2 tablets by mouth every 4 (four) hours as needed.     psyllium 28 % packet  Commonly known as:  METAMUCIL SMOOTH TEXTURE  Take 1 packet by mouth 2 (two) times daily.     sildenafil 50 MG tablet  Commonly known as:  VIAGRA  Take 50 mg by mouth daily as needed for erectile dysfunction.     Somatropin 15 MG/1.5ML Soln  Commonly known as:  NORDITROPIN  0.2 mg injection daily.     tamsulosin 0.4 MG Caps capsule  Commonly known as:  FLOMAX  Take 0.4 mg by mouth daily after supper.     Testosterone 30 MG/ACT Soln  Commonly known as:  AXIRON  Place 30 mg onto the skin 4 (four) times daily.     thiamine 100 MG tablet  Take 100 mg by mouth daily.     traZODone 100 MG tablet  Commonly known as:  DESYREL  Take 100 mg by mouth at bedtime.     vitamin C 500 MG tablet  Commonly known as:  ASCORBIC ACID  Take 500 mg by mouth 2 (two) times daily.     Vitamin D 2000 UNITS tablet  Take 2,000 Units by mouth daily.         Signed: Georgiann Cocker 06/08/2013,  8:29 AM

## 2013-06-09 ENCOUNTER — Encounter (HOSPITAL_COMMUNITY): Payer: Self-pay | Admitting: Neurosurgery

## 2013-07-07 ENCOUNTER — Other Ambulatory Visit: Payer: Self-pay

## 2013-10-26 ENCOUNTER — Other Ambulatory Visit (INDEPENDENT_AMBULATORY_CARE_PROVIDER_SITE_OTHER): Payer: 59

## 2013-10-26 DIAGNOSIS — E230 Hypopituitarism: Secondary | ICD-10-CM

## 2013-10-26 LAB — T4, FREE: FREE T4: 1.35 ng/dL (ref 0.60–1.60)

## 2013-10-27 ENCOUNTER — Other Ambulatory Visit: Payer: 59

## 2013-10-28 LAB — INSULIN-LIKE GROWTH FACTOR: Somatomedin (IGF-I): 152 ng/mL (ref 35–196)

## 2013-10-30 LAB — TESTOSTERONE, TOTAL, LC/MS: Testosterone, total: 624.7 ng/dL (ref 348.0–1197.0)

## 2013-11-03 ENCOUNTER — Ambulatory Visit (INDEPENDENT_AMBULATORY_CARE_PROVIDER_SITE_OTHER): Payer: 59 | Admitting: Endocrinology

## 2013-11-03 ENCOUNTER — Encounter: Payer: Self-pay | Admitting: Endocrinology

## 2013-11-03 VITALS — BP 158/82 | HR 81 | Temp 97.8°F | Resp 16 | Ht 67.5 in | Wt 195.4 lb

## 2013-11-03 DIAGNOSIS — R7303 Prediabetes: Secondary | ICD-10-CM

## 2013-11-03 DIAGNOSIS — E230 Hypopituitarism: Secondary | ICD-10-CM

## 2013-11-03 DIAGNOSIS — E7800 Pure hypercholesterolemia, unspecified: Secondary | ICD-10-CM

## 2013-11-03 DIAGNOSIS — R7309 Other abnormal glucose: Secondary | ICD-10-CM

## 2013-11-03 DIAGNOSIS — I10 Essential (primary) hypertension: Secondary | ICD-10-CM

## 2013-11-03 MED ORDER — TESTOSTERONE 30 MG/ACT TD SOLN: 30.0000 mg | Freq: Four times a day (QID) | TRANSDERMAL | Status: DC

## 2013-11-03 NOTE — Progress Notes (Signed)
Patient ID: Troy Collier, male   DOB: Sep 29, 1945, 68 y.o.   MRN: [id number]  Chief complaint: Followup of hypopituitarism  History of Present Illness:  He has had successful pituitary surgery in 1/99.  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  1. Hormone deficiency: He has been on growth hormone supplement since 2001. His levels have been variable and on the last visit because of a high level of 214 and the dose was reduced. He is now taking 0.3 mg daily although has been on as low dose as 0.2 mg previously. Overall has fairly normal energy level and no unusual fatigue 2. Hypothyroidism: Has been on relatively stable doses of thyroid supplement since his surgery. His free T4 is relatively higher but he has no symptoms of shakiness or palpitations 3. Hypogonadism: He is currently taking Axiron with normal libido and energy level. Using 3 pumps  daily, the dose was increased previously when his testosterone level was 225. Overall feels fairly good and is compliant with the medication daily. His testosterone supplement has been changed periodically because of insurance preference and his last level was normal 4. Adrenal insufficiency: He has been on stable doses of hydrocortisone and no recent problems with unusual weight change, nausea or lightheadedness. He has been taking only 15 mg hydrocortisone a day 5. Diabetes insipidus: He continues to take 2 tablets 3 times a day, usually every 8 hours. He did have transient hyponatremia at one time and with reducing the dose to 5 tabletsl. However he tends to have increased urinary frequency and thirst if he takes less than 6 tablets a day or if he is late in taking his medications   OTHER problems:  He has had impaired fasting glucose and IFG and this has been managed with diet and exercise alone.  Recently able to exercise although not aerobic  Lab Results  Component Value Date   HGBA1C 6.1 04/28/2013   Lab Results   Component Value Date   LDLCALC 54 04/26/2013   CREATININE 0.64 06/01/2013   6 Dyslipidemia/metabolic syndrome: He is on Lipitor, tends to have relatively low HDL     Wt Readings from Last 3 Encounters:  11/03/13 195 lb 6.4 oz (88.633 kg)  06/01/13 208 lb 1.6 oz (94.394 kg)  04/28/13 202 lb 4.8 oz (91.763 kg)     No visits with results within 1 Week(s) from this visit. Latest known visit with results is:  Appointment on 10/26/2013  Component Date Value Ref Range Status  . Free T4 10/26/2013 1.35  0.60 - 1.60 ng/dL Final  . Somatomedin (IGF-I) 10/26/2013 152  35 - 196 ng/mL Final  . Testosterone, total 10/26/2013 624.7  348.0 - 1197.0 ng/dL Final  . Comment 40/98/1191 Comment   Final   Comment: Adult male reference interval is based on a population of lean males                          up to 67 years old.       Medication List       This list is accurate as of: 11/03/13  2:38 PM.  Always use your most recent med list.               acetaminophen-codeine 300-30 MG per tablet  Commonly known as:  TYLENOL #3  Take 1 tablet by mouth every 4 (four) hours as needed for pain (can take twice a day).  ALPRAZolam 0.5 MG tablet  Commonly known as:  XANAX  Take 0.5 mg by mouth at bedtime as needed for sleep.     aspirin 81 MG tablet  Take 81 mg by mouth daily.     atorvastatin 10 MG tablet  Commonly known as:  LIPITOR  Take 10 mg by mouth daily.     calcium carbonate 600 MG Tabs tablet  Commonly known as:  OS-CAL  Take 600 mg by mouth 2 (two) times daily with a meal.     cyclobenzaprine 10 MG tablet  Commonly known as:  FLEXERIL  Take 10 mg by mouth 3 (three) times daily as needed for muscle spasms.     desmopressin 0.2 MG tablet  Commonly known as:  DDAVP  Take 2 tablets (0.4 mg total) by mouth 3 (three) times daily. 2 tabs three times per day.     diazepam 5 MG tablet  Commonly known as:  VALIUM  Take 1 tablet (5 mg total) by mouth every 6 (six) hours as  needed.     DULoxetine 60 MG capsule  Commonly known as:  CYMBALTA  Take 60 mg by mouth daily.     etodolac 400 MG tablet  Commonly known as:  LODINE  Take 400 mg by mouth 2 (two) times daily.     GLUCOSAMINE MSM COMPLEX PO  Take 1 tablet by mouth daily.     HYDROcodone-acetaminophen 5-325 MG per tablet  Commonly known as:  NORCO/VICODIN  Take 1 tablet by mouth every 6 (six) hours as needed for pain.     hydrocortisone 5 MG tablet  Commonly known as:  CORTEF  Take 5 mg by mouth daily. 2 tabs in the AM and 1 tab at bedtime     levothyroxine 150 MCG tablet  Commonly known as:  SYNTHROID, LEVOTHROID  Take 150 mcg by mouth daily before breakfast.     lidocaine 5 %  Commonly known as:  LIDODERM  Place 1 patch onto the skin daily. Remove & Discard patch within 12 hours or as directed by MD     losartan 100 MG tablet  Commonly known as:  COZAAR  Take 100 mg by mouth daily.     meclizine 25 MG tablet  Commonly known as:  ANTIVERT  Take 25 mg by mouth 3 (three) times daily as needed for nausea.     multivitamin with minerals Tabs tablet  Take 1 tablet by mouth daily.     oxyCODONE-acetaminophen 5-325 MG per tablet  Commonly known as:  PERCOCET/ROXICET  Take 1-2 tablets by mouth every 4 (four) hours as needed.     PROAIR HFA 108 (90 BASE) MCG/ACT inhaler  Generic drug:  albuterol     psyllium 28 % packet  Commonly known as:  METAMUCIL SMOOTH TEXTURE  Take 1 packet by mouth 2 (two) times daily.     sildenafil 50 MG tablet  Commonly known as:  VIAGRA  Take 50 mg by mouth daily as needed for erectile dysfunction.     Somatropin 15 MG/1.5ML Soln  Commonly known as:  NORDITROPIN  0.2 mg injection daily.     tamsulosin 0.4 MG Caps capsule  Commonly known as:  FLOMAX  Take 0.4 mg by mouth daily after supper.     Testosterone 30 MG/ACT Soln  Commonly known as:  AXIRON  Place 30 mg onto the skin 4 (four) times daily.     thiamine 100 MG tablet  Take 100 mg by mouth  daily.  traZODone 100 MG tablet  Commonly known as:  DESYREL  Take 100 mg by mouth at bedtime.     vitamin C 500 MG tablet  Commonly known as:  ASCORBIC ACID  Take 500 mg by mouth 2 (two) times daily.     Vitamin D 2000 UNITS tablet  Take 2,000 Units by mouth daily.        Allergies:  Allergies  Allergen Reactions  . Accupril [Quinapril Hcl]   . Niacin And Related     Past Medical History  Diagnosis Date  . Hyperlipidemia   . Hypertension   . Diabetes mellitus without complication   . Diabetes insipidus   . Anxiety   . Sleep apnea     mild no cpap recommended  . Arthritis   . Fibromyalgia   . History of kidney stones   . Frequency of urination   . TMJ (temporomandibular joint disorder)   . Complication of anesthesia     hypotension,tmj,DIFFICULT TILT HEAD, lacking pituitary gland, pt. concerned about temperature regulation     Past Surgical History  Procedure Laterality Date  . Spine surgery    . Rotator cuff repair    . Brain surgery      2000   mc, removed pituitary gland  . Posterior cervical fusion/foraminotomy N/A 06/07/2013    Procedure: Exploration of posterior Cervical six-seven Fusion with Cervical seven thoracic-one posterior cervical decompression/fusion;  Surgeon: Maeola HarmanJoseph Stern, MD;  Location: [facility];  Service: Neurosurgery;  Laterality: N/A;  . Ulnar nerve transposition Right 06/07/2013    Procedure: ULNAR NERVE DECOMPRESSION/TRANSPOSITION;  Surgeon: Maeola HarmanJoseph Stern, MD;  Location: [facility];  Service: Neurosurgery;  Laterality: Right;    Family History  Problem Relation Age of Onset  . Hypertension Other   . Obesity Other   . Heart attack Other     Social History:  reports that he has never smoked. He has never used smokeless tobacco. He reports that he drinks alcohol. He reports that he does not use illicit drugs.  Review of Systems -   HYPERTENSION: He has been on treatment for several years, BP at home  Recently120-135//75 but he  thinks it is higher today because of staying up late at night and stress at work  History of depression   EXAM:  BP 158/82  Pulse 81  Temp(Src) 97.8 F (36.6 C)  Resp 16  Ht 5' 7.5" (1.715 m)  Wt 195 lb 6.4 oz (88.633 kg)  BMI 30.13 kg/m2  SpO2 96%  No pedal edema Biceps reflexes appear normal No tremor.  Assessment/Plan:   PANHYPOPITUITARISM:  Overall well controlled with following problems 1. Secondary hypothyroidism: Clinically doing well, free T4 appears to be relatively higher and will reduce his  dose to 137 mcg on the next Rx 2. Cortisol deficiency: He is still doing well with only small doses of supplement and no clinical signs or symptoms of adrenal insufficiency. To check electrolytes periodically 3. Growth hormone deficiency, long-term and requiring low-dose supplementation with good results previously with improved energy level, not requiring relatively low dose of 0.3 mg and IGF-1 level is normal  4. Hypogonadism: Subjectively has been doing well with supplementation with normal energy level and libido. Testosterone level excellent on 3 times a day and he will continue  5. Diabetes insipidus, long-standing he is quite compliant with his supplementation and appears to be asymptomatic if taking less than 6 tablets a day, sodium normal. No nocturia  Impaired Fasting glucose: To check glucose  and A1c on the next visit. Has been able to achieve weight loss with diet and is starting exercise  Hyperlipidemia/metabolic syndrome:  Is on Lipitor for cardiovascular prophylaxis  Hypertension: Followed by PCP, today has a high reading which he thinks is stress related  Petra Dumler 11/03/2013, 2:38 PM

## 2013-11-04 DIAGNOSIS — I10 Essential (primary) hypertension: Secondary | ICD-10-CM | POA: Insufficient documentation

## 2013-12-26 DIAGNOSIS — Z0279 Encounter for issue of other medical certificate: Secondary | ICD-10-CM

## 2014-04-16 ENCOUNTER — Other Ambulatory Visit: Payer: Self-pay | Admitting: Endocrinology

## 2014-05-04 ENCOUNTER — Other Ambulatory Visit (INDEPENDENT_AMBULATORY_CARE_PROVIDER_SITE_OTHER): Payer: 59

## 2014-05-04 DIAGNOSIS — E230 Hypopituitarism: Secondary | ICD-10-CM

## 2014-05-04 DIAGNOSIS — R7303 Prediabetes: Secondary | ICD-10-CM

## 2014-05-04 DIAGNOSIS — R7309 Other abnormal glucose: Secondary | ICD-10-CM | POA: Diagnosis not present

## 2014-05-04 LAB — COMPREHENSIVE METABOLIC PANEL
ALT: 22 U/L (ref 0–53)
AST: 21 U/L (ref 0–37)
Albumin: 4 g/dL (ref 3.5–5.2)
Alkaline Phosphatase: 67 U/L (ref 39–117)
BILIRUBIN TOTAL: 0.9 mg/dL (ref 0.2–1.2)
BUN: 20 mg/dL (ref 6–23)
CALCIUM: 8.8 mg/dL (ref 8.4–10.5)
CHLORIDE: 99 meq/L (ref 96–112)
CO2: 31 mEq/L (ref 19–32)
CREATININE: 0.9 mg/dL (ref 0.4–1.5)
GFR: 90.65 mL/min (ref >60.00)
Glucose, Bld: 89 mg/dL (ref 70–99)
Potassium: 4.4 mEq/L (ref 3.5–5.1)
Sodium: 135 mEq/L (ref 135–145)
Total Protein: 6.8 g/dL (ref 6.0–8.3)

## 2014-05-04 LAB — URINALYSIS, ROUTINE W REFLEX MICROSCOPIC
Hgb urine dipstick: NEGATIVE
Ketones, ur: NEGATIVE
Leukocytes, UA: NEGATIVE
Nitrite: NEGATIVE
RBC / HPF: NONE SEEN (ref 0–?)
TOTAL PROTEIN, URINE-UPE24: 100 — AB
Urine Glucose: NEGATIVE
Urobilinogen, UA: 0.2 (ref 0.0–1.0)
pH: 6 (ref 5.0–8.0)

## 2014-05-04 LAB — TESTOSTERONE: TESTOSTERONE: 296.13 ng/dL — AB (ref 300.00–890.00)

## 2014-05-04 LAB — HEMOGLOBIN A1C: HEMOGLOBIN A1C: 6.1 % (ref 4.6–6.5)

## 2014-05-04 LAB — T4, FREE: Free T4: 0.49 ng/dL — ABNORMAL LOW (ref 0.60–1.60)

## 2014-05-10 ENCOUNTER — Ambulatory Visit: Payer: 59 | Admitting: Endocrinology

## 2014-05-10 ENCOUNTER — Encounter: Payer: Self-pay | Admitting: *Deleted

## 2014-05-12 ENCOUNTER — Ambulatory Visit (INDEPENDENT_AMBULATORY_CARE_PROVIDER_SITE_OTHER): Payer: 59 | Admitting: Endocrinology

## 2014-05-12 ENCOUNTER — Other Ambulatory Visit: Payer: Self-pay | Admitting: *Deleted

## 2014-05-12 ENCOUNTER — Encounter: Payer: Self-pay | Admitting: Endocrinology

## 2014-05-12 VITALS — BP 130/70 | HR 76 | Temp 97.8°F | Resp 16 | Ht 67.5 in | Wt 197.4 lb

## 2014-05-12 DIAGNOSIS — R7303 Prediabetes: Secondary | ICD-10-CM

## 2014-05-12 DIAGNOSIS — E7800 Pure hypercholesterolemia, unspecified: Secondary | ICD-10-CM

## 2014-05-12 DIAGNOSIS — E230 Hypopituitarism: Secondary | ICD-10-CM

## 2014-05-12 DIAGNOSIS — R7309 Other abnormal glucose: Secondary | ICD-10-CM | POA: Diagnosis not present

## 2014-05-12 MED ORDER — LEVOTHYROXINE SODIUM 150 MCG PO TABS: 150.0000 ug | ORAL_TABLET | Freq: Every day | ORAL | Status: DC

## 2014-05-12 MED ORDER — HYDROCORTISONE 5 MG PO TABS: ORAL_TABLET | ORAL | Status: DC

## 2014-05-12 NOTE — Progress Notes (Signed)
Patient ID: Troy Collier, male   DOB: 06/14/1946, 68 y.o.   MRN: [id number]  Chief complaint: Followup of hypopituitarism  History of Present Illness:  He has had successful pituitary surgery in 1/99.  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 His levels have been variable but is requiring relatively low doses. He is now taking 0.3 mg daily although has been on as low dose as 0.2 mg previously. Overall has fairly normal energy level and no unusual fatigue. Last IGF 1 level in 2/15 was 152 2. Hypothyroidism: Has been on relatively stable doses of thyroid supplement since his surgery. His free T4 on his last visit was relatively higher. He was told to call for his reduced dose on his next refill but he did not do so. Also about 2 weeks ago he was not able to refill his medication and has not taken any. He thinks he may be a little tired but also having difficulty sleeping because of hip pain  Lab Results  Component Value Date   FREET4 0.49* 05/04/2014   FREET4 1.35 10/26/2013   FREET4 1.10 04/26/2013   TSH 0.10* 04/26/2013    3. Hypogonadism: He is currently taking Axiron with normal libido and energy level. Using 3 pumps daily. He said that because of the underarm getting sticky he is using a skin on the side of his chest wall to apply the solution. Also may not be letting it dry completely since he is in a hurry in the morning to get dressed. Although his level was over 600 on his last visit it is now relatively low again  Lab Results  Component Value Date   TESTOSTERONE 296.13* 05/04/2014    4.  Adrenal insufficiency: He has been on stable doses of hydrocortisone. He has been taking only 15 mg hydrocortisone a day with no symptoms of adrenal insufficiency. However he ran out of his medication 2 weeks ago and is now complaining of some nausea but no change in appetite or weight.   5. Diabetes insipidus: He continues to take 2 tablets 3 times a day, usually every 8 hours. He did have transient hyponatremia at one time.    He says he  tends to have increased urinary frequency and thirst if he takes less than 6 tablets a day or if he is late in taking his medications. Has rather concentrated urine on his lab after taking his morning dose and sodium is low normal.   OTHER problems:  He has had impaired fasting glucose and IFG and this has been managed with diet and exercise alone.  Recently having hip pain and is not exercising Generally trying to watch his diet and not gaining weight  Wt Readings from Last 3 Encounters:  05/12/14 197 lb 6.4 oz (89.54 kg)  11/03/13 195 lb 6.4 oz (88.633 kg)  06/01/13 208 lb 1.6 oz (94.394 kg)    Lab Results  Component Value Date   HGBA1C 6.1 05/04/2014   HGBA1C 6.1 04/28/2013   Lab Results  Component Value Date   LDLCALC 54 04/26/2013   CREATININE 0.9 05/04/2014    Dyslipidemia/metabolic syndrome: He is on Lipitor, tends to have relatively low HDL   Lab Results  Component Value Date   CHOL 110 04/26/2013   HDL 34.90* 04/26/2013   LDLCALC 54 04/26/2013   TRIG 108.0 04/26/2013   CHOLHDL 3 04/26/2013     LABS:  No visits with results within 1 Week(s) from this visit. Latest known visit with results is:  Appointment on 05/04/2014  Component Date Value Ref Range Status  . Hemoglobin A1C 05/04/2014 6.1  4.6 - 6.5 % Final   Glycemic Control Guidelines for People with Diabetes:Non Diabetic:  <6%Goal of Therapy: <7%Additional Action Suggested:  >8%   . Sodium 05/04/2014 135  135 - 145 mEq/L Final  . Potassium 05/04/2014 4.4  3.5 - 5.1 mEq/L Final  . Chloride 05/04/2014 99  96 - 112 mEq/L Final  . CO2 05/04/2014 31  19 - 32 mEq/L Final  . Glucose, Bld 05/04/2014 89  70 - 99 mg/dL Final  . BUN 25/95/6387 20  6 - 23 mg/dL Final  . Creatinine, Ser 05/04/2014 0.9  0.4 - 1.5 mg/dL Final  . Total Bilirubin 05/04/2014 0.9  0.2 - 1.2  mg/dL Final  . Alkaline Phosphatase 05/04/2014 67  39 - 117 U/L Final  . AST 05/04/2014 21  0 - 37 U/L Final  . ALT 05/04/2014 22  0 - 53 U/L Final  . Total Protein 05/04/2014 6.8  6.0 - 8.3 g/dL Final  . Albumin 56/43/3295 4.0  3.5 - 5.2 g/dL Final  . Calcium 18/84/1660 8.8  8.4 - 10.5 mg/dL Final  . GFR 63/09/6008 90.65  >60.00 mL/min Final  . Free T4 05/04/2014 0.49* 0.60 - 1.60 ng/dL Final  . Color, Urine 93/23/5573 YELLOW  Yellow;Lt. Yellow Final  . APPearance 05/04/2014 CLEAR  Clear Final  . Specific Gravity, Urine 05/04/2014 >=1.030* 1.000 - 1.030 Final  . pH 05/04/2014 6.0  5.0 - 8.0 Final  . Total Protein, Urine 05/04/2014 100* Negative Final  . Urine Glucose 05/04/2014 NEGATIVE  Negative Final  . Ketones, ur 05/04/2014 NEGATIVE  Negative Final  . Bilirubin Urine 05/04/2014 LARGE* Negative Final  . Hgb urine dipstick 05/04/2014 NEGATIVE  Negative Final  . Urobilinogen, UA 05/04/2014 0.2  0.0 - 1.0 Final  . Leukocytes, UA 05/04/2014 NEGATIVE  Negative Final  . Nitrite 05/04/2014 NEGATIVE  Negative Final  . WBC, UA 05/04/2014 0-2/hpf  0-2/hpf Final  . RBC / HPF 05/04/2014 none seen  0-2/hpf Final  . Squamous Epithelial / LPF 05/04/2014 Rare(0-4/hpf)  Rare(0-4/hpf) Final  . Ca Oxalate Crys, UA 05/04/2014 Presence of* None Final  . Testosterone 05/04/2014 296.13* 300.00 - 890.00 ng/dL Final       Medication List       This list is accurate as of: 05/12/14 10:48 AM.  Always use your most recent med list.               acetaminophen-codeine 300-30 MG per tablet  Commonly known as:  TYLENOL #3  Take 1 tablet by mouth every 4 (four) hours as needed for pain (can take twice a day).     ALPRAZolam 0.5 MG tablet  Commonly known as:  XANAX  Take 0.5 mg by mouth at bedtime as needed for sleep.     aspirin 81 MG tablet  Take 81 mg by mouth daily.     atorvastatin 10 MG tablet  Commonly known as:  LIPITOR  Take 10 mg by mouth daily.     calcium carbonate 600 MG Tabs  tablet  Commonly known as:  OS-CAL  Take 600 mg by mouth 2 (two) times daily with a meal.     cyclobenzaprine 10 MG tablet  Commonly known as:  FLEXERIL  Take 10 mg by mouth 3 (three) times daily as needed for muscle spasms.  desmopressin 0.2 MG tablet  Commonly known as:  DDAVP  TAKE 2 TABLETS (0.4MG ) 3   TIMES DAILY     diazepam 5 MG tablet  Commonly known as:  VALIUM  Take 1 tablet (5 mg total) by mouth every 6 (six) hours as needed.     DULoxetine 60 MG capsule  Commonly known as:  CYMBALTA  Take 60 mg by mouth daily.     etodolac 400 MG tablet  Commonly known as:  LODINE  Take 400 mg by mouth 2 (two) times daily.     GLUCOSAMINE MSM COMPLEX PO  Take 1 tablet by mouth daily.     HYDROcodone-acetaminophen 5-325 MG per tablet  Commonly known as:  NORCO/VICODIN  Take 1 tablet by mouth every 6 (six) hours as needed for pain.     hydrocortisone 5 MG tablet  Commonly known as:  CORTEF  Take 5 mg by mouth daily. 2 tabs in the AM and 1 tab at bedtime     levothyroxine 150 MCG tablet  Commonly known as:  SYNTHROID, LEVOTHROID  Take 150 mcg by mouth daily before breakfast.     lidocaine 5 %  Commonly known as:  LIDODERM  Place 1 patch onto the skin daily. Remove & Discard patch within 12 hours or as directed by MD     losartan 100 MG tablet  Commonly known as:  COZAAR  Take 100 mg by mouth daily.     multivitamin with minerals Tabs tablet  Take 1 tablet by mouth daily.     PROAIR HFA 108 (90 BASE) MCG/ACT inhaler  Generic drug:  albuterol     psyllium 28 % packet  Commonly known as:  METAMUCIL SMOOTH TEXTURE  Take 1 packet by mouth 2 (two) times daily.     sildenafil 50 MG tablet  Commonly known as:  VIAGRA  Take 50 mg by mouth daily as needed for erectile dysfunction.     Somatropin 15 MG/1.5ML Soln  Commonly known as:  NORDITROPIN  0.2 mg injection daily.     tamsulosin 0.4 MG Caps capsule  Commonly known as:  FLOMAX  Take 0.4 mg by mouth daily after  supper.     Testosterone 30 MG/ACT Soln  Commonly known as:  AXIRON  Place 30 mg onto the skin 4 (four) times daily.     traZODone 100 MG tablet  Commonly known as:  DESYREL  Take 100 mg by mouth at bedtime.     vitamin C 500 MG tablet  Commonly known as:  ASCORBIC ACID  Take 500 mg by mouth 2 (two) times daily.     Vitamin D 2000 UNITS tablet  Take 2,000 Units by mouth daily.        Allergies:  Allergies  Allergen Reactions  . Accupril [Quinapril Hcl]   . Niacin And Related     Past Medical History  Diagnosis Date  . Hyperlipidemia   . Hypertension   . Diabetes mellitus without complication   . Diabetes insipidus   . Anxiety   . Sleep apnea     mild no cpap recommended  . Arthritis   . Fibromyalgia   . History of kidney stones   . Frequency of urination   . TMJ (temporomandibular joint disorder)   . Complication of anesthesia     hypotension,tmj,DIFFICULT TILT HEAD, lacking pituitary gland, pt. concerned about temperature regulation     Past Surgical History  Procedure Laterality Date  . Spine surgery    .  Rotator cuff repair    . Brain surgery      2000   mc, removed pituitary gland  . Posterior cervical fusion/foraminotomy N/A 06/07/2013    Procedure: Exploration of posterior Cervical six-seven Fusion with Cervical seven thoracic-one posterior cervical decompression/fusion;  Surgeon: Maeola Harman, MD;  Location: [facility];  Service: Neurosurgery;  Laterality: N/A;  . Ulnar nerve transposition Right 06/07/2013    Procedure: ULNAR NERVE DECOMPRESSION/TRANSPOSITION;  Surgeon: Maeola Harman, MD;  Location: [facility];  Service: Neurosurgery;  Laterality: Right;    Family History  Problem Relation Age of Onset  . Hypertension Other   . Obesity Other   . Heart attack Other     Social History:  reports that he has never smoked. He has never used smokeless tobacco. He reports that he drinks alcohol. He reports that he does not use illicit drugs.  Review  of Systems -   HYPERTENSION: He has been on treatment for several years, BP at home  checked periodically. Followed by PCP  History of depression  having hip pain and seeing orthopedic surgeon this   EXAM:  BP 124/68  Pulse 76  Temp(Src) 97.8 F (36.6 C)  Resp 16  Ht 5' 7.5" (1.715 m)  Wt 197 lb 6.4 oz (89.54 kg)  BMI 30.44 kg/m2  SpO2 96%  Standing blood pressure 130/70   No pallor No edema   Assessment/Plan:   PANHYPOPITUITARISM:   He is now having the  following problems 1. Secondary hypothyroidism: He has been out of the medication due to some difficulty with getting it refilled from his mail-order pharmacy. The free T4 is significantly below normal and for now will continue on the same dose of 150 mcg which she was taking and doing fairly well on. He will get a refill from the local pharmacy 2. Cortisol deficiency: He  has generally been doing well with only small doses of supplement. However she did not understand the need for taking the medication without interruption and  is out of his medication. Currentlyno clinical signs or symptoms of adrenal insufficiency except for nausea . To check electrolytes periodically 3. Growth hormone deficiency, long-term and requiring low-dose supplementation with good results previously with improved energy level, not requiring relatively low dose of 0.3 mg and IGF-1 level needs to be checked in followup  4. Hypogonadism: Subjectively has been doing well with supplementation with normal energy level and libido. Testosterone level  relatively lower and may be related to his concurrent application technique and location of the Axiron. Discussed how to apply this and he will change his regimen. He prefers to do this in the morning  5. Diabetes insipidus, long-standing he is quite compliant with his supplementation. His sodium is low normal and since his urine is relatively concentrated he can try to at least reduce morning tablet in half.No  nocturia  Other problems: Impaired Fasting glucose:  had normal glucose in upper normal   Has been able to  maintain  weight loss with diet and  hopefully will restart exercise when he is better with his hip pain   Hyperlipidemia/metabolic syndrome:  Is on Lipitor    Hypertension: Followed by PCP nd is well-controlled    Terin Dierolf 05/12/2014, 10:48 AM

## 2014-05-12 NOTE — Patient Instructions (Addendum)
DDAVP 1 1/2 pill in am, 2 pills at other times  Axiron to be applied in underarm area and let it dry  Synthroid same dose

## 2014-08-09 ENCOUNTER — Other Ambulatory Visit: Payer: 59

## 2014-08-09 DIAGNOSIS — M5416 Radiculopathy, lumbar region: Secondary | ICD-10-CM | POA: Diagnosis not present

## 2014-08-09 DIAGNOSIS — M4316 Spondylolisthesis, lumbar region: Secondary | ICD-10-CM | POA: Diagnosis not present

## 2014-08-09 DIAGNOSIS — Z6830 Body mass index (BMI) 30.0-30.9, adult: Secondary | ICD-10-CM | POA: Diagnosis not present

## 2014-08-09 DIAGNOSIS — M5126 Other intervertebral disc displacement, lumbar region: Secondary | ICD-10-CM | POA: Diagnosis not present

## 2014-08-09 DIAGNOSIS — M4806 Spinal stenosis, lumbar region: Secondary | ICD-10-CM | POA: Diagnosis not present

## 2014-08-09 DIAGNOSIS — I10 Essential (primary) hypertension: Secondary | ICD-10-CM | POA: Diagnosis not present

## 2014-08-10 DIAGNOSIS — M5416 Radiculopathy, lumbar region: Secondary | ICD-10-CM | POA: Diagnosis not present

## 2014-08-10 DIAGNOSIS — M5126 Other intervertebral disc displacement, lumbar region: Secondary | ICD-10-CM | POA: Diagnosis not present

## 2014-08-11 ENCOUNTER — Ambulatory Visit: Payer: 59 | Admitting: Endocrinology

## 2014-08-18 DIAGNOSIS — M797 Fibromyalgia: Secondary | ICD-10-CM | POA: Diagnosis not present

## 2014-08-18 DIAGNOSIS — M5030 Other cervical disc degeneration, unspecified cervical region: Secondary | ICD-10-CM | POA: Diagnosis not present

## 2014-08-18 DIAGNOSIS — M19041 Primary osteoarthritis, right hand: Secondary | ICD-10-CM | POA: Diagnosis not present

## 2014-08-18 DIAGNOSIS — R5383 Other fatigue: Secondary | ICD-10-CM | POA: Diagnosis not present

## 2014-08-21 ENCOUNTER — Other Ambulatory Visit: Payer: 59

## 2014-08-21 ENCOUNTER — Other Ambulatory Visit (INDEPENDENT_AMBULATORY_CARE_PROVIDER_SITE_OTHER): Payer: 59

## 2014-08-21 DIAGNOSIS — E230 Hypopituitarism: Secondary | ICD-10-CM

## 2014-08-21 LAB — URINALYSIS, ROUTINE W REFLEX MICROSCOPIC
Hgb urine dipstick: NEGATIVE
KETONES UR: NEGATIVE
LEUKOCYTES UA: NEGATIVE
NITRITE: NEGATIVE
SPECIFIC GRAVITY, URINE: 1.01 (ref 1.000–1.030)
Total Protein, Urine: NEGATIVE
UROBILINOGEN UA: 0.2 (ref 0.0–1.0)
Urine Glucose: NEGATIVE
pH: 7 (ref 5.0–8.0)

## 2014-08-21 LAB — BASIC METABOLIC PANEL
BUN: 13 mg/dL (ref 6–23)
CALCIUM: 8.8 mg/dL (ref 8.4–10.5)
CO2: 31 meq/L (ref 19–32)
CREATININE: 0.6 mg/dL (ref 0.4–1.5)
Chloride: 97 mEq/L (ref 96–112)
GFR: 137.45 mL/min (ref >60.00)
GLUCOSE: 99 mg/dL (ref 70–99)
Potassium: 3.7 mEq/L (ref 3.5–5.1)
Sodium: 135 mEq/L (ref 135–145)

## 2014-08-22 LAB — T4, FREE: FREE T4: 1.02 ng/dL (ref 0.60–1.60)

## 2014-08-22 LAB — TESTOSTERONE: Testosterone: 498.21 ng/dL (ref 300.00–890.00)

## 2014-08-26 LAB — INSULIN-LIKE GROWTH FACTOR
IGF-I, LC/MS: 168 ng/mL (ref 41–279)
Z-Score (Male): 0.8 SD (ref ?–2.0)

## 2014-08-30 ENCOUNTER — Other Ambulatory Visit: Payer: Self-pay | Admitting: *Deleted

## 2014-08-30 ENCOUNTER — Encounter: Payer: Self-pay | Admitting: Endocrinology

## 2014-08-30 ENCOUNTER — Ambulatory Visit (INDEPENDENT_AMBULATORY_CARE_PROVIDER_SITE_OTHER): Payer: 59 | Admitting: Endocrinology

## 2014-08-30 VITALS — BP 140/82 | HR 92 | Temp 98.0°F | Resp 14 | Ht 67.0 in | Wt 203.6 lb

## 2014-08-30 DIAGNOSIS — R7309 Other abnormal glucose: Secondary | ICD-10-CM | POA: Diagnosis not present

## 2014-08-30 DIAGNOSIS — R7303 Prediabetes: Secondary | ICD-10-CM

## 2014-08-30 DIAGNOSIS — E230 Hypopituitarism: Secondary | ICD-10-CM

## 2014-08-30 MED ORDER — TESTOSTERONE 30 MG/ACT TD SOLN: 30.0000 mg | Freq: Four times a day (QID) | TRANSDERMAL | Status: DC

## 2014-08-30 MED ORDER — SOMATROPIN 15 MG/1.5ML ~~LOC~~ SOLN: SUBCUTANEOUS | Status: DC

## 2014-08-30 NOTE — Patient Instructions (Signed)
Stress doses of hydrocortisone of 2-3x normal if sick

## 2014-08-30 NOTE — Progress Notes (Signed)
Patient ID: Troy Collier, male   DOB: 05/20/1946, 68 y.o.   MRN: [id number]   Chief complaint: Followup of hypopituitarism  History of Present Illness:  He has had successful pituitary surgery in 09/1997.  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 His levels have been variable but is now requiring relatively low doses. He is  taking 0.3 mg daily although has been on as low dose as 0.2 mg previously.  Is compliant with his injections daily.  Recently has fairly normal energy level and no unusual fatigue. Last IGF 1 level in 12/15 is 168 2. Hypothyroidism: Has been on relatively stable doses of thyroid supplement since his surgery. His free T4 on his last visit was significantly low from his running out of his medication.  Also was having fatigue but this is better now recently.  Free T4 is quite normal, was slightly higher from a different lab in 2/15   Lab Results  Component Value Date   FREET4 1.02 08/21/2014   FREET4 0.49* 05/04/2014   FREET4 1.35 10/26/2013   TSH 0.10* 04/26/2013    3. Hypogonadism: He is currently taking Axiron with normal libido and energy level. Using 3 pumps daily. He is now taking the Axiron as directed in his axilla and applying his deodorant afterwards, previously was not using the right technique or surface.  His level is much better than the last 2   Lab Results  Component Value Date   TESTOSTERONE 498.21 08/21/2014    4.  Adrenal insufficiency: He has been on stable doses of hydrocortisone. He has been taking only 15 mg hydrocortisone a day with no symptoms of adrenal insufficiency.  No recent nausea, had this symptom when he ran out of his medication on his last visit.  He does not know how to increase his dose for stress 5. Diabetes insipidus: He continues to take 2 tablets 3 times a day, usually every 8 hours. He did have transient  hyponatremia at one time.    He says he  tends to have increased urinary frequency and thirst if he takes less than 6 tablets a day or if he is late in taking his medications. Has rather concentrated urine on his lab after taking his morning dose and sodium is stable but still low normal.   OTHER problems:  He has had impaired fasting glucose and IFG and this has been managed with diet and exercise alone. Has difficulty exercising because of low back pain recently and had epidural steroid  Generally trying to watch his diet and not gaining weight  Wt Readings from Last 3 Encounters:  08/30/14 203 lb 9.6 oz (92.352 kg)  05/12/14 197 lb 6.4 oz (89.54 kg)  11/03/13 195 lb 6.4 oz (88.633 kg)    Lab Results  Component Value Date   HGBA1C 6.1 05/04/2014   HGBA1C 6.1 04/28/2013   Lab Results  Component Value Date   LDLCALC 54 04/26/2013   CREATININE 0.6 08/21/2014    Dyslipidemia/metabolic syndrome: He is on Lipitor, tends to have relatively low HDL   Lab Results  Component Value Date   CHOL 110 04/26/2013   HDL 34.90* 04/26/2013   LDLCALC 54 04/26/2013   TRIG 108.0 04/26/2013   CHOLHDL 3 04/26/2013     LABS:    No visits with results within 1 Week(s) from this visit.  Latest known visit with results is:  Lab on 08/21/2014  Component Date Value Ref Range Status  . Sodium 08/21/2014 135  135 - 145 mEq/L Final  . Potassium 08/21/2014 3.7  3.5 - 5.1 mEq/L Final  . Chloride 08/21/2014 97  96 - 112 mEq/L Final  . CO2 08/21/2014 31  19 - 32 mEq/L Final  . Glucose, Bld 08/21/2014 99  70 - 99 mg/dL Final  . BUN 16/06/9603 13  6 - 23 mg/dL Final  . Creatinine, Ser 08/21/2014 0.6  0.4 - 1.5 mg/dL Final  . Calcium 54/05/8118 8.8  8.4 - 10.5 mg/dL Final  . GFR 14/78/2956 137.45  >60.00 mL/min Final  . Color, Urine 08/21/2014 YELLOW  Yellow;Lt. Yellow Final  . APPearance 08/21/2014 CLEAR  Clear Final  . Specific Gravity, Urine 08/21/2014 1.010  1.000-1.030 Final  . pH 08/21/2014  7.0  5.0 - 8.0 Final  . Total Protein, Urine 08/21/2014 NEGATIVE  Negative Final  . Urine Glucose 08/21/2014 NEGATIVE  Negative Final  . Ketones, ur 08/21/2014 NEGATIVE  Negative Final  . Bilirubin Urine 08/21/2014 MODERATE* Negative Final  . Hgb urine dipstick 08/21/2014 NEGATIVE  Negative Final  . Urobilinogen, UA 08/21/2014 0.2  0.0 - 1.0 Final  . Leukocytes, UA 08/21/2014 NEGATIVE  Negative Final  . Nitrite 08/21/2014 NEGATIVE  Negative Final  . WBC, UA 08/21/2014 0-2/hpf  0-2/hpf Final  . Free T4 08/21/2014 1.02  0.60 - 1.60 ng/dL Final  . Testosterone 21/30/8657 498.21  300.00 - 890.00 ng/dL Final  . IGF-I, LC/MS 84/69/6295 168  41 - 279 ng/mL Final  . Z-Score (Male) 08/21/2014 0.8  -2.0-+2.0 SD Final   Comment:     This test was developed and its performance characteristics have been determined by [employer], [facility]. Performance characteristics refer to the analytical performance of the test.          Medication List       This list is accurate as of: 08/30/14  3:29 PM.  Always use your most recent med list.               acetaminophen-codeine 300-30 MG per tablet  Commonly known as:  TYLENOL #3  Take 1 tablet by mouth every 4 (four) hours as needed for pain (can take twice a day).     ALPRAZolam 0.5 MG tablet  Commonly known as:  XANAX  Take 0.5 mg by mouth at bedtime as needed for sleep.     aspirin 81 MG tablet  Take 81 mg by mouth daily.     atorvastatin 10 MG tablet  Commonly known as:  LIPITOR  Take 10 mg by mouth daily.     calcium carbonate 600 MG Tabs tablet  Commonly known as:  OS-CAL  Take 600 mg by mouth 2 (two) times daily with a meal.     cyclobenzaprine 10 MG tablet  Commonly known as:  FLEXERIL  Take 10 mg by mouth 3 (three) times daily as needed for muscle spasms.     desmopressin 0.2 MG tablet  Commonly known as:  DDAVP  TAKE 2 TABLETS (0.4MG ) 3   TIMES DAILY     DULoxetine 60 MG capsule    Commonly known as:  CYMBALTA  Take 60 mg by mouth daily.     etodolac 400 MG tablet  Commonly known as:  LODINE  Take 400 mg by mouth 2 (two) times daily.     GLUCOSAMINE MSM COMPLEX PO  Take  1 tablet by mouth daily.     HYDROcodone-acetaminophen 5-325 MG per tablet  Commonly known as:  NORCO/VICODIN  Take 1 tablet by mouth every 6 (six) hours as needed for pain.     hydrocortisone 5 MG tablet  Commonly known as:  CORTEF  2 tabs in the AM and 1 tab at 5 pm     levothyroxine 150 MCG tablet  Commonly known as:  SYNTHROID, LEVOTHROID  Take 1 tablet (150 mcg total) by mouth daily before breakfast.     lidocaine 5 %  Commonly known as:  LIDODERM  Place 1 patch onto the skin daily. Remove & Discard patch within 12 hours or as directed by MD     losartan 100 MG tablet  Commonly known as:  COZAAR  Take 100 mg by mouth daily.     methocarbamol 500 MG tablet  Commonly known as:  ROBAXIN     multivitamin with minerals Tabs tablet  Take 1 tablet by mouth daily.     psyllium 28 % packet  Commonly known as:  METAMUCIL SMOOTH TEXTURE  Take 1 packet by mouth 2 (two) times daily.     sildenafil 50 MG tablet  Commonly known as:  VIAGRA  Take 50 mg by mouth daily as needed for erectile dysfunction.     Somatropin 15 MG/1.5ML Soln  Commonly known as:  NORDITROPIN  0.2 mg injection daily.     tamsulosin 0.4 MG Caps capsule  Commonly known as:  FLOMAX  Take 0.4 mg by mouth daily after supper.     Testosterone 30 MG/ACT Soln  Commonly known as:  AXIRON  Place 30 mg onto the skin 4 (four) times daily.     traZODone 100 MG tablet  Commonly known as:  DESYREL  Take 100 mg by mouth at bedtime.     vitamin C 500 MG tablet  Commonly known as:  ASCORBIC ACID  Take 500 mg by mouth 2 (two) times daily.     Vitamin D 2000 UNITS tablet  Take 2,000 Units by mouth daily.        Allergies:  Allergies  Allergen Reactions  . Accupril [Quinapril Hcl]   . Niacin And Related      Past Medical History  Diagnosis Date  . Hyperlipidemia   . Hypertension   . Diabetes mellitus without complication   . Diabetes insipidus   . Anxiety   . Sleep apnea     mild no cpap recommended  . Arthritis   . Fibromyalgia   . History of kidney stones   . Frequency of urination   . TMJ (temporomandibular joint disorder)   . Complication of anesthesia     hypotension,tmj,DIFFICULT TILT HEAD, lacking pituitary gland, pt. concerned about temperature regulation     Past Surgical History  Procedure Laterality Date  . Spine surgery    . Rotator cuff repair    . Brain surgery      2000   mc, removed pituitary gland  . Posterior cervical fusion/foraminotomy N/A 06/07/2013    Procedure: Exploration of posterior Cervical six-seven Fusion with Cervical seven thoracic-one posterior cervical decompression/fusion;  Surgeon: Maeola HarmanJoseph Stern, MD;  Location: [facility];  Service: Neurosurgery;  Laterality: N/A;  . Ulnar nerve transposition Right 06/07/2013    Procedure: ULNAR NERVE DECOMPRESSION/TRANSPOSITION;  Surgeon: Maeola HarmanJoseph Stern, MD;  Location: [facility];  Service: Neurosurgery;  Laterality: Right;    Family History  Problem Relation Age of Onset  . Hypertension Other   .  Obesity Other   . Heart attack Other     Social History:  reports that he has never smoked. He has never used smokeless tobacco. He reports that he drinks alcohol. He reports that he does not use illicit drugs.  Review of Systems -   HYPERTENSION: He has been on treatment for several years, BP at home  checked periodically. Followed by PCP  History of depression   EXAM:  BP 140/82 mmHg  Pulse 92  Temp(Src) 98 F (36.7 C)  Resp 14  Ht 5\' 7"  (1.702 m)  Wt 203 lb 9.6 oz (92.352 kg)  BMI 31.88 kg/m2  SpO2 95%   No pallor Biceps reflexes normal No peripheral edema   Assessment/Plan:   PANHYPOPITUITARISM:  1. Secondary hypothyroidism: He has fairly good free T4 level and clinically doing well  with 150 g.  Discussed that normal free T4 range is wide but will rely on clinical assessment to adjust the dose.  2. Cortisol deficiency: He  has generally been doing well with only small doses of supplement.  Sodium is low normal.  Discussed using stress doses for illness. 3. Growth hormone deficiency, long-term and requiring low-dose supplementation with good results  with improved energy level, still requiring relatively low dose of 0.3 mg and IGF-1 level is quite normal 4. Hypogonadism: Subjectively has been doing well with supplementation with normal energy level and libido. Testosterone level better after applying the Axiron with the correct technique   5. Diabetes insipidus, long-standing he is quite compliant with his supplementation. His sodium is low normal which may be for other reasons and since his urine is relatively dilute.  However he is not able to reduce his dose below tablets a day  Hypertension: Followed by PCP    [facility] 08/30/2014, 3:29 PM

## 2014-10-06 ENCOUNTER — Other Ambulatory Visit: Payer: Self-pay | Admitting: Neurosurgery

## 2014-10-18 ENCOUNTER — Inpatient Hospital Stay (HOSPITAL_COMMUNITY): Admission: RE | Admit: 2014-10-18 | Payer: Medicare Other | Source: Ambulatory Visit

## 2014-10-20 ENCOUNTER — Encounter (HOSPITAL_COMMUNITY): Payer: Self-pay

## 2014-10-20 ENCOUNTER — Encounter (HOSPITAL_COMMUNITY)
Admission: RE | Admit: 2014-10-20 | Discharge: 2014-10-20 | Disposition: A | Discharge status: Home / Self Care | Payer: 59 | Source: Ambulatory Visit | Attending: Neurosurgery | Admitting: Neurosurgery

## 2014-10-20 DIAGNOSIS — M4317 Spondylolisthesis, lumbosacral region: Secondary | ICD-10-CM | POA: Diagnosis not present

## 2014-10-20 DIAGNOSIS — Z0183 Encounter for blood typing: Secondary | ICD-10-CM | POA: Diagnosis not present

## 2014-10-20 DIAGNOSIS — Z01818 Encounter for other preprocedural examination: Secondary | ICD-10-CM | POA: Diagnosis not present

## 2014-10-20 DIAGNOSIS — R9431 Abnormal electrocardiogram [ECG] [EKG]: Secondary | ICD-10-CM | POA: Diagnosis not present

## 2014-10-20 DIAGNOSIS — I10 Essential (primary) hypertension: Secondary | ICD-10-CM | POA: Insufficient documentation

## 2014-10-20 DIAGNOSIS — Z7982 Long term (current) use of aspirin: Secondary | ICD-10-CM | POA: Insufficient documentation

## 2014-10-20 DIAGNOSIS — E230 Hypopituitarism: Secondary | ICD-10-CM | POA: Diagnosis not present

## 2014-10-20 DIAGNOSIS — Z981 Arthrodesis status: Secondary | ICD-10-CM | POA: Insufficient documentation

## 2014-10-20 DIAGNOSIS — M797 Fibromyalgia: Secondary | ICD-10-CM | POA: Insufficient documentation

## 2014-10-20 DIAGNOSIS — Z79899 Other long term (current) drug therapy: Secondary | ICD-10-CM | POA: Insufficient documentation

## 2014-10-20 DIAGNOSIS — M4316 Spondylolisthesis, lumbar region: Secondary | ICD-10-CM | POA: Insufficient documentation

## 2014-10-20 DIAGNOSIS — Z01812 Encounter for preprocedural laboratory examination: Secondary | ICD-10-CM | POA: Insufficient documentation

## 2014-10-20 LAB — SURGICAL PCR SCREEN
MRSA, PCR: NEGATIVE
Staphylococcus aureus: NEGATIVE

## 2014-10-20 LAB — BASIC METABOLIC PANEL
ANION GAP: 9 (ref 5–15)
BUN: 15 mg/dL (ref 6–23)
CALCIUM: 9.3 mg/dL (ref 8.4–10.5)
CHLORIDE: 105 mmol/L (ref 96–112)
CO2: 27 mmol/L (ref 19–32)
CREATININE: 1.03 mg/dL (ref 0.50–1.35)
GFR calc non Af Amer: 73 mL/min — ABNORMAL LOW (ref >90)
GFR, EST AFRICAN AMERICAN: 85 mL/min — AB (ref >90)
Glucose, Bld: 95 mg/dL (ref 70–99)
Potassium: 3.9 mmol/L (ref 3.5–5.1)
Sodium: 141 mmol/L (ref 135–145)

## 2014-10-20 LAB — TYPE AND SCREEN
ABO/RH(D): A POS
Antibody Screen: NEGATIVE

## 2014-10-20 LAB — CBC
HEMATOCRIT: 46.5 % (ref 39.0–52.0)
Hemoglobin: 15.3 g/dL (ref 13.0–17.0)
MCH: 30.8 pg (ref 26.0–34.0)
MCHC: 32.9 g/dL (ref 30.0–36.0)
MCV: 93.8 fL (ref 78.0–100.0)
Platelets: 216 10*3/uL (ref 150–400)
RBC: 4.96 MIL/uL (ref 4.22–5.81)
RDW: 12.8 % (ref 11.5–15.5)
WBC: 6.7 10*3/uL (ref 4.0–10.5)

## 2014-10-20 NOTE — Pre-Procedure Instructions (Signed)
Troy DonnaJohn Collier  10/20/2014   Your procedure is scheduled on:  10-26-2014   Thursday   [facility] 5:30 AM .  Call this number if you have problems the morning of surgery: [phone]   Remember:   Do not eat food or drink liquids after midnight.    Take these medicines the morning of surgery with A SIP OF WATER: pain medication as needed,cyclobenzaprine(Flexeril),DDAVP,cymbalta,levothyroxine(synthroid)hydrocortisone,methocarbammol(Robaxin),Somatropin,   Do not wear jewelry, make-up or nail polish.  Do not wear lotions, powders, or perfumes. You may not  wear deodorant.  Do not shave 48 hours prior to surgery. Men may shave face and neck.  Do not bring valuables to the hospital.  [facility] is not responsible for any belongings or valuables.               Contacts, dentures or bridgework may not be worn into surgery.   Leave suitcase in the car. After surgery it may be brought to your room.  For patients admitted to the hospital, discharge time is determined by your treatment team.               Patients discharged the day of surgery will not be allowed to drive home.      Special Instructions: See attached sheet for instructions on CHG shower/bath   Please read over the following fact sheets that you were given: Pain Booklet, Blood Transfusion Information and Surgical Site Infection Prevention

## 2014-10-23 ENCOUNTER — Encounter (HOSPITAL_COMMUNITY): Payer: Self-pay

## 2014-10-23 NOTE — Progress Notes (Signed)
Anesthesia Chart Review:  Patient is a 69 year old male scheduled for L2-3, L3-4, L4-5, L5-S1 PLIF on 10/26/14 by Dr. Venetia MaxonStern.  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  BMI is consistent with mild obesity.  PCP is listed as Dr. Olivia CanterWilliam Kelly with [facility].   Anesthesia concerns: TMJ, limited neck mobility, post-operative hypotension, hypopituitarism effects. (I was not asked to speak with him during his PAT visit.)  Meds include Xanax, ASA 81mg , Lipitor, Flexeril, DDAVP, Cymbalta, Lodine, Cortef, levothyroxine, Cozaar, Robaxin, Viagra, Somatropin, Flomax, Axiron, trazodone.  10/20/14 EKG: NSR, cannot rule out anterior infarct (age undetermined). Confirmed by cardiologist Dr. Anne FuSkains who felt patient had "Similar J point elevation in V2 as was seen in 2003 tracing."  Preoperative labs noted.   Patient's anesthesia records from 06/07/13 is in Epic for review.  Stylet and video-laryngoscopy used to place 7.5 mm ETT. He is scheduled to take his panhypopituitarism medications on the morning of surgery. He received dexamethasone and additional hydrocortisone peri-operatively for that procedure.  Definitive anesthesia plan to be discussed following anesthesiologist evaluation on the day of surgery.  Velna Ochsllison Hiedi Touchton, PA-C [facility] Short Stay Center/Anesthesiology Phone [phone] 10/23/2014 12:40 PM

## 2014-10-25 MED ORDER — CEFAZOLIN SODIUM-DEXTROSE 2-3 GM-% IV SOLR
2.0000 g | INTRAVENOUS | Status: AC
  Administered 2014-10-26 (×2): 2 g via INTRAVENOUS

## 2014-10-26 ENCOUNTER — Inpatient Hospital Stay (HOSPITAL_COMMUNITY): Payer: 59 | Admitting: Anesthesiology

## 2014-10-26 ENCOUNTER — Inpatient Hospital Stay (HOSPITAL_COMMUNITY): Payer: 59

## 2014-10-26 ENCOUNTER — Encounter (HOSPITAL_COMMUNITY)
Admission: RE | Disposition: A | Discharge status: Skilled Nursing Facility | Payer: Medicare Other | Source: Ambulatory Visit | Attending: Neurosurgery

## 2014-10-26 ENCOUNTER — Inpatient Hospital Stay (HOSPITAL_COMMUNITY)
Admission: RE | Admit: 2014-10-26 | Discharge: 2014-10-30 | DRG: 457 | Disposition: A | Discharge status: Skilled Nursing Facility | Payer: 59 | Source: Ambulatory Visit | Attending: Neurosurgery | Admitting: Neurosurgery

## 2014-10-26 ENCOUNTER — Inpatient Hospital Stay (HOSPITAL_COMMUNITY): Payer: 59 | Admitting: Vascular Surgery

## 2014-10-26 DIAGNOSIS — M4806 Spinal stenosis, lumbar region: Secondary | ICD-10-CM | POA: Diagnosis present

## 2014-10-26 DIAGNOSIS — M4126 Other idiopathic scoliosis, lumbar region: Secondary | ICD-10-CM | POA: Diagnosis present

## 2014-10-26 DIAGNOSIS — E785 Hyperlipidemia, unspecified: Secondary | ICD-10-CM | POA: Diagnosis present

## 2014-10-26 DIAGNOSIS — Z981 Arthrodesis status: Secondary | ICD-10-CM

## 2014-10-26 DIAGNOSIS — M419 Scoliosis, unspecified: Secondary | ICD-10-CM | POA: Diagnosis present

## 2014-10-26 DIAGNOSIS — Z419 Encounter for procedure for purposes other than remedying health state, unspecified: Secondary | ICD-10-CM

## 2014-10-26 DIAGNOSIS — M4316 Spondylolisthesis, lumbar region: Secondary | ICD-10-CM | POA: Diagnosis present

## 2014-10-26 DIAGNOSIS — M549 Dorsalgia, unspecified: Secondary | ICD-10-CM | POA: Diagnosis present

## 2014-10-26 DIAGNOSIS — Z7982 Long term (current) use of aspirin: Secondary | ICD-10-CM

## 2014-10-26 DIAGNOSIS — M797 Fibromyalgia: Secondary | ICD-10-CM | POA: Diagnosis present

## 2014-10-26 DIAGNOSIS — E230 Hypopituitarism: Secondary | ICD-10-CM | POA: Diagnosis present

## 2014-10-26 DIAGNOSIS — I952 Hypotension due to drugs: Secondary | ICD-10-CM

## 2014-10-26 DIAGNOSIS — I10 Essential (primary) hypertension: Secondary | ICD-10-CM | POA: Diagnosis present

## 2014-10-26 DIAGNOSIS — I9581 Postprocedural hypotension: Secondary | ICD-10-CM | POA: Diagnosis not present

## 2014-10-26 DIAGNOSIS — E119 Type 2 diabetes mellitus without complications: Secondary | ICD-10-CM | POA: Diagnosis present

## 2014-10-26 DIAGNOSIS — I9788 Other intraoperative complications of the circulatory system, not elsewhere classified: Secondary | ICD-10-CM | POA: Diagnosis not present

## 2014-10-26 DIAGNOSIS — G8918 Other acute postprocedural pain: Secondary | ICD-10-CM

## 2014-10-26 DIAGNOSIS — Z7952 Long term (current) use of systemic steroids: Secondary | ICD-10-CM | POA: Diagnosis not present

## 2014-10-26 HISTORY — PX: POSTERIOR LUMBAR FUSION 4 LEVEL: SHX6037

## 2014-10-26 LAB — GLUCOSE, CAPILLARY
Glucose-Capillary: 126 mg/dL — ABNORMAL HIGH (ref 70–99)
Glucose-Capillary: 145 mg/dL — ABNORMAL HIGH (ref 70–99)

## 2014-10-26 SURGERY — POSTERIOR LUMBAR FUSION 4 LEVEL
Anesthesia: General | Site: Back

## 2014-10-26 MED ORDER — METHOCARBAMOL 500 MG PO TABS: 500.0000 mg | ORAL_TABLET | Freq: Every day | ORAL | Status: DC | PRN

## 2014-10-26 MED ORDER — HYDROMORPHONE HCL 1 MG/ML IJ SOLN
INTRAMUSCULAR | Status: AC
  Filled 2014-10-26: qty 1

## 2014-10-26 MED ORDER — SODIUM CHLORIDE 0.9 % IJ SOLN
INTRAMUSCULAR | Status: AC
  Filled 2014-10-26: qty 10

## 2014-10-26 MED ORDER — NEOSTIGMINE METHYLSULFATE 10 MG/10ML IV SOLN
INTRAVENOUS | Status: AC
  Filled 2014-10-26: qty 1

## 2014-10-26 MED ORDER — ETODOLAC 400 MG PO TABS
400.0000 mg | ORAL_TABLET | Freq: Two times a day (BID) | ORAL | Status: DC
Start: 2014-10-26 — End: 2014-10-30
  Administered 2014-10-26 – 2014-10-30 (×8): 400 mg via ORAL
  Filled 2014-10-26 (×10): qty 1

## 2014-10-26 MED ORDER — EPHEDRINE SULFATE 50 MG/ML IJ SOLN
INTRAMUSCULAR | Status: AC
  Filled 2014-10-26: qty 1

## 2014-10-26 MED ORDER — HYDROMORPHONE HCL 1 MG/ML IJ SOLN
0.2500 mg | INTRAMUSCULAR | Status: DC | PRN
  Administered 2014-10-26: 0.5 mg via INTRAVENOUS
  Administered 2014-10-26 (×2): 0.25 mg via INTRAVENOUS

## 2014-10-26 MED ORDER — PROPOFOL 10 MG/ML IV BOLUS
INTRAVENOUS | Status: AC
  Filled 2014-10-26: qty 20

## 2014-10-26 MED ORDER — FENTANYL CITRATE 0.05 MG/ML IJ SOLN
INTRAMUSCULAR | Status: AC
  Filled 2014-10-26: qty 5

## 2014-10-26 MED ORDER — ACETAMINOPHEN 325 MG PO TABS: 650.0000 mg | ORAL_TABLET | ORAL | Status: DC | PRN

## 2014-10-26 MED ORDER — LACTATED RINGERS IV SOLN
INTRAVENOUS | Status: DC | PRN
  Administered 2014-10-26: 13:00:00 via INTRAVENOUS

## 2014-10-26 MED ORDER — DEXAMETHASONE SODIUM PHOSPHATE 10 MG/ML IJ SOLN
INTRAMUSCULAR | Status: DC | PRN
  Administered 2014-10-26: 10 mg via INTRAVENOUS

## 2014-10-26 MED ORDER — ASPIRIN EC 81 MG PO TBEC
81.0000 mg | DELAYED_RELEASE_TABLET | Freq: Every day | ORAL | Status: DC
  Administered 2014-10-27 – 2014-10-30 (×4): 81 mg via ORAL
  Filled 2014-10-26 (×5): qty 1

## 2014-10-26 MED ORDER — VECURONIUM BROMIDE 10 MG IV SOLR
INTRAVENOUS | Status: AC
  Filled 2014-10-26: qty 10

## 2014-10-26 MED ORDER — METHOCARBAMOL 1000 MG/10ML IJ SOLN
500.0000 mg | Freq: Four times a day (QID) | INTRAVENOUS | Status: DC | PRN
  Filled 2014-10-26: qty 5

## 2014-10-26 MED ORDER — GLYCOPYRROLATE 0.2 MG/ML IJ SOLN
INTRAMUSCULAR | Status: DC | PRN
  Administered 2014-10-26: 0.6 mg via INTRAVENOUS

## 2014-10-26 MED ORDER — SENNA 8.6 MG PO TABS
1.0000 | ORAL_TABLET | Freq: Two times a day (BID) | ORAL | Status: DC
  Administered 2014-10-26 – 2014-10-30 (×7): 8.6 mg via ORAL
  Filled 2014-10-26 (×9): qty 1

## 2014-10-26 MED ORDER — LEVOTHYROXINE SODIUM 50 MCG PO TABS
150.0000 ug | ORAL_TABLET | Freq: Every day | ORAL | Status: DC
  Administered 2014-10-27 – 2014-10-30 (×4): 150 ug via ORAL
  Filled 2014-10-26 (×8): qty 1

## 2014-10-26 MED ORDER — DULOXETINE HCL 60 MG PO CPEP
60.0000 mg | ORAL_CAPSULE | Freq: Every day | ORAL | Status: DC
  Administered 2014-10-26 – 2014-10-30 (×5): 60 mg via ORAL
  Filled 2014-10-26 (×5): qty 1

## 2014-10-26 MED ORDER — GLYCOPYRROLATE 0.2 MG/ML IJ SOLN
INTRAMUSCULAR | Status: AC
  Filled 2014-10-26: qty 3

## 2014-10-26 MED ORDER — CETYLPYRIDINIUM CHLORIDE 0.05 % MT LIQD
7.0000 mL | Freq: Two times a day (BID) | OROMUCOSAL | Status: DC
  Administered 2014-10-26 – 2014-10-30 (×7): 7 mL via OROMUCOSAL

## 2014-10-26 MED ORDER — OXYCODONE-ACETAMINOPHEN 5-325 MG PO TABS
1.0000 | ORAL_TABLET | ORAL | Status: DC | PRN
Start: 2014-10-26 — End: 2014-10-30
  Administered 2014-10-27 – 2014-10-30 (×11): 2 via ORAL
  Filled 2014-10-26 (×11): qty 2

## 2014-10-26 MED ORDER — VITAMIN C 500 MG PO TABS
500.0000 mg | ORAL_TABLET | Freq: Two times a day (BID) | ORAL | Status: DC
  Administered 2014-10-26 – 2014-10-30 (×8): 500 mg via ORAL
  Filled 2014-10-26 (×9): qty 1

## 2014-10-26 MED ORDER — CEFAZOLIN SODIUM-DEXTROSE 2-3 GM-% IV SOLR
INTRAVENOUS | Status: AC
  Filled 2014-10-26: qty 50

## 2014-10-26 MED ORDER — HYDROCODONE-ACETAMINOPHEN 5-325 MG PO TABS
1.0000 | ORAL_TABLET | ORAL | Status: DC | PRN
  Administered 2014-10-26 – 2014-10-29 (×6): 2 via ORAL
  Filled 2014-10-26 (×6): qty 2

## 2014-10-26 MED ORDER — 0.9 % SODIUM CHLORIDE (POUR BTL) OPTIME
TOPICAL | Status: DC | PRN
  Administered 2014-10-26 (×3): 1000 mL

## 2014-10-26 MED ORDER — DESMOPRESSIN ACETATE 0.2 MG PO TABS
0.2000 mg | ORAL_TABLET | Freq: Two times a day (BID) | ORAL | Status: DC
  Administered 2014-10-26 – 2014-10-30 (×8): 0.2 mg via ORAL
  Filled 2014-10-26 (×9): qty 1

## 2014-10-26 MED ORDER — HYDROCORTISONE 5 MG PO TABS
5.0000 mg | ORAL_TABLET | Freq: Two times a day (BID) | ORAL | Status: DC
  Administered 2014-10-26 – 2014-10-30 (×8): 5 mg via ORAL
  Filled 2014-10-26 (×9): qty 1

## 2014-10-26 MED ORDER — NEOSTIGMINE METHYLSULFATE 10 MG/10ML IV SOLN
INTRAVENOUS | Status: DC | PRN
  Administered 2014-10-26: 4 mg via INTRAVENOUS

## 2014-10-26 MED ORDER — LOSARTAN POTASSIUM 50 MG PO TABS
100.0000 mg | ORAL_TABLET | Freq: Every day | ORAL | Status: DC
  Administered 2014-10-26 – 2014-10-30 (×5): 100 mg via ORAL
  Filled 2014-10-26 (×5): qty 2

## 2014-10-26 MED ORDER — SODIUM CHLORIDE 0.9 % IJ SOLN
3.0000 mL | Freq: Two times a day (BID) | INTRAMUSCULAR | Status: DC
  Administered 2014-10-26 – 2014-10-30 (×7): 3 mL via INTRAVENOUS

## 2014-10-26 MED ORDER — VITAMIN D 1000 UNITS PO TABS
2000.0000 [IU] | ORAL_TABLET | Freq: Every day | ORAL | Status: DC
  Administered 2014-10-26 – 2014-10-30 (×5): 2000 [IU] via ORAL
  Filled 2014-10-26 (×2): qty 1
  Filled 2014-10-26: qty 2
  Filled 2014-10-26: qty 1
  Filled 2014-10-26 (×3): qty 2

## 2014-10-26 MED ORDER — BUPIVACAINE HCL (PF) 0.5 % IJ SOLN
INTRAMUSCULAR | Status: DC | PRN
  Administered 2014-10-26: 10 mL

## 2014-10-26 MED ORDER — MENTHOL 3 MG MT LOZG: 1.0000 | LOZENGE | OROMUCOSAL | Status: DC | PRN

## 2014-10-26 MED ORDER — STERILE WATER FOR INJECTION IJ SOLN
INTRAMUSCULAR | Status: AC
  Filled 2014-10-26: qty 10

## 2014-10-26 MED ORDER — THROMBIN 20000 UNITS EX SOLR
CUTANEOUS | Status: DC | PRN
  Administered 2014-10-26: 12:00:00 via TOPICAL

## 2014-10-26 MED ORDER — ACETAMINOPHEN 650 MG RE SUPP: 650.0000 mg | RECTAL | Status: DC | PRN

## 2014-10-26 MED ORDER — HYDROCORTISONE NA SUCCINATE PF 100 MG IJ SOLR
INTRAMUSCULAR | Status: DC | PRN
  Administered 2014-10-26: 100 mg via INTRAVENOUS

## 2014-10-26 MED ORDER — LOSARTAN POTASSIUM 50 MG PO TABS
100.0000 mg | ORAL_TABLET | Freq: Every day | ORAL | Status: DC
  Filled 2014-10-26: qty 2

## 2014-10-26 MED ORDER — CYCLOBENZAPRINE HCL 10 MG PO TABS
10.0000 mg | ORAL_TABLET | Freq: Three times a day (TID) | ORAL | Status: DC | PRN
  Administered 2014-10-28 – 2014-10-29 (×3): 10 mg via ORAL
  Filled 2014-10-26 (×4): qty 1

## 2014-10-26 MED ORDER — OXYCODONE HCL 5 MG PO TABS
ORAL_TABLET | ORAL | Status: AC
  Filled 2014-10-26: qty 1

## 2014-10-26 MED ORDER — DEXTROSE 5 % IV SOLN
10.0000 mg | INTRAVENOUS | Status: DC | PRN
  Administered 2014-10-26: 50 ug/min via INTRAVENOUS

## 2014-10-26 MED ORDER — VASOPRESSIN BOLUS VIA INFUSION
INTRAVENOUS | Status: DC | PRN
  Administered 2014-10-26 (×4): 1 [IU] via INTRAVENOUS

## 2014-10-26 MED ORDER — ARTIFICIAL TEARS OP OINT
TOPICAL_OINTMENT | OPHTHALMIC | Status: AC
  Filled 2014-10-26: qty 3.5

## 2014-10-26 MED ORDER — PROMETHAZINE HCL 25 MG/ML IJ SOLN: 6.2500 mg | INTRAMUSCULAR | Status: DC | PRN

## 2014-10-26 MED ORDER — SODIUM CHLORIDE 0.9 % IJ SOLN: 3.0000 mL | INTRAMUSCULAR | Status: DC | PRN

## 2014-10-26 MED ORDER — ATORVASTATIN CALCIUM 10 MG PO TABS
10.0000 mg | ORAL_TABLET | Freq: Every day | ORAL | Status: DC
  Administered 2014-10-26 – 2014-10-30 (×5): 10 mg via ORAL
  Filled 2014-10-26 (×5): qty 1

## 2014-10-26 MED ORDER — SODIUM CHLORIDE 0.9 % IV SOLN
INTRAVENOUS | Status: DC | PRN
  Administered 2014-10-26: 15:00:00 via INTRAVENOUS

## 2014-10-26 MED ORDER — VECURONIUM BROMIDE 10 MG IV SOLR
INTRAVENOUS | Status: DC | PRN
  Administered 2014-10-26 (×2): 2 mg via INTRAVENOUS
  Administered 2014-10-26: 3 mg via INTRAVENOUS
  Administered 2014-10-26: 1 mg via INTRAVENOUS

## 2014-10-26 MED ORDER — KCL IN DEXTROSE-NACL 20-5-0.45 MEQ/L-%-% IV SOLN
INTRAVENOUS | Status: DC
  Administered 2014-10-26: 21:00:00 via INTRAVENOUS
  Filled 2014-10-26 (×4): qty 1000

## 2014-10-26 MED ORDER — LIDOCAINE HCL (CARDIAC) 20 MG/ML IV SOLN
INTRAVENOUS | Status: AC
  Filled 2014-10-26: qty 5

## 2014-10-26 MED ORDER — LIDOCAINE 5 % EX PTCH
1.0000 | MEDICATED_PATCH | CUTANEOUS | Status: DC
  Administered 2014-10-26 – 2014-10-29 (×4): 1 via TRANSDERMAL
  Filled 2014-10-26 (×5): qty 1

## 2014-10-26 MED ORDER — DOCUSATE SODIUM 100 MG PO CAPS
100.0000 mg | ORAL_CAPSULE | Freq: Two times a day (BID) | ORAL | Status: DC
  Administered 2014-10-26 – 2014-10-30 (×8): 100 mg via ORAL
  Filled 2014-10-26 (×9): qty 1

## 2014-10-26 MED ORDER — ONDANSETRON HCL 4 MG/2ML IJ SOLN
INTRAMUSCULAR | Status: DC | PRN
  Administered 2014-10-26: 4 mg via INTRAVENOUS

## 2014-10-26 MED ORDER — ONDANSETRON HCL 4 MG/2ML IJ SOLN: 4.0000 mg | INTRAMUSCULAR | Status: DC | PRN

## 2014-10-26 MED ORDER — TRAZODONE HCL 100 MG PO TABS
100.0000 mg | ORAL_TABLET | Freq: Every day | ORAL | Status: DC
  Administered 2014-10-26 – 2014-10-29 (×4): 100 mg via ORAL
  Filled 2014-10-26 (×5): qty 1

## 2014-10-26 MED ORDER — SODIUM CHLORIDE 0.9 % IV SOLN
0.0300 [IU]/min | INTRAVENOUS | Status: AC
  Administered 2014-10-26: .06 [IU]/min via INTRAVENOUS
  Filled 2014-10-26: qty 2

## 2014-10-26 MED ORDER — PSYLLIUM 95 % PO PACK
1.0000 | PACK | Freq: Two times a day (BID) | ORAL | Status: DC
  Administered 2014-10-26 – 2014-10-30 (×8): 1 via ORAL
  Filled 2014-10-26 (×9): qty 1

## 2014-10-26 MED ORDER — PHENOL 1.4 % MT LIQD: 1.0000 | OROMUCOSAL | Status: DC | PRN

## 2014-10-26 MED ORDER — CALCIUM CARBONATE 600 MG PO TABS
600.0000 mg | ORAL_TABLET | Freq: Two times a day (BID) | ORAL | Status: DC
  Filled 2014-10-26 (×2): qty 1

## 2014-10-26 MED ORDER — DEXAMETHASONE SODIUM PHOSPHATE 4 MG/ML IJ SOLN
4.0000 mg | Freq: Four times a day (QID) | INTRAMUSCULAR | Status: DC
  Administered 2014-10-26: 4 mg via INTRAVENOUS
  Filled 2014-10-26 (×6): qty 1

## 2014-10-26 MED ORDER — ACETAMINOPHEN-CODEINE #3 300-30 MG PO TABS: 1.0000 | ORAL_TABLET | ORAL | Status: DC | PRN

## 2014-10-26 MED ORDER — TESTOSTERONE 30 MG/ACT TD SOLN: 30.0000 mg | Freq: Four times a day (QID) | TRANSDERMAL | Status: DC

## 2014-10-26 MED ORDER — OXYCODONE HCL 5 MG/5ML PO SOLN: 5.0000 mg | Freq: Once | ORAL | Status: AC | PRN

## 2014-10-26 MED ORDER — DULOXETINE HCL 60 MG PO CPEP
60.0000 mg | ORAL_CAPSULE | Freq: Every day | ORAL | Status: DC
  Filled 2014-10-26: qty 1

## 2014-10-26 MED ORDER — ROCURONIUM BROMIDE 100 MG/10ML IV SOLN
INTRAVENOUS | Status: DC | PRN
  Administered 2014-10-26: 50 mg via INTRAVENOUS

## 2014-10-26 MED ORDER — ALUM & MAG HYDROXIDE-SIMETH 200-200-20 MG/5ML PO SUSP: 30.0000 mL | Freq: Four times a day (QID) | ORAL | Status: DC | PRN

## 2014-10-26 MED ORDER — HYDROCODONE-ACETAMINOPHEN 5-325 MG PO TABS: 1.0000 | ORAL_TABLET | ORAL | Status: DC | PRN

## 2014-10-26 MED ORDER — TAMSULOSIN HCL 0.4 MG PO CAPS
0.4000 mg | ORAL_CAPSULE | Freq: Every day | ORAL | Status: DC
  Administered 2014-10-26 – 2014-10-29 (×4): 0.4 mg via ORAL
  Filled 2014-10-26 (×5): qty 1

## 2014-10-26 MED ORDER — CALCIUM CARBONATE 1250 (500 CA) MG PO TABS
1.0000 | ORAL_TABLET | Freq: Two times a day (BID) | ORAL | Status: DC
  Administered 2014-10-27 – 2014-10-30 (×7): 500 mg via ORAL
  Filled 2014-10-26 (×10): qty 1

## 2014-10-26 MED ORDER — TAMSULOSIN HCL 0.4 MG PO CAPS
0.4000 mg | ORAL_CAPSULE | Freq: Every day | ORAL | Status: DC
  Filled 2014-10-26: qty 1

## 2014-10-26 MED ORDER — ADULT MULTIVITAMIN W/MINERALS CH
1.0000 | ORAL_TABLET | Freq: Every day | ORAL | Status: DC
  Administered 2014-10-26 – 2014-10-30 (×5): 1 via ORAL
  Filled 2014-10-26 (×5): qty 1

## 2014-10-26 MED ORDER — LACTATED RINGERS IV SOLN
INTRAVENOUS | Status: DC
  Administered 2014-10-26 (×3): via INTRAVENOUS

## 2014-10-26 MED ORDER — MIDAZOLAM HCL 5 MG/5ML IJ SOLN
INTRAMUSCULAR | Status: DC | PRN
  Administered 2014-10-26: 2 mg via INTRAVENOUS

## 2014-10-26 MED ORDER — METHOCARBAMOL 500 MG PO TABS
ORAL_TABLET | ORAL | Status: AC
  Filled 2014-10-26: qty 1

## 2014-10-26 MED ORDER — ALPRAZOLAM 0.5 MG PO TABS
0.5000 mg | ORAL_TABLET | Freq: Every evening | ORAL | Status: DC | PRN
Start: 2014-10-26 — End: 2014-10-30

## 2014-10-26 MED ORDER — DEXAMETHASONE SODIUM PHOSPHATE 10 MG/ML IJ SOLN
INTRAMUSCULAR | Status: AC
  Filled 2014-10-26: qty 1

## 2014-10-26 MED ORDER — PANTOPRAZOLE SODIUM 40 MG IV SOLR
40.0000 mg | Freq: Every day | INTRAVENOUS | Status: DC
  Administered 2014-10-26: 40 mg via INTRAVENOUS
  Filled 2014-10-26 (×2): qty 40

## 2014-10-26 MED ORDER — BUPIVACAINE LIPOSOME 1.3 % IJ SUSP
INTRAMUSCULAR | Status: DC | PRN
  Administered 2014-10-26: 20 mL

## 2014-10-26 MED ORDER — EPINEPHRINE HCL 0.1 MG/ML IJ SOSY
PREFILLED_SYRINGE | INTRAMUSCULAR | Status: DC | PRN
  Administered 2014-10-26: 20 ug via INTRAVENOUS
  Administered 2014-10-26 (×3): 10 ug via INTRAVENOUS
  Administered 2014-10-26 (×3): 20 ug via INTRAVENOUS
  Administered 2014-10-26 (×5): 10 ug via INTRAVENOUS
  Administered 2014-10-26: 5 ug via INTRAVENOUS
  Administered 2014-10-26: 20 ug via INTRAVENOUS
  Administered 2014-10-26: 30 ug via INTRAVENOUS
  Administered 2014-10-26: 20 ug via INTRAVENOUS
  Administered 2014-10-26 (×3): 10 ug via INTRAVENOUS
  Administered 2014-10-26: 20 ug via INTRAVENOUS
  Administered 2014-10-26: 5 ug via INTRAVENOUS
  Administered 2014-10-26: 30 ug via INTRAVENOUS
  Administered 2014-10-26: 5 ug via INTRAVENOUS
  Administered 2014-10-26: 20 ug via INTRAVENOUS
  Administered 2014-10-26: 5 ug via INTRAVENOUS
  Administered 2014-10-26 (×2): 20 ug via INTRAVENOUS
  Administered 2014-10-26: 10 ug via INTRAVENOUS
  Administered 2014-10-26 (×2): 20 ug via INTRAVENOUS
  Administered 2014-10-26: 10 ug via INTRAVENOUS
  Administered 2014-10-26: 5 ug via INTRAVENOUS
  Administered 2014-10-26 (×2): 10 ug via INTRAVENOUS

## 2014-10-26 MED ORDER — SODIUM CHLORIDE 0.9 % IV SOLN: 250.0000 mL | INTRAVENOUS | Status: DC

## 2014-10-26 MED ORDER — CEFAZOLIN SODIUM 1-5 GM-% IV SOLN
1.0000 g | Freq: Three times a day (TID) | INTRAVENOUS | Status: AC
  Administered 2014-10-27: 1 g via INTRAVENOUS
  Filled 2014-10-26 (×2): qty 50

## 2014-10-26 MED ORDER — EPHEDRINE SULFATE 50 MG/ML IJ SOLN
INTRAMUSCULAR | Status: DC | PRN
  Administered 2014-10-26: 15 mg via INTRAVENOUS
  Administered 2014-10-26: 10 mg via INTRAVENOUS
  Administered 2014-10-26: 25 mg via INTRAVENOUS
  Administered 2014-10-26: 10 mg via INTRAVENOUS

## 2014-10-26 MED ORDER — LIDOCAINE HCL (CARDIAC) 20 MG/ML IV SOLN
INTRAVENOUS | Status: DC | PRN
  Administered 2014-10-26: 100 mg via INTRAVENOUS

## 2014-10-26 MED ORDER — ASPIRIN 81 MG PO TABS
81.0000 mg | ORAL_TABLET | Freq: Every day | ORAL | Status: DC
  Filled 2014-10-26: qty 1

## 2014-10-26 MED ORDER — ATORVASTATIN CALCIUM 10 MG PO TABS: 10.0000 mg | ORAL_TABLET | Freq: Every day | ORAL | Status: DC

## 2014-10-26 MED ORDER — FENTANYL CITRATE 0.05 MG/ML IJ SOLN
INTRAMUSCULAR | Status: DC | PRN
  Administered 2014-10-26: 100 ug via INTRAVENOUS
  Administered 2014-10-26 (×4): 25 ug via INTRAVENOUS
  Administered 2014-10-26 (×3): 50 ug via INTRAVENOUS
  Administered 2014-10-26: 25 ug via INTRAVENOUS
  Administered 2014-10-26: 100 ug via INTRAVENOUS
  Administered 2014-10-26: 25 ug via INTRAVENOUS

## 2014-10-26 MED ORDER — ONDANSETRON HCL 4 MG/2ML IJ SOLN
INTRAMUSCULAR | Status: AC
  Filled 2014-10-26: qty 2

## 2014-10-26 MED ORDER — BUPIVACAINE LIPOSOME 1.3 % IJ SUSP
20.0000 mL | Freq: Once | INTRAMUSCULAR | Status: DC
  Filled 2014-10-26: qty 20

## 2014-10-26 MED ORDER — HYDROMORPHONE HCL 1 MG/ML IJ SOLN
0.5000 mg | INTRAMUSCULAR | Status: DC | PRN
  Administered 2014-10-27 (×2): 1 mg via INTRAVENOUS
  Filled 2014-10-26 (×3): qty 1

## 2014-10-26 MED ORDER — OXYCODONE HCL 5 MG PO TABS
5.0000 mg | ORAL_TABLET | Freq: Once | ORAL | Status: AC | PRN
  Administered 2014-10-26: 5 mg via ORAL

## 2014-10-26 MED ORDER — ALBUMIN HUMAN 5 % IV SOLN
INTRAVENOUS | Status: DC | PRN
  Administered 2014-10-26 (×2): via INTRAVENOUS

## 2014-10-26 MED ORDER — PROPOFOL 10 MG/ML IV BOLUS
INTRAVENOUS | Status: DC | PRN
  Administered 2014-10-26: 200 mg via INTRAVENOUS

## 2014-10-26 MED ORDER — LIDOCAINE-EPINEPHRINE 1 %-1:100000 IJ SOLN
INTRAMUSCULAR | Status: DC | PRN
  Administered 2014-10-26: 10 mL

## 2014-10-26 MED ORDER — ATORVASTATIN CALCIUM 10 MG PO TABS
10.0000 mg | ORAL_TABLET | Freq: Every day | ORAL | Status: DC
  Filled 2014-10-26: qty 1

## 2014-10-26 MED ORDER — METHOCARBAMOL 500 MG PO TABS
500.0000 mg | ORAL_TABLET | Freq: Four times a day (QID) | ORAL | Status: DC | PRN
Start: 2014-10-26 — End: 2014-10-30
  Administered 2014-10-26: 500 mg via ORAL
  Filled 2014-10-26: qty 1

## 2014-10-26 MED ORDER — DEXAMETHASONE 4 MG PO TABS
4.0000 mg | ORAL_TABLET | Freq: Four times a day (QID) | ORAL | Status: DC
  Administered 2014-10-27 – 2014-10-30 (×15): 4 mg via ORAL
  Filled 2014-10-26 (×18): qty 1

## 2014-10-26 MED ORDER — MIDAZOLAM HCL 2 MG/2ML IJ SOLN
INTRAMUSCULAR | Status: AC
  Filled 2014-10-26: qty 2

## 2014-10-26 SURGICAL SUPPLY — 87 items
BENZOIN TINCTURE PRP APPL 2/3 (GAUZE/BANDAGES/DRESSINGS) IMPLANT
BLADE CLIPPER SURG (BLADE) ×3 IMPLANT
BONE MATRIX OSTEOCEL PRO MED (Bone Implant) ×9 IMPLANT
BUR MATCHSTICK NEURO 3.0 LAGG (BURR) ×3 IMPLANT
BUR PRECISION FLUTE 5.0 (BURR) ×3 IMPLANT
CAGE COROENT LC 10X9X25 (Cage) ×2 IMPLANT
CAGE COROENT LC 10X9X25MM (Cage) ×1 IMPLANT
CAGE MAS PLIF 9X9X23-8 LUMBAR (Cage) ×6 IMPLANT
CANISTER SUCT 3000ML PPV (MISCELLANEOUS) ×3 IMPLANT
CLOSURE WOUND 1/2 X4 (GAUZE/BANDAGES/DRESSINGS) ×1
CONNECTOR RELINE 45-65 5.5 (Connector) ×3 IMPLANT
CONT SPEC 4OZ CLIKSEAL STRL BL (MISCELLANEOUS) ×6 IMPLANT
COVER BACK TABLE 60X90IN (DRAPES) ×3 IMPLANT
DECANTER SPIKE VIAL GLASS SM (MISCELLANEOUS) ×3 IMPLANT
DRAPE C-ARM 42X72 X-RAY (DRAPES) ×6 IMPLANT
DRAPE C-ARMOR (DRAPES) ×3 IMPLANT
DRAPE LAPAROTOMY 100X72X124 (DRAPES) ×3 IMPLANT
DRAPE POUCH INSTRU U-SHP 10X18 (DRAPES) ×3 IMPLANT
DRAPE SURG 17X23 STRL (DRAPES) ×3 IMPLANT
DRSG OPSITE POSTOP 4X10 (GAUZE/BANDAGES/DRESSINGS) ×3 IMPLANT
DRSG TELFA 3X8 NADH (GAUZE/BANDAGES/DRESSINGS) IMPLANT
DURAPREP 26ML APPLICATOR (WOUND CARE) ×3 IMPLANT
ELECT BLADE 4.0 EZ CLEAN MEGAD (MISCELLANEOUS) ×3
ELECT REM PT RETURN 9FT ADLT (ELECTROSURGICAL) ×3
ELECTRODE BLDE 4.0 EZ CLN MEGD (MISCELLANEOUS) ×1 IMPLANT
ELECTRODE REM PT RTRN 9FT ADLT (ELECTROSURGICAL) ×1 IMPLANT
EVACUATOR 1/8 PVC DRAIN (DRAIN) ×3 IMPLANT
GAUZE SPONGE 4X4 12PLY STRL (GAUZE/BANDAGES/DRESSINGS) ×3 IMPLANT
GAUZE SPONGE 4X4 16PLY XRAY LF (GAUZE/BANDAGES/DRESSINGS) IMPLANT
GLOVE BIO SURGEON STRL SZ 6.5 (GLOVE) ×2 IMPLANT
GLOVE BIO SURGEON STRL SZ8 (GLOVE) ×6 IMPLANT
GLOVE BIO SURGEON STRL SZ8.5 (GLOVE) ×3 IMPLANT
GLOVE BIO SURGEONS STRL SZ 6.5 (GLOVE) ×1
GLOVE BIOGEL PI IND STRL 6.5 (GLOVE) ×1 IMPLANT
GLOVE BIOGEL PI IND STRL 8 (GLOVE) ×5 IMPLANT
GLOVE BIOGEL PI IND STRL 8.5 (GLOVE) ×2 IMPLANT
GLOVE BIOGEL PI INDICATOR 6.5 (GLOVE) ×2
GLOVE BIOGEL PI INDICATOR 8 (GLOVE) ×10
GLOVE BIOGEL PI INDICATOR 8.5 (GLOVE) ×4
GLOVE ECLIPSE 7.5 STRL STRAW (GLOVE) ×15 IMPLANT
GLOVE ECLIPSE 8.0 STRL XLNG CF (GLOVE) ×6 IMPLANT
GLOVE EXAM NITRILE LRG STRL (GLOVE) IMPLANT
GLOVE EXAM NITRILE MD LF STRL (GLOVE) IMPLANT
GLOVE EXAM NITRILE XL STR (GLOVE) IMPLANT
GLOVE EXAM NITRILE XS STR PU (GLOVE) IMPLANT
GLOVE SS BIOGEL STRL SZ 8 (GLOVE) ×1 IMPLANT
GLOVE SUPERSENSE BIOGEL SZ 8 (GLOVE) ×2
GOWN STRL REUS W/ TWL LRG LVL3 (GOWN DISPOSABLE) ×1 IMPLANT
GOWN STRL REUS W/ TWL XL LVL3 (GOWN DISPOSABLE) ×3 IMPLANT
GOWN STRL REUS W/TWL 2XL LVL3 (GOWN DISPOSABLE) ×12 IMPLANT
GOWN STRL REUS W/TWL LRG LVL3 (GOWN DISPOSABLE) ×2
GOWN STRL REUS W/TWL XL LVL3 (GOWN DISPOSABLE) ×6
KIT BASIN OR (CUSTOM PROCEDURE TRAY) ×3 IMPLANT
KIT POSITION SURG JACKSON T1 (MISCELLANEOUS) ×3 IMPLANT
KIT ROOM TURNOVER OR (KITS) ×3 IMPLANT
LIQUID BAND (GAUZE/BANDAGES/DRESSINGS) ×3 IMPLANT
MILL MEDIUM DISP (BLADE) ×3 IMPLANT
NEEDLE HYPO 21X1.5 SAFETY (NEEDLE) ×3 IMPLANT
NEEDLE HYPO 25X1 1.5 SAFETY (NEEDLE) ×3 IMPLANT
NEEDLE SPNL 18GX3.5 QUINCKE PK (NEEDLE) IMPLANT
NS IRRIG 1000ML POUR BTL (IV SOLUTION) ×3 IMPLANT
PACK LAMINECTOMY NEURO (CUSTOM PROCEDURE TRAY) ×3 IMPLANT
PAD ARMBOARD 7.5X6 YLW CONV (MISCELLANEOUS) ×9 IMPLANT
PATTIES SURGICAL .5 X.5 (GAUZE/BANDAGES/DRESSINGS) IMPLANT
PATTIES SURGICAL .5 X1 (DISPOSABLE) IMPLANT
PATTIES SURGICAL 1X1 (DISPOSABLE) IMPLANT
ROD RELINE-O 5.5X100 LORD (Rod) ×6 IMPLANT
SCREW LOCK RELINE 5.5 TULIP (Screw) ×30 IMPLANT
SCREW RELINE-O POLY 6.5X45 (Screw) ×18 IMPLANT
SCREW RELINE-O POLY 6.5X50MM (Screw) ×12 IMPLANT
SPONGE LAP 4X18 X RAY DECT (DISPOSABLE) IMPLANT
SPONGE SURGIFOAM ABS GEL 100 (HEMOSTASIS) ×3 IMPLANT
STAPLER SKIN PROX WIDE 3.9 (STAPLE) IMPLANT
STRIP CLOSURE SKIN 1/2X4 (GAUZE/BANDAGES/DRESSINGS) ×2 IMPLANT
SUT VIC AB 1 CT1 18XBRD ANBCTR (SUTURE) ×2 IMPLANT
SUT VIC AB 1 CT1 8-18 (SUTURE) ×4
SUT VIC AB 2-0 CT1 18 (SUTURE) ×6 IMPLANT
SUT VIC AB 3-0 SH 8-18 (SUTURE) ×6 IMPLANT
SYR 20CC LL (SYRINGE) ×3 IMPLANT
SYR 3ML LL SCALE MARK (SYRINGE) IMPLANT
SYR 5ML LL (SYRINGE) IMPLANT
TOWEL OR 17X24 6PK STRL BLUE (TOWEL DISPOSABLE) ×3 IMPLANT
TOWEL OR 17X26 10 PK STRL BLUE (TOWEL DISPOSABLE) ×3 IMPLANT
TRAP SPECIMEN MUCOUS 40CC (MISCELLANEOUS) ×3 IMPLANT
TRAY FOLEY CATH 14FRSI W/METER (CATHETERS) IMPLANT
TRAY FOLEY CATH 16FRSI W/METER (SET/KITS/TRAYS/PACK) ×3 IMPLANT
WATER STERILE IRR 1000ML POUR (IV SOLUTION) ×3 IMPLANT

## 2014-10-26 NOTE — Op Note (Signed)
10/26/2014  4:31 PM  PATIENT:  Troy Collier  69 y.o. male  PRE-OPERATIVE DIAGNOSIS:  Scoliosis and kyphoscoliosis, idiopathic, Spondylolisthesis, Spinal Stenosis, lumbar region, Radiculopathy, lumbar region L 23, L 34, L 45, L 5 S 1 levels  POST-OPERATIVE DIAGNOSIS:   Scoliosis and kyphoscoliosis, idiopathic, Spondylolisthesis, Spinal Stenosis, lumbar region, Radiculopathy, lumbar region L 23, L 34, L 45, L 5 S 1 levels  PROCEDURE:  Procedure(s) with comments: Lumbar two-three, Lumba three-four Posterior Lateral Arthrodesis, Lumbar four-five TLIF Interbody Fusion, Lumbar five-Sacral one Posterior lumbar interbody fusion (N/A) - L2-3 L3-4 L4-5 L5-S1 Pedicle screw fixation and posterolateral arthrodesis, decompressive laminectomy L 23, L 34, L 45, L 5 S 1 levels; decompression greater than standard PLIF procedure  SURGEON:  Surgeon(s) and Role:    * Erline Levine, MD - Primary  PHYSICIAN ASSISTANT:   ASSISTANTS: [place], RN   ANESTHESIA:   general  EBL:  Total I/O In: 3225 [I.V.:2350; Blood:375; IV Piggyback:500] Out: 1185 [Urine:685; Blood:500]  BLOOD ADMINISTERED:375 CC CELLSAVER  DRAINS: (Medium) Hemovact drain(s) in the epidural space with  Suction Open   LOCAL MEDICATIONS USED:  MARCAINE     SPECIMEN:  No Specimen  DISPOSITION OF SPECIMEN:  N/A  COUNTS:  YES  TOURNIQUET:  * No tourniquets in log *  DICTATION: Patient is 69 year old man with lumbar scoliosis and spondylolisthesis L 4/5 with severe spinal stenosis and a long history of severe back and bilateral leg pain, left greater than right. It was elected to take him to surgery for decompression and fusion from L2/3 through L5/S1 levels.   Procedure: Patient was placed in a prone position on the [place] table after smooth and uncomplicated induction of general endotracheal anesthesia. His low back was prepped and draped in usual sterile fashion with DuraPrep. Area of incision was infiltrated with local lidocaine. Incision  was made to the lumbodorsal fascia was incised and exposure was performed of the L2-S1 spinous processes laminae facet joint and transverse processes. Intraoperative x-ray was obtained which confirmed correct orientation with marker probes at L2 through sacral levels. A total laminectomy of L2 through L5 levels was performed with disarticulation of the facet joints and thorough decompression was performed of both L2, L3, L4, L5, S1   S1 nerve roots along with the common dural tube. Decompression was greater than would be typical for PLIF. There was an old hematoma within the ligamentum flavum at L 45 on the right.  There was densely adherent scar tissue along the thecal sac, particularly at the L 45 level on the right.  I was unable to mobilize the right L 4 nerve root at this level, and consequently elected to perform a TLIF procedure, rather than PLIF at this level.  A  thorough discectomy was then performed on the left with preparation of the endplates for grafting a trial spacer was placed this level (L 45) and  After 10 cc of autograft was packed in the interspace, a 10 mm TLIF cage was inserted and countersunk appropriately. .  After trial sizing and utilization of sequential shavers, interspaces were packed with BMP, autograft and PEEK cages with NexOss Bone graft extender.  Bone autograft was packed within the interspace bilaterally along with large BMP kit and NexOss bone graft extender. Bilateral median 9 x 23 x 8 degree peek cages were packed with autograft and  extender and was inserted the interspace and countersunk appropriately at the L 5/S1 level. 10 cc graft material was packed medial to the second cage. There  was good correction of scoliotic curvature. I elected not to place interbody cages at the L 23 and L 34 levels. The posterolateral region was extensively decorticated and pedicle probes were placed at L2 through S1 bilaterally. Intraoperative fluoroscopy confirmed correct orientationin the AP  and lateral plane. 45 x 6.5 mm pedicle screws were placed at S1 bilaterally and 45 x 6.5 mm screws placed at L 3, L 5  bilaterally and 6.5 x 71m screws were placed at L 2 and L 4 bilaterally. Final x-rays demonstrated well-positioned interbody grafts and pedicle screw fixation. A 110 mm lordotic rod was placed on the right and a 110 mm rod was placed on the left locked down in situ and the posterolateral region was packed with the remaining autograft and bone graft extender on the right (30 cc) and  on the left (30 cc). The wound was irrigated. Long-acting Marcaine was injected in the subcutaneous tissues.  A Medium Hemovac drain was placed. Fascia was closed with 1 Vicryl sutures skin edges were reapproximated 2 and 3-0 Vicryl sutures. The wound was dressed with Dermabond and an occlusive dressing.  the patient was extubated in the operating room and taken to recovery in stable satisfactory condition he tolerated traction well counts were correct at the end of the case.   PLAN OF CARE: Admit to inpatient   PATIENT DISPOSITION:  PACU - hemodynamically stable.   Delay start of Pharmacological VTE agent (>24hrs) due to surgical blood loss or risk of bleeding: yes

## 2014-10-26 NOTE — Consult Note (Signed)
PULMONARY / CRITICAL CARE MEDICINE   Name: Troy Collier MRN: [id number] DOB: 1946-05-02    ADMISSION DATE:  10/26/2014 CONSULTATION DATE:  10/26/2014  REFERRING MD :  Venetia Maxon  CHIEF COMPLAINT:  Perioperative hypotension  INITIAL PRESENTATION: 69 year old male with chronic refractory back pain presented 2/25 for posterior lumbar interbody fusion under Dr. Venetia Maxon. He experienced significant hypotension intra-operatively. PCCM to eval in ICU post-op.   STUDIES:    SIGNIFICANT EVENTS: 2/25 Lumbar two-three, Lumba three-four Posterior Lateral Arthrodesis, Lumbar four-five TLIF Interbody Fusion, Lumbar five-Sacral one Posterior lumbar interbody fusion (N/A) - L2-3 L3-4 L4-5 L5-S1 Pedicle screw fixation and posterolateral arthrodesis, decompressive laminectomy L 23, L 34, L 45, L 5 S 1 levels; decompression greater than standard PLIF procedure   HISTORY OF PRESENT ILLNESS:  69 year old male with PMH as below, which includes HTN, DI, OSA, DM, and hypotension, and panhypotituitarism. Also has history of Scoliosis and kyphoscoliosis, idiopathic, Spondylolisthesis, Spinal Stenosis, lumbar region, Radiculopathy, lumbar region L 23, L 34, L 45, L 5 S 1 levels for which he has been seen by neurosurgery. He has had increased pain refractory to medical treatments and injections. 2/25 he presented to surgical procedure under Dr. Venetia Maxon. Underwent lumbar two-three, Lumba three-four Posterior Lateral Arthrodesis, Lumbar four-five TLIF Interbody Fusion, Lumbar five-Sacral one Posterior lumbar interbody fusion (N/A) - L2-3 L3-4 L4-5 L5-S1 Pedicle screw fixation and posterolateral arthrodesis, decompressive laminectomy L 23, L 34, L 45, L 5 S 1 levels; decompression greater than standard PLIF procedure.   Intraoperatively his course was complicated by profound hypotension, which reportedly has happened to him in the past. He required vasoactive medications to sustain adequate blood pressure. Post operatively he was able  to be extubated, hemodynamically stable, and sent to ICU for monitoring. PCCM to see.  PAST MEDICAL HISTORY :   has a past medical history of Hyperlipidemia; Hypertension; Diabetes insipidus; Anxiety; Sleep apnea; Arthritis; Fibromyalgia; History of kidney stones; Frequency of urination; TMJ (temporomandibular joint disorder); Diabetes mellitus without complication; and Complication of anesthesia.  has past surgical history that includes Spine surgery; Rotator cuff repair; Posterior cervical fusion/foraminotomy (N/A, 06/07/2013); Ulnar nerve transposition (Right, 06/07/2013); and Brain surgery. Prior to Admission medications   Medication Sig Start Date End Date Taking? Authorizing Provider  acetaminophen-codeine (TYLENOL #3) 300-30 MG per tablet Take 1 tablet by mouth every 4 (four) hours as needed for pain (can take twice a day).   Yes Historical Provider, MD  ALPRAZolam Prudy Feeler) 0.5 MG tablet Take 0.5 mg by mouth at bedtime as needed for sleep.   Yes Historical Provider, MD  aspirin 81 MG tablet Take 81 mg by mouth daily.   Yes Historical Provider, MD  atorvastatin (LIPITOR) 10 MG tablet Take 10 mg by mouth daily.   Yes Historical Provider, MD  calcium carbonate (OS-CAL) 600 MG TABS tablet Take 600 mg by mouth 2 (two) times daily with a meal.   Yes Historical Provider, MD  Cholecalciferol (VITAMIN D) 2000 UNITS tablet Take 2,000 Units by mouth daily.   Yes Historical Provider, MD  cyclobenzaprine (FLEXERIL) 10 MG tablet Take 10 mg by mouth 3 (three) times daily as needed for muscle spasms.   Yes Historical Provider, MD  desmopressin (DDAVP) 0.2 MG tablet TAKE 2 TABLETS (0.4MG ) 3   TIMES DAILY 04/17/14  Yes Reather Littler, MD  DULoxetine (CYMBALTA) 60 MG capsule Take 60 mg by mouth daily.   Yes Historical Provider, MD  etodolac (LODINE) 400 MG tablet Take 400 mg by mouth  2 (two) times daily.   Yes Historical Provider, MD  Glucos-MSM-C-Mn-Ginger-Willow (GLUCOSAMINE MSM COMPLEX PO) Take 1 tablet by mouth  daily.   Yes Historical Provider, MD  HYDROcodone-acetaminophen (NORCO/VICODIN) 5-325 MG per tablet Take 1 tablet by mouth every 6 (six) hours as needed for pain.   Yes Historical Provider, MD  hydrocortisone (CORTEF) 5 MG tablet 2 tabs in the AM and 1 tab at 5 pm 05/12/14  Yes Reather LittlerAjay Kumar, MD  levothyroxine (SYNTHROID, LEVOTHROID) 150 MCG tablet Take 1 tablet (150 mcg total) by mouth daily before breakfast. 05/12/14  Yes Reather LittlerAjay Kumar, MD  lidocaine (LIDODERM) 5 % Place 1 patch onto the skin daily. Remove & Discard patch within 12 hours or as directed by MD   Yes Historical Provider, MD  losartan (COZAAR) 100 MG tablet Take 100 mg by mouth daily.   Yes Historical Provider, MD  methocarbamol (ROBAXIN) 500 MG tablet Take 500 mg by mouth daily as needed for muscle spasms.  08/18/14  Yes Historical Provider, MD  Multiple Vitamin (MULTIVITAMIN WITH MINERALS) TABS tablet Take 1 tablet by mouth daily.   Yes Historical Provider, MD  psyllium (METAMUCIL SMOOTH TEXTURE) 28 % packet Take 1 packet by mouth 2 (two) times daily.   Yes Historical Provider, MD  sildenafil (VIAGRA) 50 MG tablet Take 50 mg by mouth daily as needed for erectile dysfunction.   Yes Historical Provider, MD  Somatropin 15 MG/1.5ML SOLN 0.2 mg injection daily. 08/30/14  Yes Reather LittlerAjay Kumar, MD  tamsulosin (FLOMAX) 0.4 MG CAPS Take 0.4 mg by mouth daily after supper.    Yes Historical Provider, MD  Testosterone (AXIRON) 30 MG/ACT SOLN Place 30 mg onto the skin 4 (four) times daily. Patient taking differently: Place 30 mg onto the skin daily.  08/30/14  Yes Reather LittlerAjay Kumar, MD  traZODone (DESYREL) 100 MG tablet Take 100 mg by mouth at bedtime.   Yes Historical Provider, MD  vitamin C (ASCORBIC ACID) 500 MG tablet Take 500 mg by mouth 2 (two) times daily.   Yes Historical Provider, MD   Allergies  Allergen Reactions  . Accupril [Quinapril Hcl]   . Niacin And Related     FAMILY HISTORY:  has no family status information on file.  SOCIAL HISTORY:   reports that he has never smoked. He has never used smokeless tobacco. He reports that he drinks alcohol. He reports that he does not use illicit drugs.  REVIEW OF SYSTEMS:   Bolds are positive  Constitutional: weight loss, gain, night sweats, Fevers, chills, fatigue .  HEENT: headaches, Sore throat, sneezing, nasal congestion, post nasal drip, Difficulty swallowing, Tooth/dental problems, visual complaints visual changes, ear ache CV:  chest pain, radiates: ,Orthopnea, PND, swelling in lower extremities, dizziness, palpitations, syncope.  GI  heartburn, indigestion, abdominal pain, nausea, vomiting, diarrhea, change in bowel habits, loss of appetite, bloody stools.  Resp: cough, productive: , hemoptysis, dyspnea, chest pain, pleuritic.  Skin: rash or itching or icterus GU: dysuria, change in color of urine, urgency or frequency. flank pain, hematuria  MS: Back pain. decreased range of motion  Psych: change in mood or affect. depression or anxiety.  Neuro: difficulty with speech, weakness, numbness, ataxia    SUBJECTIVE:   VITAL SIGNS: Temp:  [97.9 F (36.6 C)-98.3 F (36.8 C)] 98.3 F (36.8 C) (02/25 1645) Pulse Rate:  [82-103] 103 (02/25 1705) Resp:  [11-19] 11 (02/25 1705) BP: (84-168)/(53-85) 101/53 mmHg (02/25 1656) SpO2:  [95 %-99 %] 95 % (02/25 1705) Arterial Line BP: (80-120)/(40-52) 94/40  mmHg (02/25 1705) Weight:  [92.08 kg (203 lb)] 92.08 kg (203 lb) (02/25 0841) HEMODYNAMICS:   VENTILATOR SETTINGS:   INTAKE / OUTPUT:  Intake/Output Summary (Last 24 hours) at 10/26/14 1720 Last data filed at 10/26/14 1645  Gross per 24 hour  Intake   3225 ml  Output   1285 ml  Net   1940 ml    PHYSICAL EXAMINATION: General:  Elderly male in NAD Neuro:  Alert, oriented, non-focal HEENT:  [place]/AT, PERRL, no JVD noted Cardiovascular:  RRR, no MRG Lungs:  Clear bilateral breath sounds Abdomen:  Soft, non-tender, non-distended Musculoskeletal:  No acute deformity or ROM  limtations Skin:  Grossly intact, surgical incision and hemovac to back.   LABS:  CBC  Recent Labs Lab 10/20/14 1442  WBC 6.7  HGB 15.3  HCT 46.5  PLT 216   Coag's No results for input(s): APTT, INR in the last 168 hours. BMET  Recent Labs Lab 10/20/14 1442  NA 141  K 3.9  CL 105  CO2 27  BUN 15  CREATININE 1.03  GLUCOSE 95   Electrolytes  Recent Labs Lab 10/20/14 1442  CALCIUM 9.3   Sepsis Markers No results for input(s): LATICACIDVEN, PROCALCITON, O2SATVEN in the last 168 hours. ABG No results for input(s): PHART, PCO2ART, PO2ART in the last 168 hours. Liver Enzymes No results for input(s): AST, ALT, ALKPHOS, BILITOT, ALBUMIN in the last 168 hours. Cardiac Enzymes No results for input(s): TROPONINI, PROBNP in the last 168 hours. Glucose No results for input(s): GLUCAP in the last 168 hours.  Imaging No results found.   ASSESSMENT / PLAN:  Intraoperative hypotension H/o HTN  - Hypotenison seems to have resolved at this point. He is off vasoactive infusions at this time and is mildly hypertensive per art line tracing. Home anti-hypertensive medication have been ordered by primary team and are pending administration. He is hemodynamically stable and maintaining airway easily at this point. Will monitor in ICU overnight given course of hospitalization thus far. Agree with current plan as outlined by Neurosurgery.   Pain management  - per primary  H/o Panhypopituitarism DM Chronic steroids  - per primary   Joneen Roach, AGACNP-BC [place] [facility]/Critical Care Pager [phone] or [phone]    Attending:  I have seen and examined the patient with nurse practitioner/resident and agree with the note above.   Interesting case, not clear why he had such a severe response intraoperatively despite receiving hydrocortisone.  Surely this is related to his pan-hypo-pit, would guess next time he may need hydrocortisone for 1-2 days prior to  surgery?  Unclear. At this point he is stable.  Will monitor overnight.   Heber Whiteland, MD [place] PCCM Pager: [phone] Cell: [phone] If no response, call [phone]

## 2014-10-26 NOTE — Progress Notes (Signed)
Awake, alert, conversant.  MAEW with good power.  Doing well.  

## 2014-10-26 NOTE — Interval H&P Note (Signed)
History and Physical Interval Note:  10/26/2014 7:23 AM  Troy Collier  has presented today for surgery, with the diagnosis of Scoliosis and kyphoscoliosis, idiopathic, Spondylolisthesis, Spinal Stenosis, lumbar region, Radiculopathy, lumbar region  The various methods of treatment have been discussed with the patient and family. After consideration of risks, benefits and other options for treatment, the patient has consented to  Procedure(s) with comments: L2-3 L3-4 L4-5 L5-S1 Posterior lumbar interbody fusion (N/A) - L2-3 L3-4 L4-5 L5-S1 Posterior lumbar interbody fusion as a surgical intervention .  The patient's history has been reviewed, patient examined, no change in status, stable for surgery.  I have reviewed the patient's chart and labs.  Questions were answered to the patient's satisfaction.     Valari Taylor D

## 2014-10-26 NOTE — Anesthesia Postprocedure Evaluation (Signed)
  Anesthesia Post-op Note  Patient: Troy Collier  Procedure(s) Performed: Procedure(s) with comments: Lumbar two-three, Lumba three-four Posterior Lateral Arthrodesis, Lumbar four-five Thoracolumbar Interbody Fusion, Lumbar five-Sacral one Posterior lumbar interbody fusion (N/A) - L2-3 L3-4 L4-5 L5-S1 Posterior lumbar interbody fusion  Patient Location: PACU  Anesthesia Type:General  Level of Consciousness: awake, oriented, sedated and patient cooperative  Airway and Oxygen Therapy: Patient Spontanous Breathing  Post-op Pain: mild  Post-op Assessment: Post-op Vital signs reviewed, Patient's Cardiovascular Status Stable, Respiratory Function Stable, Patent Airway, No signs of Nausea or vomiting and Pain level controlled  Post-op Vital Signs: stable  Last Vitals:  Filed Vitals:   10/26/14 1726  BP: 114/62  Pulse: 99  Temp:   Resp: 14    Complications: No apparent anesthesia complications

## 2014-10-26 NOTE — Anesthesia Preprocedure Evaluation (Addendum)
Anesthesia Evaluation  Patient identified by MRN, date of birth, ID band Patient awake    Reviewed: Allergy & Precautions, NPO status , Patient's Chart, lab work & pertinent test results  History of Anesthesia Complications (+) history of anesthetic complications  Airway Mallampati: III  TM Distance: <3 FB Neck ROM: Limited    Dental  (+) Missing, Dental Advisory Given,    Pulmonary  breath sounds clear to auscultation        Cardiovascular hypertension, Rhythm:Regular Rate:Normal     Neuro/Psych    GI/Hepatic   Endo/Other  diabetesHx  of Pan hypopituitarism, on replacement meds.  Renal/GU      Musculoskeletal  (+) Arthritis -,   Abdominal   Peds  Hematology   Anesthesia Other Findings   Reproductive/Obstetrics                            Anesthesia Physical Anesthesia Plan  ASA: II  Anesthesia Plan: General   Post-op Pain Management:    Induction: Intravenous  Airway Management Planned: Video Laryngoscope Planned and Oral ETT  Additional Equipment: Arterial line  Intra-op Plan:   Post-operative Plan: Possible Post-op intubation/ventilation and Extubation in OR  Informed Consent: I have reviewed the patients History and Physical, chart, labs and discussed the procedure including the risks, benefits and alternatives for the proposed anesthesia with the patient or authorized representative who has indicated his/her understanding and acceptance.   Dental advisory given  Plan Discussed with: CRNA and Surgeon  Anesthesia Plan Comments:         Anesthesia Quick Evaluation

## 2014-10-26 NOTE — Brief Op Note (Signed)
10/26/2014  4:31 PM  PATIENT:  Troy Collier  69 y.o. male  PRE-OPERATIVE DIAGNOSIS:  Scoliosis and kyphoscoliosis, idiopathic, Spondylolisthesis, Spinal Stenosis, lumbar region, Radiculopathy, lumbar region L 23, L 34, L 45, L 5 S 1 levels  POST-OPERATIVE DIAGNOSIS:   Scoliosis and kyphoscoliosis, idiopathic, Spondylolisthesis, Spinal Stenosis, lumbar region, Radiculopathy, lumbar region L 23, L 34, L 45, L 5 S 1 levels  PROCEDURE:  Procedure(s) with comments: Lumbar two-three, Lumba three-four Posterior Lateral Arthrodesis, Lumbar four-five TLIF Interbody Fusion, Lumbar five-Sacral one Posterior lumbar interbody fusion (N/A) - L2-3 L3-4 L4-5 L5-S1 Pedicle screw fixation and posterolateral arthrodesis, decompressive laminectomy L 23, L 34, L 45, L 5 S 1 levels; decompression greater than standard PLIF procedure  SURGEON:  Surgeon(s) and Role:    * Militza Devery, MD - Primary  PHYSICIAN ASSISTANT:   ASSISTANTS: Poteat, RN   ANESTHESIA:   general  EBL:  Total I/O In: 3225 [I.V.:2350; Blood:375; IV Piggyback:500] Out: 1185 [Urine:685; Blood:500]  BLOOD ADMINISTERED:375 CC CELLSAVER  DRAINS: (Medium) Hemovact drain(s) in the epidural space with  Suction Open   LOCAL MEDICATIONS USED:  MARCAINE     SPECIMEN:  No Specimen  DISPOSITION OF SPECIMEN:  N/A  COUNTS:  YES  TOURNIQUET:  * No tourniquets in log *  DICTATION: Patient is 69-year-old man with lumbar scoliosis and spondylolisthesis L 4/5 with severe spinal stenosis and a long history of severe back and bilateral leg pain, left greater than right. It was elected to take him to surgery for decompression and fusion from L2/3 through L5/S1 levels.   Procedure: Patient was placed in a prone position on the Jackson table after smooth and uncomplicated induction of general endotracheal anesthesia. His low back was prepped and draped in usual sterile fashion with DuraPrep. Area of incision was infiltrated with local lidocaine. Incision  was made to the lumbodorsal fascia was incised and exposure was performed of the L2-S1 spinous processes laminae facet joint and transverse processes. Intraoperative x-ray was obtained which confirmed correct orientation with marker probes at L2 through sacral levels. A total laminectomy of L2 through L5 levels was performed with disarticulation of the facet joints and thorough decompression was performed of both L2, L3, L4, L5, S1   S1 nerve roots along with the common dural tube. Decompression was greater than would be typical for PLIF. There was an old hematoma within the ligamentum flavum at L 45 on the right.  There was densely adherent scar tissue along the thecal sac, particularly at the L 45 level on the right.  I was unable to mobilize the right L 4 nerve root at this level, and consequently elected to perform a TLIF procedure, rather than PLIF at this level.  A  thorough discectomy was then performed on the left with preparation of the endplates for grafting a trial spacer was placed this level (L 45) and  After 10 cc of autograft was packed in the interspace, a 10 mm TLIF cage was inserted and countersunk appropriately. .  After trial sizing and utilization of sequential shavers, interspaces were packed with BMP, autograft and PEEK cages with NexOss Bone graft extender.  Bone autograft was packed within the interspace bilaterally along with large BMP kit and NexOss bone graft extender. Bilateral median 9 x 23 x 8 degree peek cages were packed with autograft and  extender and was inserted the interspace and countersunk appropriately at the L 5/S1 level. 10 cc graft material was packed medial to the second cage. There   was good correction of scoliotic curvature. I elected not to place interbody cages at the L 23 and L 34 levels. The posterolateral region was extensively decorticated and pedicle probes were placed at L2 through S1 bilaterally. Intraoperative fluoroscopy confirmed correct orientationin the AP  and lateral plane. 45 x 6.5 mm pedicle screws were placed at S1 bilaterally and 45 x 6.5 mm screws placed at L 3, L 5  bilaterally and 6.5 x 5m screws were placed at L 2 and L 4 bilaterally. Final x-rays demonstrated well-positioned interbody grafts and pedicle screw fixation. A 110 mm lordotic rod was placed on the right and a 110 mm rod was placed on the left locked down in situ and the posterolateral region was packed with the remaining autograft and bone graft extender on the right (30 cc) and  on the left (30 cc). The wound was irrigated. Long-acting Marcaine was injected in the subcutaneous tissues.  A Medium Hemovac drain was placed. Fascia was closed with 1 Vicryl sutures skin edges were reapproximated 2 and 3-0 Vicryl sutures. The wound was dressed with Dermabond and an occlusive dressing.  the patient was extubated in the operating room and taken to recovery in stable satisfactory condition he tolerated traction well counts were correct at the end of the case.   PLAN OF CARE: Admit to inpatient   PATIENT DISPOSITION:  PACU - hemodynamically stable.   Delay start of Pharmacological VTE agent (>24hrs) due to surgical blood loss or risk of bleeding: yes

## 2014-10-26 NOTE — H&P (Signed)
Patient ID:   [id number] Patient: Troy Collier  Date of Birth: 11/19/1945 Visit Type: Office Visit   Date: 10/18/2014 12:30 PM Provider: Danae Orleans. Venetia Maxon MD   This 69 year old male presents for back pain.  History of Present Illness: 1.  back pain  Patient comes in to review his imaging and for preoperative teaching and brace fitting.      Medical/Surgical/Interim History Reviewed, no change.  Last detailed document date:08/15/2013.   PAST MEDICAL HISTORY, SURGICAL HISTORY, FAMILY HISTORY, SOCIAL HISTORY AND REVIEW OF SYSTEMS I have reviewed the patient's past medical, surgical, family and social history as well as the comprehensive review of systems as included on the [facility] history form dated, which I have signed.  Family History: Reviewed, no changes.  Last detailed document: 08/15/2013.   Social History: Tobacco use reviewed. Reviewed, no changes. Last detailed document date: 08/15/2013.      MEDICATIONS(added, continued or stopped this visit): Started Medication Directions Instruction Stopped   aspirin 81 mg chewable tablet chew 1 tablet by oral route  every day     atorvastatin 10 mg tablet take 1 tablet by oral route  every day     Axiron 30 mg/actuation (1.5 mL) transderm solution in metered pump apply 1 pump by topical route  every day in the morning to each underarm for a total dose of 60 mg     Cortef 5 mg tablet take 1 tablet by oral route  every day with food     cyclobenzaprine 10 mg tablet take 1 tablet by oral route  every day     Cymbalta 60 mg capsule,delayed release take 1 capsule by oral route  every day     DDAVP 0.2 mg tablet take 1 tablet by oral route 3 times every day     etodolac 400 mg tablet take 1 tablet by oral route 2 times every day with food     Flomax 0.4 mg capsule take 1 capsule by oral route  every day 1/2 hour following the same meal each day     hydrocodone 5 mg-acetaminophen 325 mg tablet take 1  tablet by oral route  every day as needed for pain     Lidoderm 5 % (700 mg/patch) adhesive patch apply 1 patch by transdermal route  every day (May wear up to 12hours.)     losartan 100 mg tablet take 1 tablet by oral route  every day     Metamucil 3.4 gram/7 gram oral powder Use as directed     multivitamin tablet Take 1 tablet daily     Norditropin FlexPro 5 mg/1.5 mL (3.3 mg/mL) subcutaneous pen injector inject (0.1MG /KG)  by subcutaneous route  every day up to a maximum of 8 mg     Tirosint 150 mcg capsule take 1 capsule by oral route  every day     trazodone 100 mg tablet take 1 tablet by oral route  every bedtime     Tylenol-Codeine #3 300 mg-30 mg tablet take 1 tablet by oral route  every 8 hours as needed     Viagra 50 mg tablet take 1 tablet by oral route  every day as needed approximately 1 hour before sexual activity     Vitamin B-6 100 mg tablet Take 1 or 2 tablets daily     Vitamin C 500 mg tablet Take 1 or 2 tablets daily     vitamin d-3 1000 ORAL TABLET Take 2 tablets daily  Xanax 0.5 mg tablet take 1 tablet by oral route 3 times every day       ALLERGIES: Ingredient Reaction Medication Name Comment  INOSITOL NIACINATE  NIACIN   QUINAPRIL HCL  ACCUPRIL   NIACIN  NIACIN    Reviewed, no changes.    Vitals Date Temp F BP Pulse Ht In Wt Lb BMI BSA Pain Score  10/18/2014  158/77 89 67.5 207 31.94  6/10      IMPRESSION Patient is ready for surgery.  His questions were answered.  It was reviewed and risks and benefits were discussed in detail.  He wishes to proceed.  Completed Orders (this encounter) Order Details Reason Side Interpretation Result Initial Treatment Date Region  Lifestyle education regarding diet Encouraged to eat a well balanced diet and follow up with primary care physician.        Hypertension education Follow up with primary care physician.         Assessment/Plan # Detail Type Description   1. Assessment Low back pain, unspecified back pain  laterality, with sciatica presence unspecified (M54.5).       2. Assessment Spondylolisthesis, lumbosacral region (M43.17).       3. Assessment Radiculopathy, lumbar region (M54.16).       4. Assessment Disc displacement, lumbar (M51.26).       5. Assessment Scoliosis (and kyphoscoliosis), idiopathic (M41.20).       6. Assessment Body mass index (BMI) 31.0-31.9, adult (Z68.31).   Plan Orders Today's instructions / counseling include(s) Lifestyle education regarding diet.       7. Assessment Essential (primary) hypertension (I10).         Pain Assessment/Treatment Pain Scale: 6/10. Method: Numeric Pain Intensity Scale. Location: back. Onset: 07/28/2011. Duration: varies. Quality: discomforting. Pain Assessment/Treatment follow-up plan of care: Patient is taking medications as prescribed..  Fall Risk Plan The patient has not fallen in the last year.  Plan is for surgery on 10/26/14.  Orders: Instruction(s)/Education: Assessment Instruction  I10 Hypertension education  Z68.31 Lifestyle education regarding diet             Provider:  Danae Orleans. Venetia Maxon MD  10/24/2014 05:35 PM Dictation edited by: Danae Orleans. Venetia Maxon    CC Providers: Janece Canterbury [facility] [address] [place],  [place]  [address]-   Pollyann Savoy [facility] [address] [place], [place] [address]-              Electronically signed by Danae Orleans Venetia Maxon MD on 10/24/2014 05:35 PM  Patient ID:   [id number] Patient: Troy Collier  Date of Birth: [birth year] Visit Type: Office Visit   Date: 10/06/2014 09:45 AM Provider: Danae Orleans. Venetia Maxon MD   This 69 year old male presents for back pain.  History of Present Illness: 1.  back pain  08/10/14 and 09/07/14.  Left L2-3 TF ESI by Dr. Ollen Bowl   .  Each gave only two weeks relief  Patient reports increased pain.  Overall, noting cramping in both feet with persistent left groin and thigh burning  pain.  Unfortunately the patient did not get sustained relief with injections.  He had L3 blocks on the left and he says these gave him about 2 weeks of relief per injection.  He is also engaged in physical therapy and has not has sustained relief with that as well.  He says of both of his legs hurt.  I reviewed his imaging with him.  He has scoliosis L2  to the sacrum with anterolisthesis of L4-L5 and L5 on S1 with severe foraminal stenosis and severe stenosis at these levels with milder degenerative changes at L3 L4 and with severe degeneration at L2-3.  The L1-2 level has mild degenerative changes.  In light of the patient's persistent significant pain without relief with injections and physical therapy, I have recommended proceeding with surgery.      Medical/Surgical/Interim History Reviewed, no change.  Last detailed document date:08/15/2013.   PAST MEDICAL HISTORY, SURGICAL HISTORY, FAMILY HISTORY, SOCIAL HISTORY AND REVIEW OF SYSTEMS I have reviewed the patient's past medical, surgical, family and social history as well as the comprehensive review of systems as included on the [facility] history form dated, which I have signed.  Family History: Reviewed, no changes.  Last detailed document: 08/15/2013.   Social History: Tobacco use reviewed. Reviewed, no changes. Last detailed document date: 08/15/2013.      MEDICATIONS(added, continued or stopped this visit): Started Medication Directions Instruction Stopped   aspirin 81 mg chewable tablet chew 1 tablet by oral route  every day     atorvastatin 10 mg tablet take 1 tablet by oral route  every day     Axiron 30 mg/actuation (1.5 mL) transderm solution in metered pump apply 1 pump by topical route  every day in the morning to each underarm for a total dose of 60 mg     Cortef 5 mg tablet take 1 tablet by oral route  every day with food     cyclobenzaprine 10 mg tablet take 1 tablet by oral route   every day     Cymbalta 60 mg capsule,delayed release take 1 capsule by oral route  every day     DDAVP 0.2 mg tablet take 1 tablet by oral route 3 times every day     etodolac 400 mg tablet take 1 tablet by oral route 2 times every day with food     Flomax 0.4 mg capsule take 1 capsule by oral route  every day 1/2 hour following the same meal each day     hydrocodone 5 mg-acetaminophen 325 mg tablet take 1 tablet by oral route  every day as needed for pain     Lidoderm 5 % (700 mg/patch) adhesive patch apply 1 patch by transdermal route  every day (May wear up to 12hours.)     losartan 100 mg tablet take 1 tablet by oral route  every day     Metamucil 3.4 gram/7 gram oral powder Use as directed     multivitamin tablet Take 1 tablet daily     Norditropin FlexPro 5 mg/1.5 mL (3.3 mg/mL) subcutaneous pen injector inject (0.1MG /KG)  by subcutaneous route  every day up to a maximum of 8 mg     Tirosint 150 mcg capsule take 1 capsule by oral route  every day     trazodone 100 mg tablet take 1 tablet by oral route  every bedtime     Tylenol-Codeine #3 300 mg-30 mg tablet take 1 tablet by oral route  every 8 hours as needed     Viagra 50 mg tablet take 1 tablet by oral route  every day as needed approximately 1 hour before sexual activity     Vitamin B-6 100 mg tablet Take 1 or 2 tablets daily     Vitamin C 500 mg tablet Take 1 or 2 tablets daily     vitamin d-3 1000 ORAL TABLET Take 2 tablets  daily     Xanax 0.5 mg tablet take 1 tablet by oral route 3 times every day       ALLERGIES: Ingredient Reaction Medication Name Comment  INOSITOL NIACINATE  NIACIN   QUINAPRIL HCL  ACCUPRIL   NIACIN  NIACIN    Reviewed, no changes.    Vitals Date Temp F BP Pulse Ht In Wt Lb BMI BSA Pain Score  10/06/2014  170/91 88 67.5 205 31.63  7/10      IMPRESSION Lumbar scoliosis, spondylolisthesis, stenosis, radiculopathy, disc herniations L2-3, L3 L4, L4 L5, L5-S1 levels.  I recommended decompression and  fusion at these affected levels.  Completed Orders (this encounter) Order Details Reason Side Interpretation Result Initial Treatment Date Region  Lifestyle education regarding diet Encouraged to eat a well balanced diet and follow up with primary care physician.        Hypertension education Follow up with primary care physician.         Assessment/Plan # Detail Type Description   1. Assessment Scoliosis (and kyphoscoliosis), idiopathic (M41.20).       2. Assessment Spondylolisthesis, lumbar region (M43.16).       3. Assessment Spondylolisthesis, lumbosacral region (M43.17).       4. Assessment Spinal stenosis of lumbar region (M48.06).       5. Assessment Radiculopathy, lumbar region (M54.16).       6. Assessment Low back pain, unspecified back pain laterality, with sciatica presence unspecified (M54.5).       7. Assessment Body mass index (BMI) 31.0-31.9, adult (Z68.31).   Plan Orders Today's instructions / counseling include(s) Lifestyle education regarding diet.       8. Assessment Essential (primary) hypertension (I10).         Pain Assessment/Treatment Pain Scale: 7/10. Method: Numeric Pain Intensity Scale. Location: back. Onset: 07/28/2011. Duration: varies. Quality: discomforting. Pain Assessment/Treatment follow-up plan of care: Patient is taking medications as prescribed..  Fall Risk Plan The patient has not fallen in the last year.  Risks and benefits were discussed in detail with the patient.  He wishes to proceed.  I advised him the importance of weight control.  He showed her models and went over the specifics of the surgical procedure.  He was fitted for an LSO brace.  Orders: Diagnostic Procedures: Assessment Procedure  M41.20 PLIF - L2-L3 - L3-L4 - L4-L5 - L5-S1  Instruction(s)/Education: Assessment Instruction  I10 Hypertension education  Z68.31 Lifestyle education regarding diet             Provider:  Danae Orleans. Venetia Maxon MD  10/06/2014 01:57  PM Dictation edited by: Danae Orleans. Venetia Maxon    CC Providers: Janece Canterbury [facility] [address] [place],  [place]  [address]-   Pollyann Savoy [facility] [address] [place], [place] [address]-              Electronically signed by Danae Orleans Venetia Maxon MD on 10/06/2014 01:57 PM  > [address] [place] [address] [place], [place] [address] Phone: [phone]   Patient ID:   [id number] Patient: Troy Collier  Date of Birth: 22-Sep-1945 Visit Type: Office Visit   Date: 07/04/2014 12:30 PM Provider: Danae Orleans. Venetia Maxon MD   This 69 year old male presents for Follow Up of Back pain.  History of Present Illness: 1.  Follow Up of Back pain  Patient states that he is doing very well from the standpoint of his neck but is having low back pain  with burning into both his thighs left greater than right.  He is taking Tylenol 3 3 times daily.  He uses Norco to help him sleep on a rare basis.  He says he has a hard time walking because of the burning in his legs.  Patient had 4 view lumbar radiographs in the office today which show retrolisthesis of L2 on L3 which may be causing bilateral L2 radiculopathies.  He also has anterolisthesis of L5 on S1 and external convex scoliosis of the lumbar spine on AP projection.  The patient strength is full on, rotational testing bilaterally.  He says that the problems are the same as those that he had in 2012 but are now worse.  I have recommended we obtain a lumbar MRI to assess for structural pathology.  I will make further recommendations after that study has been performed.      Medical/Surgical/Interim History Reviewed, no change.  Last detailed document date:08/15/2013.   Family History: Reviewed, no changes.  Last detailed document: 08/15/2013.   Social History: Tobacco use reviewed. Reviewed, no changes. Last detailed document date: 08/15/2013.      MEDICATIONS(added, continued  or stopped this visit):   Started Medication Directions Instruction Stopped   aspirin 81 mg chewable tablet chew 1 tablet by oral route  every day     atorvastatin 10 mg tablet take 1 tablet by oral route  every day     ATORVASTATIN CALCIUM take 1 tablet by oral route  every day  07/04/2014   Axiron 30 mg/actuation (1.5 mL) transderm solution in metered pump apply 1 pump by topical route  every day in the morning to each underarm for a total dose of 60 mg     Cortef 5 mg tablet take 1 tablet by oral route  every day with food     cyclobenzaprine 10 mg tablet take 1 tablet by oral route  every day     Cymbalta take 1 capsule by oral route  every day  07/04/2014   Cymbalta 60 mg capsule,delayed release take 1 capsule by oral route  every day     DDAVP 0.2 mg tablet take 1 tablet by oral route 3 times every day     etodolac 400 mg tablet take 1 tablet by oral route 2 times every day with food     Flomax 0.4 mg capsule take 1 capsule by oral route  every day 1/2 hour following the same meal each day     hydrocodone 5 mg-acetaminophen 325 mg tablet take 1 tablet by oral route  every day as needed for pain     Lidoderm 5 % (700 mg/patch) adhesive patch apply 1 patch by transdermal route  every day (May wear up to 12hours.)     losartan 100 mg tablet take 1 tablet by oral route  every day     Metamucil 3.4 gram/7 gram oral powder Use as directed     multivitamin tablet Take 1 tablet daily     Norditropin FlexPro 5 mg/1.5 mL (3.3 mg/mL) subcutaneous pen injector inject (0.1MG /KG)  by subcutaneous route  every day up to a maximum of 8 mg     Synthroid take 1 tablet by oral route  every day  07/04/2014   Tirosint 150 mcg capsule take 1 capsule by oral route  every day     trazodone 100 mg tablet take 1 tablet by oral route  every bedtime     Tylenol-Codeine #3 300 mg-30 mg tablet  take 1 tablet by oral route  every 8 hours as needed    06/16/2013 Tylenol-Codeine #3 300 mg-30 mg tablet take 1-2 tablets  by oral route  every 4 hours as needed  07/04/2014   Viagra 50 mg tablet take 1 tablet by oral route  every day as needed approximately 1 hour before sexual activity     Vitamin B-6 100 mg tablet Take 1 or 2 tablets daily     Vitamin C 500 mg tablet Take 1 or 2 tablets daily     vitamin d-3 1000 ORAL TABLET Take 2 tablets daily     Xanax 0.5 mg tablet take 1 tablet by oral route 3 times every day      ALLERGIES:  Ingredient Reaction Medication Name Comment  INOSITOL NIACINATE  NIACIN   QUINAPRIL HCL  ACCUPRIL   NIACIN  NIACIN      Vitals Date Temp F BP Pulse Ht In Wt Lb BMI BSA Pain Score  07/04/2014  162/75 86 67.5 199.6 30.8  7/10        IMPRESSION Patient has bilateral thigh pain consistent with L2 radiculopathies.  He has retrolisthesis of L2 and L3.  He has anterolisthesis of L5 on S1 and external convex scoliosis of the lumbar spine and AP projection.  Completed Orders (this encounter) Order Details Reason Side Interpretation Result Initial Treatment Date Region  Dietary management education, guidance, and counseling Encouraged to eat a well balanced diet and follow up with primary care physician.        Hypertension education Follow up with primary care physician for elevated blood pressure.        Lumbar Spine- AP/Lat/Flex/Ex      07/04/2014 All Levels to All Levels   Assessment/Plan # Detail Type Description   1. Assessment Spondylolisthesis, lumbosacral region (M43.17).       2. Assessment Spondylolisthesis, lumbar region (M43.16).       3. Assessment Body mass index (BMI) 30.0-30.9, adult (Z68.30).   Plan Orders Today's instructions / counseling include(s) Dietary management education, guidance, and counseling.       4. Assessment Essential (primary) hypertension (I10).       5. Assessment Lumbar radiculopathy, acute (M54.16).         Pain Assessment/Treatment Pain Scale: 7/10. Method: Numeric Pain Intensity Scale. Location: back. Onset:  07/28/2011. Duration: varies. Quality: discomforting. Pain Assessment/Treatment follow-up plan of care: Patient taking medications as prescribed..  Fall Risk Plan The patient has not fallen in the last year.  MRI of lumbar spine and return visit.  Orders: Diagnostic Procedures: Assessment Procedure  M43.16 MRI Spine/lumb W/o Contrast  M43.17 Return to Clinic after study is performed  M54.16 Lumbar Spine- AP/Lat/Flex/Ex  Instruction(s)/Education: Assessment Instruction  I10 Hypertension education  Z68.30 Dietary management education, guidance, and counseling             Provider:  Danae OrleansJoseph D. Venetia MaxonStern MD  07/16/2014 04:21 PM Dictation edited by: Danae OrleansJoseph D. Venetia MaxonStern    CC Providers: Janece CanterburyAaron  [facility] [address] [place],  [place]  [phone]-   Pollyann SavoyShaili Deveshwar [facility] [address] [place], [place] [id number]- ----------------------------------------------------------------------------------------------------------------------------------------------------------------------         Electronically signed by Danae OrleansJoseph D. Venetia MaxonStern MD on 07/16/2014 04:21 PM

## 2014-10-26 NOTE — Transfer of Care (Signed)
Immediate Anesthesia Transfer of Care Note  Patient: Troy Collier  Procedure(s) Performed: Procedure(s) with comments: Lumbar two-three, Lumba three-four Posterior Lateral Arthrodesis, Lumbar four-five Thoracolumbar Interbody Fusion, Lumbar five-Sacral one Posterior lumbar interbody fusion (N/A) - L2-3 L3-4 L4-5 L5-S1 Posterior lumbar interbody fusion  Patient Location: PACU  Anesthesia Type:General  Level of Consciousness: sedated  Airway & Oxygen Therapy: Patient Spontanous Breathing and Patient connected to face mask oxygen  Post-op Assessment: Report given to RN, Post -op Vital signs reviewed and stable and Patient moving all extremities  Post vital signs: Reviewed and stable  Last Vitals:  Filed Vitals:   10/26/14 0841  BP: 168/85  Pulse: 82  Temp: 36.6 C  Resp: 18    Complications: No apparent anesthesia complications

## 2014-10-26 NOTE — Anesthesia Procedure Notes (Signed)
Procedure Name: Intubation Date/Time: 10/26/2014 10:55 AM Performed by: Orvilla FusATO, Demitra Danley A Pre-anesthesia Checklist: Patient identified, Timeout performed, Emergency Drugs available, Suction available and Patient being monitored Patient Re-evaluated:Patient Re-evaluated prior to inductionOxygen Delivery Method: Circle system utilized Preoxygenation: Pre-oxygenation with 100% oxygen Intubation Type: IV induction Ventilation: Mask ventilation without difficulty and Oral airway inserted - appropriate to patient size Grade View: Grade I Tube type: Oral Tube size: 7.5 mm Number of attempts: 1 Airway Equipment and Method: Video-laryngoscopy and Rigid stylet Placement Confirmation: ETT inserted through vocal cords under direct vision,  positive ETCO2 and breath sounds checked- equal and bilateral Secured at: 23 cm Tube secured with: Tape Dental Injury: Teeth and Oropharynx as per pre-operative assessment

## 2014-10-27 DIAGNOSIS — E230 Hypopituitarism: Secondary | ICD-10-CM

## 2014-10-27 LAB — POCT I-STAT 7, (LYTES, BLD GAS, ICA,H+H)
Acid-base deficit: 4 mmol/L — ABNORMAL HIGH (ref 0.0–2.0)
Bicarbonate: 22.6 mEq/L (ref 20.0–24.0)
Calcium, Ion: 1.08 mmol/L — ABNORMAL LOW (ref 1.13–1.30)
HCT: 32 % — ABNORMAL LOW (ref 39.0–52.0)
HEMOGLOBIN: 10.9 g/dL — AB (ref 13.0–17.0)
O2 Saturation: 100 %
PH ART: 7.314 — AB (ref 7.350–7.450)
PO2 ART: 364 mmHg — AB (ref 80.0–100.0)
Patient temperature: 36.4
Potassium: 3 mmol/L — ABNORMAL LOW (ref 3.5–5.1)
SODIUM: 138 mmol/L (ref 135–145)
TCO2: 24 mmol/L (ref 0–100)
pCO2 arterial: 44.2 mmHg (ref 35.0–45.0)

## 2014-10-27 LAB — GLUCOSE, CAPILLARY: Glucose-Capillary: 155 mg/dL — ABNORMAL HIGH (ref 70–99)

## 2014-10-27 MED ORDER — PANTOPRAZOLE SODIUM 40 MG PO TBEC
40.0000 mg | DELAYED_RELEASE_TABLET | Freq: Every day | ORAL | Status: DC
  Administered 2014-10-27 – 2014-10-29 (×3): 40 mg via ORAL
  Filled 2014-10-27 (×3): qty 1

## 2014-10-27 NOTE — Progress Notes (Signed)
PULMONARY / CRITICAL CARE MEDICINE   Name: Marjean DonnaJohn Forlenza MRN: [id number] DOB: 11/02/1945    ADMISSION DATE:  10/26/2014 CONSULTATION DATE:  10/26/2014  REFERRING MD :  Venetia MaxonStern  CHIEF COMPLAINT:  Perioperative hypotension  INITIAL PRESENTATION: 69 year old male with chronic refractory back pain presented 2/25 for posterior lumbar interbody fusion under Dr. Venetia MaxonStern. He experienced significant hypotension intra-operatively. PCCM to eval in ICU post-op.   STUDIES:    SIGNIFICANT EVENTS: 2/25 Lumbar two-three, Lumba three-four Posterior Lateral Arthrodesis, Lumbar four-five TLIF Interbody Fusion, Lumbar five-Sacral one Posterior lumbar interbody fusion (N/A) - L2-3 L3-4 L4-5 L5-S1 Pedicle screw fixation and posterolateral arthrodesis, decompressive laminectomy L 23, L 34, L 45, L 5 S 1 levels; decompression greater than standard PLIF procedure   SUBJECTIVE: feels well, has no complaints other than pain, BP stable.  He reports no episodes of dizziness, lightheadedness, or chest pain at home.  VITAL SIGNS: Temp:  [97.5 F (36.4 C)-98.5 F (36.9 C)] 97.9 F (36.6 C) (02/26 0400) Pulse Rate:  [82-112] 96 (02/26 0600) Resp:  [7-24] 9 (02/26 0600) BP: (84-168)/(45-85) 129/68 mmHg (02/26 0600) SpO2:  [93 %-99 %] 98 % (02/26 0600) Arterial Line BP: (80-169)/(40-98) 157/73 mmHg (02/26 0600) Weight:  [92.08 kg (203 lb)-95.2 kg (209 lb 14.1 oz)] 95.2 kg (209 lb 14.1 oz) (02/25 1900) HEMODYNAMICS:   VENTILATOR SETTINGS:   INTAKE / OUTPUT:  Intake/Output Summary (Last 24 hours) at 10/27/14 0701 Last data filed at 10/27/14 0600  Gross per 24 hour  Intake 4388.75 ml  Output   6100 ml  Net -1711.25 ml    PHYSICAL EXAMINATION: General:  No acute distress HEENT: NCAT EOMi PULM: CTA B CV: RRR, no mgr Ab: BS+, soft, nontender Ext: warm, no edema Neuro: A&Ox4  LABS:  CBC  Recent Labs Lab 10/20/14 1442 10/26/14 1424  WBC 6.7  --   HGB 15.3 10.9*  HCT 46.5 32.0*  PLT 216  --     Coag's No results for input(s): APTT, INR in the last 168 hours. BMET  Recent Labs Lab 10/20/14 1442 10/26/14 1424  NA 141 138  K 3.9 3.0*  CL 105  --   CO2 27  --   BUN 15  --   CREATININE 1.03  --   GLUCOSE 95  --    Electrolytes  Recent Labs Lab 10/20/14 1442  CALCIUM 9.3   Sepsis Markers No results for input(s): LATICACIDVEN, PROCALCITON, O2SATVEN in the last 168 hours. ABG  Recent Labs Lab 10/26/14 1424  PHART 7.314*  PCO2ART 44.2  PO2ART 364.0*   Liver Enzymes No results for input(s): AST, ALT, ALKPHOS, BILITOT, ALBUMIN in the last 168 hours. Cardiac Enzymes No results for input(s): TROPONINI, PROBNP in the last 168 hours. Glucose  Recent Labs Lab 10/26/14 2012 10/26/14 2359  GLUCAP 126* 145*    Imaging   ASSESSMENT / PLAN:  Intraoperative hypotension: this problem resolved immediately post operatively.  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  However, would like to check an echocardiogram for completeness sake.  Will look for evidence of valvular disease, HCOM, or pulmonary hypertension.  Will also ask his endocrinologist to give guidance for stress dose for future surgeries. -check echo -d/c a-line -f/u with endocrine  H/o HTN  - OK to resume home losartan  Pain management  - per primary  H/o Panhypopituitarism DM Chronic steroids  - per primary   Heber CarolinaBrent McQuaid, MD  PCCM Pager: [phone] Cell: (  [phone] If no response, call [phone]

## 2014-10-27 NOTE — Progress Notes (Signed)
Subjective: Patient reports "I feel ok. My leg pain is quite a bit less...just some back pain"  Objective: Vital signs in last 24 hours: Temp:  [97.5 F (36.4 C)-98.5 F (36.9 C)] 97.9 F (36.6 C) (02/26 0400) Pulse Rate:  [82-112] 96 (02/26 0600) Resp:  [7-24] 9 (02/26 0600) BP: (84-168)/(45-85) 129/68 mmHg (02/26 0600) SpO2:  [93 %-99 %] 98 % (02/26 0600) Arterial Line BP: (80-169)/(40-98) 157/73 mmHg (02/26 0600) Weight:  [92.08 kg (203 lb)-95.2 kg (209 lb 14.1 oz)] 95.2 kg (209 lb 14.1 oz) (02/25 1900)  Intake/Output from previous day: 02/25 0701 - 02/26 0700 In: 4463.8 [P.O.:60; I.V.:3328.8; Blood:375; IV Piggyback:550] Out: 6100 [Urine:4775; Drains:825; Blood:500] Intake/Output this shift:    Alert, conversant. Good strength BLE. Drsg intact, incision without erythema, swelling, or drainage. Pt aware of echo planned to clear heart issues d/t intra-op hypotension.   Lab Results:  Recent Labs  10/26/14 1424  HGB 10.9*  HCT 32.0*   BMET  Recent Labs  10/26/14 1424  NA 138  K 3.0*    Studies/Results: Dg Lumbar Spine 2-3 Views  10/26/2014   CLINICAL DATA:  L2-3, L3-4, L4-5, and 5-S1 posterior fixation.  EXAM: DG C-ARM 61-120 MIN; LUMBAR SPINE - 2-3 VIEW  FLUOROSCOPY TIME:  If the device does not provide the exposure index:  Fluoroscopy Time (in minutes and seconds):  0 minutes 32 seconds  Number of Acquired Images:  4  COMPARISON:  10/26/2014 at 1145 hours.  FINDINGS: 1523 hours. Three AP and 1 lateral view. The AP images demonstrate soft tissue expanders with trans pedicle screws throughout the lumbar spine. Lateral view images trans pedicle screw fixation at L2 through S1. Grade 1 L5-S1 anterolisthesis. No acute hardware complication.  IMPRESSION: Intraoperative imaging of lumbosacral spine fixation.   Electronically Signed   By: Jeronimo GreavesKyle  Talbot M.D.   On: 10/26/2014 16:10   Dg Lumbar Spine 1 View  10/26/2014   CLINICAL DATA:  L2 through S1 posterior lumbar interbody  fusion.  EXAM: LUMBAR SPINE - 1 VIEW  COMPARISON:  07/22/2014.  FINDINGS: Intraoperative localization lateral film shows instrumentation posteriorly from L2 through L5. The lowest probe is dorsal to S1, probably residing in the L5-S1 interspinous space. Numbering used on prior MRI was preserved. The most superior probe is at L2.  IMPRESSION: Intraoperative localization from L2 through S1.   Electronically Signed   By: Andreas NewportGeoffrey  Lamke M.D.   On: 10/26/2014 15:39   Dg C-arm 1-60 Min  10/26/2014   CLINICAL DATA:  L2-3, L3-4, L4-5, and 5-S1 posterior fixation.  EXAM: DG C-ARM 61-120 MIN; LUMBAR SPINE - 2-3 VIEW  FLUOROSCOPY TIME:  If the device does not provide the exposure index:  Fluoroscopy Time (in minutes and seconds):  0 minutes 32 seconds  Number of Acquired Images:  4  COMPARISON:  10/26/2014 at 1145 hours.  FINDINGS: 1523 hours. Three AP and 1 lateral view. The AP images demonstrate soft tissue expanders with trans pedicle screws throughout the lumbar spine. Lateral view images trans pedicle screw fixation at L2 through S1. Grade 1 L5-S1 anterolisthesis. No acute hardware complication.  IMPRESSION: Intraoperative imaging of lumbosacral spine fixation.   Electronically Signed   By: Jeronimo GreavesKyle  Talbot M.D.   On: 10/26/2014 16:10    Assessment/Plan: Improving   LOS: 1 day  Echo per pulmonology/cardiology. Mobilize with PT in [facility]   Nicholus Chandran, Arlys JohnBrian 10/27/2014, 7:48 AM

## 2014-10-27 NOTE — Evaluation (Signed)
Occupational Therapy Evaluation Patient Details Name: Troy Collier MRN: [id number] DOB: 15-Dec-1945 Today's Date: 10/27/2014    History of Present Illness pt presents with L2-S1 PLIF.     Clinical Impression   This 69 yo male admitted and underwent above presents to acute OT back precautions, decreased mobility, decreased balance, increased pain all affecting his ability to care for himself at an independent level as he was pta. He will benefit from acute OT with follow up at SNF to get to an independent to Mod I level to return home.    Follow Up Recommendations  SNF    Equipment Recommendations   (TBD at next venue)       Precautions / Restrictions Precautions Precautions: Back Precaution Comments: Pt able to recall no bending or twisting Required Braces or Orthoses: Spinal Brace Spinal Brace: Lumbar corset;Applied in sitting position Restrictions Weight Bearing Restrictions: No      Mobility Bed Mobility Overal bed mobility: Needs Assistance Bed Mobility: Sit to Sidelying   Sidelying to sit: Min assist (for legs and proper technique)          Transfers Overall transfer level: Needs assistance Equipment used: Rolling walker (2 wheeled) Transfers: Sit to/from Stand Sit to Stand: Min assist                   ADL Overall ADL's : Needs assistance/impaired Eating/Feeding: Independent;Sitting   Grooming: Set up;Sitting   Upper Body Bathing: Set up;Sitting   Lower Body Bathing: Moderate assistance (with min A sit<>stand)   Upper Body Dressing : Set up;Supervision/safety;Sitting   Lower Body Dressing: Maximal assistance (with min A sit<>stand)   Toilet Transfer: Minimal assistance;Ambulation;RW (recliner>ambulated 50 feet>bed)   Toileting- Clothing Manipulation and Hygiene: Moderate assistance (min A sit<>stand)                         Pertinent Vitals/Pain Pain Assessment: 0-10 Pain Score: 7  Pain Location: back Pain Descriptors /  Indicators: Sore;Aching Pain Intervention(s): Monitored during session;Repositioned (RN gave pt pain meds before I started working with him)     Hand Dominance Right   Extremity/Trunk Assessment Upper Extremity Assessment Upper Extremity Assessment: Overall WFL for tasks assessed           Communication Communication Communication: No difficulties   Cognition Arousal/Alertness: Awake/alert Behavior During Therapy: WFL for tasks assessed/performed Overall Cognitive Status: Within Functional Limits for tasks assessed                                Home Living Family/patient expects to be discharged to:: Skilled nursing facility                                        Prior Functioning/Environment Level of Independence: Independent             OT Diagnosis: Generalized weakness;Acute pain   OT Problem List: Decreased strength;Decreased range of motion;Impaired balance (sitting and/or standing);Pain;Decreased knowledge of precautions;Decreased knowledge of use of DME or AE   OT Treatment/Interventions: Self-care/ADL training;DME and/or AE instruction;Balance training;Patient/family education    OT Goals(Current goals can be found in the care plan section) Acute Rehab OT Goals Patient Stated Goal: to go to rehab and then home OT Goal Formulation: With patient Time For Goal Achievement: 11/03/14 Potential to Achieve Goals: Good  OT Frequency: Min 2X/week   Barriers to D/C: Decreased caregiver support             End of Session Equipment Utilized During Treatment: Gait belt;Rolling walker;Back brace Nurse Communication:  (Pt +1 A)  Activity Tolerance: Patient tolerated treatment well Patient left: in bed;with call bell/phone within reach;with bed alarm set   Time: 6578-46961404-1436 OT Time Calculation (min): 32 min Charges:  OT General Charges $OT Visit: 1 Procedure OT Evaluation $Initial OT Evaluation Tier I: 1 Procedure $OT Re-eval: 1  Procedure OT Treatments $Self Care/Home Management : 8-22 mins  Evette GeorgesLeonard, Ranessa Kosta Eva [phone] 10/27/2014, 4:23 PM

## 2014-10-27 NOTE — Evaluation (Signed)
Physical Therapy Evaluation Patient Details Name: Troy Collier MRN: [id number] DOB: 03/20/1946 Today's Date: 10/27/2014   History of Present Illness  pt presents with L2-S1 PLIF.    Clinical Impression  Pt moving well and very receptive to cueing and following back precautions.  Pt indicates he lives alone and only has friends and neighbors that can only provide limited A.  Pt would benefit from SNF at D/C to maximize independence prior to returning to home alone.  Will continue to follow.      Follow Up Recommendations SNF    Equipment Recommendations  None recommended by PT    Recommendations for Other Services       Precautions / Restrictions Precautions Precautions: Back Precaution Booklet Issued: Yes (comment) Required Braces or Orthoses: Spinal Brace Spinal Brace: Lumbar corset;Applied in sitting position Restrictions Weight Bearing Restrictions: No      Mobility  Bed Mobility Overal bed mobility: Needs Assistance Bed Mobility: Rolling;Sidelying to Sit Rolling: Min guard Sidelying to sit: Min assist       General bed mobility comments: cues for log roll technique.  pt performing well.    Transfers Overall transfer level: Needs assistance Equipment used: Rolling walker (2 wheeled) Transfers: Sit to/from Stand Sit to Stand: Min assist         General transfer comment: cues for UE use and safe technique.    Ambulation/Gait Ambulation/Gait assistance: Min guard Ambulation Distance (Feet): 65 Feet Assistive device: Rolling walker (2 wheeled) Gait Pattern/deviations: Step-through pattern;Decreased stride length     General Gait Details: cues for positioning within RW, upright psoture and encouragement.    Stairs            Wheelchair Mobility    Modified Rankin (Stroke Patients Only)       Balance Overall balance assessment: Needs assistance Sitting-balance support: No upper extremity supported;Feet supported Sitting balance-Leahy Scale:  Good     Standing balance support: Single extremity supported;During functional activity Standing balance-Leahy Scale: Poor                               Pertinent Vitals/Pain Pain Assessment: 0-10 Pain Score: 8  Pain Location: Back Pain Descriptors / Indicators: Aching Pain Intervention(s): Monitored during session;Premedicated before session;Repositioned    Home Living Family/patient expects to be discharged to:: Skilled nursing facility                      Prior Function Level of Independence: Independent               Hand Dominance        Extremity/Trunk Assessment   Upper Extremity Assessment: Defer to OT evaluation           Lower Extremity Assessment: Overall WFL for tasks assessed      Cervical / Trunk Assessment: Normal  Communication   Communication: No difficulties  Cognition Arousal/Alertness: Awake/alert Behavior During Therapy: WFL for tasks assessed/performed Overall Cognitive Status: Within Functional Limits for tasks assessed                      General Comments      Exercises        Assessment/Plan    PT Assessment Patient needs continued PT services  PT Diagnosis Difficulty walking   PT Problem List Decreased activity tolerance;Decreased balance;Decreased mobility;Decreased knowledge of use of DME;Decreased knowledge of precautions;Pain  PT Treatment Interventions DME instruction;Gait  training;Stair training;Functional mobility training;Therapeutic activities;Therapeutic exercise;Balance training;Neuromuscular re-education;Patient/family education   PT Goals (Current goals can be found in the Care Plan section) Acute Rehab PT Goals Patient Stated Goal: Get strong again.   PT Goal Formulation: With patient Time For Goal Achievement: 11/03/14 Potential to Achieve Goals: Good    Frequency Min 5X/week   Barriers to discharge        Co-evaluation               End of Session Equipment  Utilized During Treatment: Gait belt;Back brace Activity Tolerance: Patient tolerated treatment well Patient left: in chair;with call bell/phone within reach Nurse Communication: Mobility status         Time: 9528-4132 PT Time Calculation (min) (ACUTE ONLY): 27 min   Charges:   PT Evaluation $Initial PT Evaluation Tier I: 1 Procedure PT Treatments $Gait Training: 8-22 mins   PT G CodesSunny Schlein, [place] [phone] 10/27/2014, 10:42 AM

## 2014-10-27 NOTE — Clinical Social Work Note (Signed)
Clinical Social Work Department BRIEF PSYCHOSOCIAL ASSESSMENT 10/27/2014  Patient:  Troy Collier,Troy Collier     Account Number:  [id number]     Admit date:  10/26/2014  Clinical Social Worker:  SCINTO,JESSE, LCSW  Date/Time:  10/27/2014 10:30 AM  Referred by:  Physician  Date Referred:  10/27/2014 Referred for  SNF Placement   Other Referral:   Interview type:  Patient Other interview type:   No family/friends at bedside    PSYCHOSOCIAL DATA Living Status:  ALONE Admitted from facility:   Level of care:   Primary support name:  Silverman,Cheri  [phone] Primary support relationship to patient:  PARTNER Degree of support available:   Strong and supportive    CURRENT CONCERNS Current Concerns  Post-Acute Placement   Other Concerns:    SOCIAL WORK ASSESSMENT / PLAN Clinical Social Worker met with patient at bedside to offer support and discuss patient needs at discharge.  Patient states that he lives at home alone and is in agreement with rehab prior to going home.  Patient states that he lives in [place] but may prefer placement in [place] to be close to his work family.  CSW initiated SNF search in [place], [place], and [place] counties.  CSW to follow up with available bed offers.  CSW remains available for support and to facilitate patient discharge needs once medically stable.   Assessment/plan status:  Psychosocial Support/Ongoing Assessment of Needs Other assessment/ plan:   Information/referral to community resources:   Clinical Social Worker to provide patient with facility list once bed offers available.  Patient is to have visitors today who will provide patient with contact information    PATIENT'S/FAMILY'S RESPONSE TO PLAN OF CARE: Patient alert and oriented x3 sitting up in the chair. Patient with limited family support but states that he has a large work family who is supportive.  Patient is in agreement with SNF placement prior to return home.  Patient  verbalized understanding of CSW role and appreciation for support and involvement.        

## 2014-10-27 NOTE — Care Management Note (Signed)
  Page 1 of 1   10/27/2014     8:34:39 AM CARE MANAGEMENT NOTE 10/27/2014  Patient:  Troy Collier,Troy Collier   Account Number:  [id number]  Date Initiated:  10/27/2014  Documentation initiated by:  Ronny FlurryWILE,Shaconda Hajduk  Subjective/Objective Assessment:   Lumbar two-three, Lumba three-four Posterior Lateral Arthrodesis, Lumbar four-five TLIF Interbody Fusion, Lumbar five-Sacral one Posterior lumbar interbody fusion (N/A) - L2-3 L3-4 L4-5 L5-S1 Pedicle screw fixation and posterolateral arthro     Action/Plan:   Anticipated DC Date:     Anticipated DC Plan:           Choice offered to / List presented to:             Status of service:   Medicare Important Message given?   (If response is "NO", the following Medicare IM given date fields will be blank) Date Medicare IM given:   Medicare IM given by:   Date Additional Medicare IM given:   Additional Medicare IM given by:    Discharge Disposition:    Per UR Regulation:  Reviewed for med. necessity/level of care/duration of stay  If discussed at Long Length of Stay Meetings, dates discussed:    Comments:  10-27-14 Consult for : PT/OT/SLP DME as needed Reason for consult:->Home health needs Reason for consult:->Equipment Reason for consult:->Medication needs Reason for consult:->Other (see comments)   Will await PT / OT evals.  Ronny FlurryHeather Emmalin Jaquess RN BSN

## 2014-10-27 NOTE — Clinical Social Work Placement (Addendum)
Clinical Social Work Department CLINICAL SOCIAL WORK PLACEMENT NOTE 10/27/2014  Patient:  Troy Collier,Troy Collier  Account Number:  [id number] Admit date:  10/26/2014  Clinical Social Worker:  Macario GoldsJESSE SCINTO, LCSW  Date/time:  10/27/2014 11:00 AM  Clinical Social Work is seeking post-discharge placement for this patient at the following level of care:   SKILLED NURSING   (*CSW will update this form in Epic as items are completed)   10/27/2014  Patient/family provided with [facility] Department of Clinical Social Work's list of facilities offering this level of care within the geographic area requested by the patient (or if unable, by the patient's family).  10/27/2014  Patient/family informed of their freedom to choose among providers that offer the needed level of care, that participate in Medicare, Medicaid or managed care program needed by the patient, have an available bed and are willing to accept the patient.  10/27/2014  Patient/family informed of [facility]' ownership interest in [facility], as well as of the fact that they are under no obligation to receive care at this facility.  PASARR submitted to EDS on 10/27/2014 PASARR number received on 10/27/2014  FL2 transmitted to all facilities in geographic area requested by pt/family on  10/27/2014 FL2 transmitted to all facilities within larger geographic area on   Patient informed that his/her managed care company has contracts with or will negotiate with  certain facilities, including the following:     Patient/family informed of bed offers received:  10[facility]28/14 [facility]  Physician recommends and patient chooses bed at    Patient to be transferred to  [facility] on 10/30/2014   Patient to be transferred to facility by [facility] Patient and family notified of transfer on  10/30/2014 Name of family member notified:  Pt notified CSW that he will inform  family of transfer.   The following physician request were entered in Epic:   Additional Comments:

## 2014-10-28 ENCOUNTER — Encounter (HOSPITAL_COMMUNITY): Payer: Self-pay | Admitting: *Deleted

## 2014-10-28 DIAGNOSIS — I97710 Intraoperative cardiac arrest during cardiac surgery: Secondary | ICD-10-CM

## 2014-10-28 NOTE — Progress Notes (Signed)
Physical Therapy Treatment Patient Details Name: Troy Collier MRN: [id number] DOB: [birth year] Today's Date: 10/28/2014    History of Present Illness pt presents with L2-S1 PLIF.      PT Comments    Patient progressing well in hospital environment. Ongoing education re: back precautions while mobilizing. Pt lives alone and will need to be independent with mobility and basic ADLs prior to returning home.  Follow Up Recommendations  SNF     Equipment Recommendations  None recommended by PT    Recommendations for Other Services OT consult     Precautions / Restrictions Precautions Precautions: Back Precaution Booklet Issued: Yes (comment) Precaution Comments: Pt required 1 vc to recall 3/3 back precautions; required vc x 2 to maintain while up mobilizing Required Braces or Orthoses: Spinal Brace Spinal Brace: Lumbar corset;Applied in sitting position Restrictions Weight Bearing Restrictions: No    Mobility  Bed Mobility                  Transfers Overall transfer level: Needs assistance Equipment used: Rolling walker (2 wheeled) Transfers: Sit to/from Stand Sit to Stand: Min guard         General transfer comment: cues for UE use, safe use of RW, and safe technique   Ambulation/Gait Ambulation/Gait assistance: Min guard Ambulation Distance (Feet): 150 Feet Assistive device: Rolling walker (2 wheeled) Gait Pattern/deviations: Step-through pattern;Decreased stride length   Gait velocity interpretation: Below normal speed for age/gender General Gait Details: cues for positioning within RW, upright posture; vc to not reach for items on table (causing him to flex spine)   Stairs            Wheelchair Mobility    Modified Rankin (Stroke Patients Only)       Balance     Sitting balance-Leahy Scale: Good       Standing balance-Leahy Scale: Poor                      Cognition Arousal/Alertness: Awake/alert Behavior During Therapy:  WFL for tasks assessed/performed Overall Cognitive Status: Within Functional Limits for tasks assessed                      Exercises      General Comments        Pertinent Vitals/Pain Pain Assessment: 0-10 Pain Score: 7  Pain Location: back and anterior thighs Pain Descriptors / Indicators: Aching Pain Intervention(s): Limited activity within patient's tolerance;Monitored during session;Repositioned    Home Living                      Prior Function            PT Goals (current goals can now be found in the care plan section) Acute Rehab PT Goals Patient Stated Goal: Get strong again.   Progress towards PT goals: Progressing toward goals    Frequency  Min 5X/week    PT Plan Current plan remains appropriate    Co-evaluation             End of Session Equipment Utilized During Treatment: Gait belt;Back brace Activity Tolerance: Patient tolerated treatment well Patient left: in chair;with call bell/phone within reach;with chair alarm set;with family/visitor present     Time: 7829-5621 PT Time Calculation (min) (ACUTE ONLY): 16 min  Charges:  $Gait Training: 8-22 mins                    G Codes:  Skylyn Slezak 10/28/2014, 4:17 PM Pager [phone]

## 2014-10-28 NOTE — Progress Notes (Signed)
  Echocardiogram 2D Echocardiogram has been performed.  Troy Collier, [name] M 10/28/2014, 10:48 AM

## 2014-10-28 NOTE — Progress Notes (Signed)
No issues overnight. Pt reports back soreness, able to walk with walker.   EXAM:  BP 125/69 mmHg  Pulse 86  Temp(Src) 98.6 F (37 C) (Oral)  Resp 19  Ht 5' 7.5" (1.715 m)  Wt 95.2 kg (209 lb 14.1 oz)  BMI 32.37 kg/m2  SpO2 100%  Awake, alert, oriented  Speech fluent, appropriate  CN grossly intact  5/5 BUE/BLE  Wound c/d/i, drain in place 225cc x 24 hrs  IMPRESSION:  69 y.o. male POD#2 s/p lumbar fusion, doing well.  - Echo today for complete w/u of intraoperative hypotension  PLAN: - Cont to mobilize - Will leave drain in one more day - f/u echo results

## 2014-10-29 MED ORDER — BISACODYL 5 MG PO TBEC
5.0000 mg | DELAYED_RELEASE_TABLET | Freq: Every day | ORAL | Status: DC | PRN
  Administered 2014-10-29: 5 mg via ORAL
  Filled 2014-10-29: qty 1

## 2014-10-29 NOTE — Progress Notes (Signed)
Subjective: Patient reports improving  Objective: Vital signs in last 24 hours: Temp:  [97.8 F (36.6 C)-98.6 F (37 C)] 98.6 F (37 C) (02/28 0536) Pulse Rate:  [79-88] 88 (02/28 0536) Resp:  [17-20] 20 (02/28 0536) BP: (121-154)/(62-79) 154/79 mmHg (02/28 0536) SpO2:  [93 %-100 %] 94 % (02/28 0536)  Intake/Output from previous day: 02/27 0701 - 02/28 0700 In: 775 [P.O.:775] Out: 1777 [Urine:1647; Drains:130] Intake/Output this shift:    Physical Exam: Dressing CDI.  Strength full in legs.  Pain in legs improving  Lab Results:  Recent Labs  10/26/14 1424  HGB 10.9*  HCT 32.0*   BMET  Recent Labs  10/26/14 1424  NA 138  K 3.0*    Studies/Results: No results found.  Assessment/Plan: D/C drain.  Continue to mobilize.  Plan on D/C to SNF in am.    LOS: 3 days    Dorian HeckleSTERN,Analeise Mccleery D, MD 10/29/2014, 9:15 AM

## 2014-10-29 NOTE — Progress Notes (Signed)
Per MD order removed hemovacc.  Patient tolerated well.

## 2014-10-30 ENCOUNTER — Encounter (HOSPITAL_COMMUNITY): Payer: Self-pay | Admitting: Neurosurgery

## 2014-10-30 MED FILL — Sodium Chloride Irrigation Soln 0.9%: Qty: 3000 | Status: AC

## 2014-10-30 MED FILL — Sodium Chloride IV Soln 0.9%: INTRAVENOUS | Qty: 1000 | Status: AC

## 2014-10-30 MED FILL — Heparin Sodium (Porcine) Inj 1000 Unit/ML: INTRAMUSCULAR | Qty: 30 | Status: AC

## 2014-10-30 NOTE — Progress Notes (Signed)
Physical Therapy Treatment Patient Details Name: Troy Collier MRN: [id number] DOB: 06/24/1946 Today's Date: 10/30/2014    History of Present Illness pt presents with L2-S1 PLIF.      PT Comments    Pt progressing well however with multiple concerns/questions re: caring for himself at home. Continues to require cuing to maintain back precautions and for proper use of RW. Anticipate may need basket for RW to incr safety/ease of ADLs.    Follow Up Recommendations  SNF     Equipment Recommendations  None recommended by PT    Recommendations for Other Services       Precautions / Restrictions Precautions Precautions: Back Precaution Booklet Issued: Yes (comment) Precaution Comments: recalled 3/3 back precautions (verbally); 1 cue to maintain (twisting to reach object) Required Braces or Orthoses: Spinal Brace Spinal Brace: Lumbar corset;Applied in sitting position Restrictions Weight Bearing Restrictions: No    Mobility  Bed Mobility Overal bed mobility: Needs Assistance Bed Mobility: Sidelying to Sit;Sit to Sidelying;Rolling Rolling: Supervision Sidelying to sit: Min guard     Sit to sidelying: Min guard General bed mobility comments: vc for technique to maintain back precautions; especially to prevent twisting  Transfers Overall transfer level: Needs assistance Equipment used: Rolling walker (2 wheeled) Transfers: Sit to/from Stand Sit to Stand: Supervision Stand pivot transfers: Min guard       General transfer comment: educated on transfer to/from low surface (couch); discussed ideal chair/position for sitting at home and how to modifiy with small pillow, rolled blanket for upright posture  Ambulation/Gait Ambulation/Gait assistance: Supervision;Min assist Ambulation Distance (Feet): 40 Feet (150 with RW) Assistive device: Rolling walker (2 wheeled);None Gait Pattern/deviations: Step-through pattern;Decreased stride length   Gait velocity interpretation:  Below normal speed for age/gender General Gait Details: very guarded and reports feeling unsteady with no device; vc to not lift/carry RW when ambulating (to decr strain on back)   Careers information officer    Modified Rankin (Stroke Patients Only)       Balance     Sitting balance-Leahy Scale: Good       Standing balance-Leahy Scale: Poor                      Cognition Arousal/Alertness: Awake/alert Behavior During Therapy: WFL for tasks assessed/performed Overall Cognitive Status: Within Functional Limits for tasks assessed                      Exercises      General Comments General comments (skin integrity, edema, etc.): Pt inquiring re: how he will manage everyday tasks at home while using RW; general overview given with ? need for basket for his RW; assured pt these are areas he will address with therapy at SNF      Pertinent Vitals/Pain Pain Assessment: 0-10 Pain Score: 6  Pain Location: back Pain Descriptors / Indicators: Aching Pain Intervention(s): Limited activity within patient's tolerance;Monitored during session;Repositioned    Home Living                      Prior Function            PT Goals (current goals can now be found in the care plan section) Acute Rehab PT Goals Patient Stated Goal: Get strong again.   Progress towards PT goals: Progressing toward goals    Frequency  Min 5X/week    PT Plan Current plan  remains appropriate    Co-evaluation             End of Session Equipment Utilized During Treatment: Back brace Activity Tolerance: Patient tolerated treatment well Patient left: in chair;with call bell/phone within reach;with chair alarm set     Time: [id number] PT Time Calculation (min) (ACUTE ONLY): 21 min  Charges:  $Gait Training: 8-22 mins                    G Codes:      [name] 10/30/2014, 10:56 AM Pager [phone]

## 2014-10-30 NOTE — Progress Notes (Signed)
Subjective: Patient reports "I'm doing better"  Objective: Vital signs in last 24 hours: Temp:  [97.6 F (36.4 C)-98 F (36.7 C)] 97.6 F (36.4 C) (02/29 0712) Pulse Rate:  [57-87] 57 (02/29 0712) Resp:  [16-18] 18 (02/29 0712) BP: (101-139)/(53-71) 124/68 mmHg (02/29 0712) SpO2:  [96 %-97 %] 96 % (02/29 0712)  Intake/Output from previous day: 02/28 0701 - 02/29 0700 In: -  Out: 92 [Drains:90; Stool:2] Intake/Output this shift:    Alert, conversant, reporting decreased lumbar pain & no leg pain. Incision without erythema, swelling, or drainage beneath honeycomb drsg. Good strength BLE.   Lab Results: No results for input(s): WBC, HGB, HCT, PLT in the last 72 hours. BMET No results for input(s): NA, K, CL, CO2, GLUCOSE, BUN, CREATININE, CALCIUM in the last 72 hours.  Studies/Results: No results found.  Assessment/Plan: Improving   LOS: 4 days  Per DrStern, d/c to SNF when bed available. Rx's for Percocet & Flexeril to chart. Decadron will stop; Hydrocortisone needs to continue. Pt verbalizes understanding of d/c instructions and agrees to schedule 3-4 week f/u with office.    Georgiann Cockeroteat, Giovonnie Trettel 10/30/2014, 7:45 AM

## 2014-10-30 NOTE — Discharge Summary (Signed)
Physician Discharge Summary  Patient ID: Troy Collier MRN: [id number] DOB/AGE: Jun 15, 1946 69 y.o.  Admit date: 10/26/2014 Discharge date: 10/30/2014  Admission Diagnoses: Scoliosis and kyphoscoliosis, idiopathic, Spondylolisthesis, Spinal Stenosis, lumbar region, Radiculopathy, lumbar region L 23, L 34, L 45, L 5 S 1 levels   Discharge Diagnoses: Scoliosis and kyphoscoliosis, idiopathic, Spondylolisthesis, Spinal Stenosis, lumbar region, Radiculopathy, lumbar region L 23, L 34, L 45, L 5 S 1 levels s/p Lumbar two-three, Lumba three-four Posterior Lateral Arthrodesis, Lumbar four-five TLIF Interbody Fusion, Lumbar five-Sacral one Posterior lumbar interbody fusion (N/A) - L2-3 L3-4 L4-5 L5-S1 Pedicle screw fixation and posterolateral arthrodesis, decompressive laminectomy L 23, L 34, L 45, L 5 S 1 levels; decompression greater than standard PLIF procedure  Active Problems:   Scoliosis of lumbar spine   Discharged Condition: good  Hospital Course: Dhillon [place] was admitted for surgery with dx scoliosis, spondylolisthesis, and radiculopathy. Following uncomplicated surgery, he recovered well in Neuro PACU and transferred to Neuro ICU for evaluation of intra-operative hypotension. Critical Care Medicine consulted. He remained normotensive postoperatively and mobilized well.   Consults: pulmonary/intensive care  Significant Diagnostic Studies: radiology: X-Ray: intra-operative  Treatments: surgery: Lumbar two-three, Lumba three-four Posterior Lateral Arthrodesis, Lumbar four-five TLIF Interbody Fusion, Lumbar five-Sacral one Posterior lumbar interbody fusion (N/A) - L2-3 L3-4 L4-5 L5-S1 Pedicle screw fixation and posterolateral arthrodesis, decompressive laminectomy L 23, L 34, L 45, L 5 S 1 levels; decompression greater than standard PLIF procedure   Discharge Exam: Blood pressure 124/68, pulse 57, temperature 97.6 F (36.4 C), temperature source Oral, resp. rate 18, height 5' 7.5" (1.715 m),  weight 95.2 kg (209 lb 14.1 oz), SpO2 96 %. Alert, conversant, reporting decreased lumbar pain & no leg pain. Incision without erythema, swelling, or drainage beneath honeycomb drsg. Good strength BLE.    Disposition: Discharge to skilled nursing facility. Rx's for Percocet & Flexeril to chart. Decadron will stop; Hydrocortisone needs to continue. Pt verbalizes understanding of d/c instructions and agrees to schedule 3-4 week f/u with office.       Medication List    TAKE these medications        acetaminophen-codeine 300-30 MG per tablet  Commonly known as:  TYLENOL #3  Take 1 tablet by mouth every 4 (four) hours as needed for pain (can take twice a day).     ALPRAZolam 0.5 MG tablet  Commonly known as:  XANAX  Take 0.5 mg by mouth at bedtime as needed for sleep.     aspirin 81 MG tablet  Take 81 mg by mouth daily.     atorvastatin 10 MG tablet  Commonly known as:  LIPITOR  Take 10 mg by mouth daily.     calcium carbonate 600 MG Tabs tablet  Commonly known as:  OS-CAL  Take 600 mg by mouth 2 (two) times daily with a meal.     cyclobenzaprine 10 MG tablet  Commonly known as:  FLEXERIL  Take 10 mg by mouth 3 (three) times daily as needed for muscle spasms.     desmopressin 0.2 MG tablet  Commonly known as:  DDAVP  TAKE 2 TABLETS (0.4MG ) 3   TIMES DAILY     DULoxetine 60 MG capsule  Commonly known as:  CYMBALTA  Take 60 mg by mouth daily.     etodolac 400 MG tablet  Commonly known as:  LODINE  Take 400 mg by mouth 2 (two) times daily.     GLUCOSAMINE MSM COMPLEX PO  Take 1 tablet by mouth  daily.     HYDROcodone-acetaminophen 5-325 MG per tablet  Commonly known as:  NORCO/VICODIN  Take 1 tablet by mouth every 6 (six) hours as needed for pain.     hydrocortisone 5 MG tablet  Commonly known as:  CORTEF  2 tabs in the AM and 1 tab at 5 pm     levothyroxine 150 MCG tablet  Commonly known as:  SYNTHROID, LEVOTHROID  Take 1 tablet (150 mcg total) by mouth daily  before breakfast.     lidocaine 5 %  Commonly known as:  LIDODERM  Place 1 patch onto the skin daily. Remove & Discard patch within 12 hours or as directed by MD     losartan 100 MG tablet  Commonly known as:  COZAAR  Take 100 mg by mouth daily.     methocarbamol 500 MG tablet  Commonly known as:  ROBAXIN  Take 500 mg by mouth daily as needed for muscle spasms.     multivitamin with minerals Tabs tablet  Take 1 tablet by mouth daily.     psyllium 28 % packet  Commonly known as:  METAMUCIL SMOOTH TEXTURE  Take 1 packet by mouth 2 (two) times daily.     sildenafil 50 MG tablet  Commonly known as:  VIAGRA  Take 50 mg by mouth daily as needed for erectile dysfunction.     Somatropin 15 MG/1.5ML Soln  0.2 mg injection daily.     tamsulosin 0.4 MG Caps capsule  Commonly known as:  FLOMAX  Take 0.4 mg by mouth daily after supper.     Testosterone 30 MG/ACT Soln  Commonly known as:  AXIRON  Place 30 mg onto the skin 4 (four) times daily.     traZODone 100 MG tablet  Commonly known as:  DESYREL  Take 100 mg by mouth at bedtime.     vitamin C 500 MG tablet  Commonly known as:  ASCORBIC ACID  Take 500 mg by mouth 2 (two) times daily.     Vitamin D 2000 UNITS tablet  Take 2,000 Units by mouth daily.         Signed: Georgiann Cockeroteat, Mery Guadalupe 10/30/2014, 7:53 AM

## 2014-10-30 NOTE — Clinical Social Work Note (Signed)
Clinical Social Worker facilitated patient discharge including contacting patient family and facility to confirm patient discharge plans.  Clinical information faxed to facility and family agreeable with plan.  CSW arranged ambulance transport via [facility] to [facility].  RN to call report prior to discharge.  DC packet on chart for transport.   Clinical Social Worker will sign off for now as social work intervention is no longer needed. Please consult us again if new need arises.  Derenda FennelBashira Kache Mcclurg, MSW, LCSWA [phone] 10/30/2014 11:08 AM

## 2014-10-30 NOTE — Progress Notes (Signed)
Occupational Therapy Treatment Patient Details Name: Sarkis Rhines MRN: [id number] DOB: 02-20-1946 Today's Date: 10/30/2014    History of present illness pt presents with L2-S1 PLIF.     OT comments  Pt. Progressing well with acute OT stated goals.  Safe demonstration of toileting and shower stall transfers.  Able to return demo of use for A/E.  Plans are still for SNF secondary to lives alone, however if he continues to progress at this rate home may be an option, will see how he does with PT.   Follow Up Recommendations  SNF , home may be an option based on how he does with PT.    Equipment Recommendations       Recommendations for Other Services      Precautions / Restrictions Precautions Precautions: Back Precaution Comments: reviewed precautions, pt. able to integrate during functional mobility in room Required Braces or Orthoses: Spinal Brace Spinal Brace: Lumbar corset;Applied in sitting position Restrictions Weight Bearing Restrictions: No       Mobility Bed Mobility               General bed mobility comments: up in recliner upon arrival but reports he is still requiring use of bed rails for oob  Transfers Overall transfer level: Needs assistance Equipment used: Rolling walker (2 wheeled) Transfers: Sit to/from [employer] Sit to Stand: Min guard Stand pivot transfers: Min guard       General transfer comment: cues for UE use, safe use of RW, and safe technique , pt. had tendancy to pick walker up and carry it during ambulation, cues to keep walker on the ground, says he doesn't feel he needs it 100% of the time but says sometimes he gets tired and that is when he is glad he has it with him    Balance                                   ADL Overall ADL's : Needs assistance/impaired     Grooming: Supervision/safety;Standing         Lower Body Bathing Details (indicate cue type and reason): provided demo of use of LH  sponge for LB bathing needs Upper Body Dressing : Set up;Supervision/safety;Sitting Upper Body Dressing Details (indicate cue type and reason): pt. able to don brace  Lower Body Dressing: Set up;Sit to/from stand;Sitting/lateral leans;With adaptive equipment;Cueing for sequencing Lower Body Dressing Details (indicate cue type and reason): return demo of don/doff socks with reacher and sock-aide, also demo for don/doff undergarments/pants Toilet Transfer: Supervision/safety;RW;Grab bars;Comfort height toilet   Toileting- Clothing Manipulation and Hygiene: Sitting/lateral lean;Supervision/safety   Tub/ Shower Transfer: Walk-in shower;Min guard;Ambulation;Rolling walker;Grab bars;Anterior/posterior Tub/Shower Transfer Details (indicate cue type and reason): reports a friend of his is looking into installing a grab bar for him  Functional mobility during ADLs: Min guard General ADL Comments: progressing well, interested in A/E and plans to purchase.  safe toilet and shower stall transfers      Vision                     Perception     Praxis      Cognition   Behavior During Therapy: [facility] for tasks assessed/performed Overall Cognitive Status: Within Functional Limits for tasks assessed                       Extremity/Trunk Assessment  Exercises     Shoulder Instructions       General Comments      Pertinent Vitals/ Pain       Pain Assessment: 0-10 Pain Score: 6  Pain Location: back Pain Descriptors / Indicators: Aching Pain Intervention(s): Repositioned;Premedicated before session;Monitored during session  Home Living                                          Prior Functioning/Environment              Frequency       Progress Toward Goals  OT Goals(current goals can now be found in the care plan section)  Progress towards OT goals: Progressing toward goals     Plan Discharge plan remains appropriate     Co-evaluation                 End of Session Equipment Utilized During Treatment: Gait belt;Rolling walker;Back brace   Activity Tolerance Patient tolerated treatment well   Patient Left in chair;with call bell/phone within reach   Nurse Communication          Time: 8295-62130901-0934 OT Time Calculation (min): 33 min  Charges: OT General Charges $OT Visit: 1 Procedure OT Treatments $Self Care/Home Management : 23-37 mins  Robet LeuMorris, Gerrad Welker Lorraine, COTA/L 10/30/2014, 9:42 AM

## 2014-11-15 ENCOUNTER — Telehealth: Payer: Self-pay | Admitting: Endocrinology

## 2014-11-15 ENCOUNTER — Other Ambulatory Visit: Payer: Self-pay | Admitting: Endocrinology

## 2014-11-15 NOTE — Telephone Encounter (Signed)
Pt needs refill on norditropin please call pt when called in @ [phone]

## 2014-11-15 NOTE — Telephone Encounter (Signed)
Ok to refill 

## 2014-11-16 MED ORDER — SOMATROPIN 15 MG/1.5ML ~~LOC~~ SOLN: SUBCUTANEOUS | Status: DC

## 2014-11-16 NOTE — Telephone Encounter (Signed)
Contacted pt. The medication he needed refilled was the somatropin. Medication has been refilled per request and sent to [facility].

## 2014-11-28 ENCOUNTER — Encounter (HOSPITAL_COMMUNITY): Payer: Self-pay | Admitting: Emergency Medicine

## 2014-11-28 ENCOUNTER — Inpatient Hospital Stay (HOSPITAL_COMMUNITY)
Admission: EM | Admit: 2014-11-28 | Discharge: 2014-12-08 | DRG: 856 | Disposition: A | Discharge status: Skilled Nursing Facility | Payer: Medicare Other | Attending: Neurosurgery | Admitting: Neurosurgery

## 2014-11-28 ENCOUNTER — Emergency Department (HOSPITAL_COMMUNITY): Payer: Medicare Other

## 2014-11-28 ENCOUNTER — Encounter (HOSPITAL_COMMUNITY)
Admission: EM | Disposition: A | Discharge status: Skilled Nursing Facility | Payer: Self-pay | Source: Home / Self Care | Attending: Neurosurgery

## 2014-11-28 ENCOUNTER — Telehealth: Payer: Self-pay | Admitting: Endocrinology

## 2014-11-28 ENCOUNTER — Emergency Department (HOSPITAL_COMMUNITY): Payer: Medicare Other | Admitting: Certified Registered Nurse Anesthetist

## 2014-11-28 DIAGNOSIS — E119 Type 2 diabetes mellitus without complications: Secondary | ICD-10-CM | POA: Diagnosis present

## 2014-11-28 DIAGNOSIS — T814XXA Infection following a procedure, initial encounter: Principal | ICD-10-CM | POA: Diagnosis present

## 2014-11-28 DIAGNOSIS — M4806 Spinal stenosis, lumbar region: Secondary | ICD-10-CM | POA: Diagnosis present

## 2014-11-28 DIAGNOSIS — E785 Hyperlipidemia, unspecified: Secondary | ICD-10-CM | POA: Diagnosis present

## 2014-11-28 DIAGNOSIS — I10 Essential (primary) hypertension: Secondary | ICD-10-CM | POA: Diagnosis present

## 2014-11-28 DIAGNOSIS — M797 Fibromyalgia: Secondary | ICD-10-CM | POA: Diagnosis present

## 2014-11-28 DIAGNOSIS — R110 Nausea: Secondary | ICD-10-CM | POA: Diagnosis not present

## 2014-11-28 DIAGNOSIS — Z8249 Family history of ischemic heart disease and other diseases of the circulatory system: Secondary | ICD-10-CM | POA: Diagnosis not present

## 2014-11-28 DIAGNOSIS — R7309 Other abnormal glucose: Secondary | ICD-10-CM | POA: Diagnosis not present

## 2014-11-28 DIAGNOSIS — M1990 Unspecified osteoarthritis, unspecified site: Secondary | ICD-10-CM | POA: Diagnosis present

## 2014-11-28 DIAGNOSIS — G061 Intraspinal abscess and granuloma: Secondary | ICD-10-CM | POA: Diagnosis present

## 2014-11-28 DIAGNOSIS — R52 Pain, unspecified: Secondary | ICD-10-CM

## 2014-11-28 DIAGNOSIS — L0291 Cutaneous abscess, unspecified: Secondary | ICD-10-CM

## 2014-11-28 HISTORY — PX: LUMBAR WOUND DEBRIDEMENT: SHX1988

## 2014-11-28 LAB — CBC WITH DIFFERENTIAL/PLATELET
Basophils Absolute: 0 10*3/uL (ref 0.0–0.1)
Basophils Relative: 1 % (ref 0–1)
EOS PCT: 0 % (ref 0–5)
Eosinophils Absolute: 0 10*3/uL (ref 0.0–0.7)
HEMATOCRIT: 33.5 % — AB (ref 39.0–52.0)
HEMOGLOBIN: 11 g/dL — AB (ref 13.0–17.0)
LYMPHS PCT: 22 % (ref 12–46)
Lymphs Abs: 1.4 10*3/uL (ref 0.7–4.0)
MCH: 30.1 pg (ref 26.0–34.0)
MCHC: 32.8 g/dL (ref 30.0–36.0)
MCV: 91.5 fL (ref 78.0–100.0)
MONO ABS: 0.5 10*3/uL (ref 0.1–1.0)
Monocytes Relative: 7 % (ref 3–12)
Neutro Abs: 4.5 10*3/uL (ref 1.7–7.7)
Neutrophils Relative %: 70 % (ref 43–77)
PLATELETS: 608 10*3/uL — AB (ref 150–400)
RBC: 3.66 MIL/uL — ABNORMAL LOW (ref 4.22–5.81)
RDW: 12.8 % (ref 11.5–15.5)
WBC: 6.5 10*3/uL (ref 4.0–10.5)

## 2014-11-28 LAB — GRAM STAIN

## 2014-11-28 LAB — BASIC METABOLIC PANEL
Anion gap: 11 (ref 5–15)
BUN: 15 mg/dL (ref 6–23)
CALCIUM: 9.3 mg/dL (ref 8.4–10.5)
CO2: 27 mmol/L (ref 19–32)
Chloride: 99 mmol/L (ref 96–112)
Creatinine, Ser: 0.79 mg/dL (ref 0.50–1.35)
GFR calc Af Amer: >90 mL/min (ref >90)
GFR calc non Af Amer: >90 mL/min (ref >90)
Glucose, Bld: 97 mg/dL (ref 70–99)
POTASSIUM: 4.4 mmol/L (ref 3.5–5.1)
Sodium: 137 mmol/L (ref 135–145)

## 2014-11-28 LAB — SEDIMENTATION RATE: SED RATE: 86 mm/h — AB (ref 0–16)

## 2014-11-28 LAB — GLUCOSE, CAPILLARY: Glucose-Capillary: 109 mg/dL — ABNORMAL HIGH (ref 70–99)

## 2014-11-28 SURGERY — LUMBAR WOUND DEBRIDEMENT
Anesthesia: General | Site: Back

## 2014-11-28 MED ORDER — EPHEDRINE SULFATE 50 MG/ML IJ SOLN
INTRAMUSCULAR | Status: AC
  Filled 2014-11-28: qty 1

## 2014-11-28 MED ORDER — SENNOSIDES-DOCUSATE SODIUM 8.6-50 MG PO TABS: 1.0000 | ORAL_TABLET | Freq: Every evening | ORAL | Status: DC | PRN

## 2014-11-28 MED ORDER — TAMSULOSIN HCL 0.4 MG PO CAPS
0.4000 mg | ORAL_CAPSULE | Freq: Every day | ORAL | Status: DC
  Administered 2014-11-29 – 2014-12-07 (×9): 0.4 mg via ORAL
  Filled 2014-11-28 (×9): qty 1

## 2014-11-28 MED ORDER — ONDANSETRON HCL 4 MG/2ML IJ SOLN
INTRAMUSCULAR | Status: DC | PRN
  Administered 2014-11-28: 4 mg via INTRAVENOUS

## 2014-11-28 MED ORDER — MENTHOL 3 MG MT LOZG: 1.0000 | LOZENGE | OROMUCOSAL | Status: DC | PRN

## 2014-11-28 MED ORDER — SOMATROPIN 5 MG/1.5ML ~~LOC~~ SOLN
0.3500 mg | Freq: Every day | SUBCUTANEOUS | Status: DC
  Filled 2014-11-28 (×2): qty 0.11

## 2014-11-28 MED ORDER — PHENYLEPHRINE 40 MCG/ML (10ML) SYRINGE FOR IV PUSH (FOR BLOOD PRESSURE SUPPORT)
PREFILLED_SYRINGE | INTRAVENOUS | Status: AC
  Filled 2014-11-28: qty 10

## 2014-11-28 MED ORDER — SUCCINYLCHOLINE CHLORIDE 20 MG/ML IJ SOLN
INTRAMUSCULAR | Status: DC | PRN
  Administered 2014-11-28: 100 mg via INTRAVENOUS

## 2014-11-28 MED ORDER — OXYCODONE-ACETAMINOPHEN 5-325 MG PO TABS
1.0000 | ORAL_TABLET | ORAL | Status: DC | PRN
  Administered 2014-11-28: 1 via ORAL
  Administered 2014-11-29 – 2014-12-02 (×13): 2 via ORAL
  Filled 2014-11-28 (×14): qty 2

## 2014-11-28 MED ORDER — PROMETHAZINE HCL 25 MG/ML IJ SOLN: 6.2500 mg | INTRAMUSCULAR | Status: DC | PRN

## 2014-11-28 MED ORDER — THROMBIN 20000 UNITS EX SOLR
CUTANEOUS | Status: DC | PRN
  Administered 2014-11-28: 19:00:00 via TOPICAL

## 2014-11-28 MED ORDER — LIDOCAINE HCL (CARDIAC) 20 MG/ML IV SOLN
INTRAVENOUS | Status: AC
  Filled 2014-11-28: qty 5

## 2014-11-28 MED ORDER — SODIUM CHLORIDE 0.9 % IR SOLN
Status: DC | PRN
  Administered 2014-11-28 (×3)

## 2014-11-28 MED ORDER — HYDROMORPHONE HCL 1 MG/ML IJ SOLN
0.2500 mg | INTRAMUSCULAR | Status: DC | PRN
  Administered 2014-11-28 (×3): 0.5 mg via INTRAVENOUS

## 2014-11-28 MED ORDER — PROPOFOL 10 MG/ML IV BOLUS
INTRAVENOUS | Status: AC
Start: 2014-11-28 — End: 2014-11-28
  Filled 2014-11-28: qty 20

## 2014-11-28 MED ORDER — CEFAZOLIN SODIUM-DEXTROSE 2-3 GM-% IV SOLR
INTRAVENOUS | Status: DC | PRN
  Administered 2014-11-28: 2 g via INTRAVENOUS

## 2014-11-28 MED ORDER — CYCLOBENZAPRINE HCL 10 MG PO TABS
ORAL_TABLET | ORAL | Status: AC
Start: 2014-11-28 — End: 2014-11-29
  Filled 2014-11-28: qty 1

## 2014-11-28 MED ORDER — ONDANSETRON HCL 4 MG/2ML IJ SOLN
INTRAMUSCULAR | Status: AC
  Filled 2014-11-28: qty 2

## 2014-11-28 MED ORDER — VANCOMYCIN HCL IN DEXTROSE 1-5 GM/200ML-% IV SOLN
INTRAVENOUS | Status: AC
  Filled 2014-11-28: qty 200

## 2014-11-28 MED ORDER — ROCURONIUM BROMIDE 50 MG/5ML IV SOLN
INTRAVENOUS | Status: AC
  Filled 2014-11-28: qty 1

## 2014-11-28 MED ORDER — LIDOCAINE HCL (CARDIAC) 20 MG/ML IV SOLN
INTRAVENOUS | Status: DC | PRN
  Administered 2014-11-28: 50 mg via INTRAVENOUS

## 2014-11-28 MED ORDER — OXYCODONE HCL 5 MG PO TABS: 5.0000 mg | ORAL_TABLET | Freq: Once | ORAL | Status: DC | PRN

## 2014-11-28 MED ORDER — LOSARTAN POTASSIUM 50 MG PO TABS
100.0000 mg | ORAL_TABLET | Freq: Every day | ORAL | Status: DC
  Administered 2014-11-29 – 2014-12-08 (×10): 100 mg via ORAL
  Filled 2014-11-28 (×10): qty 2

## 2014-11-28 MED ORDER — DESMOPRESSIN ACETATE 0.2 MG PO TABS
0.2000 mg | ORAL_TABLET | Freq: Three times a day (TID) | ORAL | Status: DC
  Administered 2014-11-28 – 2014-12-07 (×27): 0.2 mg via ORAL
  Filled 2014-11-28 (×30): qty 1

## 2014-11-28 MED ORDER — MEPERIDINE HCL 25 MG/ML IJ SOLN: 6.2500 mg | INTRAMUSCULAR | Status: DC | PRN

## 2014-11-28 MED ORDER — PHENOL 1.4 % MT LIQD: 1.0000 | OROMUCOSAL | Status: DC | PRN

## 2014-11-28 MED ORDER — HYDROCORTISONE 5 MG PO TABS
5.0000 mg | ORAL_TABLET | Freq: Every day | ORAL | Status: DC
  Administered 2014-11-29 – 2014-12-08 (×10): 5 mg via ORAL
  Filled 2014-11-28 (×10): qty 1

## 2014-11-28 MED ORDER — MIDAZOLAM HCL 5 MG/5ML IJ SOLN
INTRAMUSCULAR | Status: DC | PRN
  Administered 2014-11-28: 2 mg via INTRAVENOUS

## 2014-11-28 MED ORDER — ONDANSETRON HCL 4 MG/2ML IJ SOLN
4.0000 mg | Freq: Once | INTRAMUSCULAR | Status: AC
  Administered 2014-11-28: 4 mg via INTRAVENOUS
  Filled 2014-11-28: qty 2

## 2014-11-28 MED ORDER — ACETAMINOPHEN 325 MG PO TABS: 650.0000 mg | ORAL_TABLET | ORAL | Status: DC | PRN

## 2014-11-28 MED ORDER — MIDAZOLAM HCL 2 MG/2ML IJ SOLN
INTRAMUSCULAR | Status: AC
  Filled 2014-11-28: qty 2

## 2014-11-28 MED ORDER — LEVOTHYROXINE SODIUM 50 MCG PO TABS
150.0000 ug | ORAL_TABLET | Freq: Every day | ORAL | Status: DC
  Administered 2014-11-29 – 2014-12-08 (×10): 150 ug via ORAL
  Filled 2014-11-28 (×20): qty 1

## 2014-11-28 MED ORDER — DEXAMETHASONE SODIUM PHOSPHATE 4 MG/ML IJ SOLN
10.0000 mg | Freq: Once | INTRAMUSCULAR | Status: AC
  Administered 2014-11-28: 10 mg via INTRAVENOUS
  Filled 2014-11-28: qty 3

## 2014-11-28 MED ORDER — HYDROMORPHONE HCL 1 MG/ML IJ SOLN: 0.2500 mg | INTRAMUSCULAR | Status: DC | PRN

## 2014-11-28 MED ORDER — PANTOPRAZOLE SODIUM 40 MG IV SOLR
40.0000 mg | Freq: Every day | INTRAVENOUS | Status: DC
  Administered 2014-11-28 – 2014-12-03 (×6): 40 mg via INTRAVENOUS
  Filled 2014-11-28 (×6): qty 40

## 2014-11-28 MED ORDER — HYDROMORPHONE HCL 1 MG/ML IJ SOLN
1.0000 mg | Freq: Once | INTRAMUSCULAR | Status: AC
  Administered 2014-11-28: 1 mg via INTRAVENOUS
  Filled 2014-11-28: qty 1

## 2014-11-28 MED ORDER — SODIUM CHLORIDE 0.9 % IJ SOLN: 3.0000 mL | INTRAMUSCULAR | Status: DC | PRN

## 2014-11-28 MED ORDER — FENTANYL CITRATE 0.05 MG/ML IJ SOLN
INTRAMUSCULAR | Status: AC
  Filled 2014-11-28: qty 5

## 2014-11-28 MED ORDER — ALBUMIN HUMAN 5 % IV SOLN
INTRAVENOUS | Status: DC | PRN
  Administered 2014-11-28: 19:00:00 via INTRAVENOUS

## 2014-11-28 MED ORDER — VANCOMYCIN HCL 1000 MG IV SOLR
1000.0000 mg | INTRAVENOUS | Status: DC | PRN
  Administered 2014-11-28: 1000 mg via INTRAVENOUS

## 2014-11-28 MED ORDER — 0.9 % SODIUM CHLORIDE (POUR BTL) OPTIME
TOPICAL | Status: DC | PRN
  Administered 2014-11-28: 1000 mL

## 2014-11-28 MED ORDER — KCL IN DEXTROSE-NACL 20-5-0.45 MEQ/L-%-% IV SOLN
80.0000 mL/h | INTRAVENOUS | Status: DC
  Administered 2014-11-28: 80 mL/h via INTRAVENOUS
  Filled 2014-11-28: qty 1000

## 2014-11-28 MED ORDER — OXYCODONE-ACETAMINOPHEN 5-325 MG PO TABS
ORAL_TABLET | ORAL | Status: AC
  Filled 2014-11-28: qty 1

## 2014-11-28 MED ORDER — MAGNESIUM CITRATE PO SOLN
1.0000 | Freq: Once | ORAL | Status: AC | PRN
  Filled 2014-11-28: qty 296

## 2014-11-28 MED ORDER — OXYCODONE HCL 5 MG/5ML PO SOLN: 5.0000 mg | Freq: Once | ORAL | Status: DC | PRN

## 2014-11-28 MED ORDER — PHENYLEPHRINE HCL 10 MG/ML IJ SOLN
INTRAMUSCULAR | Status: AC
  Filled 2014-11-28: qty 1

## 2014-11-28 MED ORDER — HYDROMORPHONE HCL 1 MG/ML IJ SOLN
1.0000 mg | INTRAMUSCULAR | Status: DC | PRN
  Administered 2014-11-29 – 2014-12-01 (×4): 1 mg via INTRAMUSCULAR
  Filled 2014-11-28 (×4): qty 1

## 2014-11-28 MED ORDER — PROPOFOL 10 MG/ML IV BOLUS
INTRAVENOUS | Status: DC | PRN
  Administered 2014-11-28: 150 mg via INTRAVENOUS

## 2014-11-28 MED ORDER — ONDANSETRON HCL 4 MG/2ML IJ SOLN
4.0000 mg | INTRAMUSCULAR | Status: DC | PRN
  Administered 2014-12-02 – 2014-12-08 (×22): 4 mg via INTRAVENOUS
  Filled 2014-11-28 (×23): qty 2

## 2014-11-28 MED ORDER — TRAZODONE HCL 100 MG PO TABS
100.0000 mg | ORAL_TABLET | Freq: Every day | ORAL | Status: DC
  Administered 2014-11-28 – 2014-12-07 (×10): 100 mg via ORAL
  Filled 2014-11-28 (×10): qty 1

## 2014-11-28 MED ORDER — LACTATED RINGERS IV SOLN
INTRAVENOUS | Status: DC | PRN
  Administered 2014-11-28: 18:00:00 via INTRAVENOUS

## 2014-11-28 MED ORDER — DULOXETINE HCL 60 MG PO CPEP
60.0000 mg | ORAL_CAPSULE | Freq: Every day | ORAL | Status: DC
  Administered 2014-11-29 – 2014-12-08 (×10): 60 mg via ORAL
  Filled 2014-11-28 (×10): qty 1

## 2014-11-28 MED ORDER — SODIUM CHLORIDE 0.9 % IV SOLN: 250.0000 mL | INTRAVENOUS | Status: DC

## 2014-11-28 MED ORDER — ACETAMINOPHEN 650 MG RE SUPP: 650.0000 mg | RECTAL | Status: DC | PRN

## 2014-11-28 MED ORDER — VANCOMYCIN HCL 10 G IV SOLR
1250.0000 mg | Freq: Two times a day (BID) | INTRAVENOUS | Status: DC
  Administered 2014-11-28 – 2014-12-01 (×6): 1250 mg via INTRAVENOUS
  Filled 2014-11-28 (×7): qty 1250

## 2014-11-28 MED ORDER — HYDROMORPHONE HCL 1 MG/ML IJ SOLN
INTRAMUSCULAR | Status: AC
  Filled 2014-11-28: qty 1

## 2014-11-28 MED ORDER — CYCLOBENZAPRINE HCL 10 MG PO TABS
10.0000 mg | ORAL_TABLET | Freq: Three times a day (TID) | ORAL | Status: DC | PRN
  Administered 2014-11-28 – 2014-12-07 (×8): 10 mg via ORAL
  Filled 2014-11-28 (×8): qty 1

## 2014-11-28 MED ORDER — FENTANYL CITRATE 0.05 MG/ML IJ SOLN
INTRAMUSCULAR | Status: DC | PRN
  Administered 2014-11-28 (×2): 100 ug via INTRAVENOUS
  Administered 2014-11-28: 50 ug via INTRAVENOUS

## 2014-11-28 MED ORDER — ATORVASTATIN CALCIUM 10 MG PO TABS
10.0000 mg | ORAL_TABLET | Freq: Every day | ORAL | Status: DC
  Administered 2014-11-29 – 2014-12-07 (×9): 10 mg via ORAL
  Filled 2014-11-28 (×9): qty 1

## 2014-11-28 MED ORDER — SODIUM CHLORIDE 0.9 % IJ SOLN
3.0000 mL | Freq: Two times a day (BID) | INTRAMUSCULAR | Status: DC
  Administered 2014-11-28 – 2014-12-07 (×10): 3 mL via INTRAVENOUS

## 2014-11-28 MED ORDER — FENTANYL CITRATE 0.05 MG/ML IJ SOLN
100.0000 ug | Freq: Once | INTRAMUSCULAR | Status: AC
  Administered 2014-11-28: 100 ug via INTRAVENOUS
  Filled 2014-11-28: qty 2

## 2014-11-28 SURGICAL SUPPLY — 50 items
BAG DECANTER FOR FLEXI CONT (MISCELLANEOUS) ×9 IMPLANT
BENZOIN TINCTURE PRP APPL 2/3 (GAUZE/BANDAGES/DRESSINGS) ×3 IMPLANT
BLADE CLIPPER SURG (BLADE) IMPLANT
BRUSH SCRUB EZ PLAIN DRY (MISCELLANEOUS) ×3 IMPLANT
CANISTER SUCT 3000ML PPV (MISCELLANEOUS) ×6 IMPLANT
CLEANER TIP ELECTROSURG 2X2 (MISCELLANEOUS) IMPLANT
CLOSURE WOUND 1/2 X4 (GAUZE/BANDAGES/DRESSINGS)
CONT SPEC 4OZ CLIKSEAL STRL BL (MISCELLANEOUS) IMPLANT
DRAPE LAPAROTOMY 100X72X124 (DRAPES) ×3 IMPLANT
DRSG OPSITE POSTOP 4X10 (GAUZE/BANDAGES/DRESSINGS) ×3 IMPLANT
DRSG TELFA 3X8 NADH (GAUZE/BANDAGES/DRESSINGS) IMPLANT
ELECT REM PT RETURN 9FT ADLT (ELECTROSURGICAL) ×3
ELECTRODE REM PT RTRN 9FT ADLT (ELECTROSURGICAL) ×1 IMPLANT
EVACUATOR 1/8 PVC DRAIN (DRAIN) ×3 IMPLANT
GAUZE SPONGE 4X4 12PLY STRL (GAUZE/BANDAGES/DRESSINGS) IMPLANT
GAUZE SPONGE 4X4 16PLY XRAY LF (GAUZE/BANDAGES/DRESSINGS) IMPLANT
GLOVE BIO SURGEON STRL SZ 6.5 (GLOVE) ×4 IMPLANT
GLOVE BIO SURGEONS STRL SZ 6.5 (GLOVE) ×2
GLOVE BIOGEL PI IND STRL 6.5 (GLOVE) ×1 IMPLANT
GLOVE BIOGEL PI INDICATOR 6.5 (GLOVE) ×2
GLOVE ECLIPSE 8.0 STRL XLNG CF (GLOVE) ×3 IMPLANT
GLOVE EXAM NITRILE LRG STRL (GLOVE) IMPLANT
GLOVE EXAM NITRILE XL STR (GLOVE) IMPLANT
GLOVE EXAM NITRILE XS STR PU (GLOVE) IMPLANT
GOWN STRL REUS W/ TWL LRG LVL3 (GOWN DISPOSABLE) ×2 IMPLANT
GOWN STRL REUS W/ TWL XL LVL3 (GOWN DISPOSABLE) IMPLANT
GOWN STRL REUS W/TWL 2XL LVL3 (GOWN DISPOSABLE) IMPLANT
GOWN STRL REUS W/TWL LRG LVL3 (GOWN DISPOSABLE) ×4
GOWN STRL REUS W/TWL XL LVL3 (GOWN DISPOSABLE)
KIT BASIN OR (CUSTOM PROCEDURE TRAY) ×3 IMPLANT
KIT ROOM TURNOVER OR (KITS) ×3 IMPLANT
LIQUID BAND (GAUZE/BANDAGES/DRESSINGS) ×3 IMPLANT
NS IRRIG 1000ML POUR BTL (IV SOLUTION) ×3 IMPLANT
PACK LAMINECTOMY NEURO (CUSTOM PROCEDURE TRAY) ×3 IMPLANT
PAD ARMBOARD 7.5X6 YLW CONV (MISCELLANEOUS) ×9 IMPLANT
SPONGE SURGIFOAM ABS GEL 100 (HEMOSTASIS) ×3 IMPLANT
SPONGE SURGIFOAM ABS GEL SZ50 (HEMOSTASIS) IMPLANT
STAPLER SKIN PROX WIDE 3.9 (STAPLE) IMPLANT
STRIP CLOSURE SKIN 1/2X4 (GAUZE/BANDAGES/DRESSINGS) IMPLANT
SUT VIC AB 0 CT1 18XCR BRD8 (SUTURE) ×2 IMPLANT
SUT VIC AB 0 CT1 8-18 (SUTURE) ×4
SUT VIC AB 2-0 OS6 18 (SUTURE) ×9 IMPLANT
SUT VIC AB 3-0 CP2 18 (SUTURE) ×6 IMPLANT
SWAB COLLECTION DEVICE MRSA (MISCELLANEOUS) ×3 IMPLANT
SYR 20ML ECCENTRIC (SYRINGE) ×3 IMPLANT
TAPE STRIPS DRAPE STRL (GAUZE/BANDAGES/DRESSINGS) ×3 IMPLANT
TOWEL OR 17X24 6PK STRL BLUE (TOWEL DISPOSABLE) ×3 IMPLANT
TOWEL OR 17X26 10 PK STRL BLUE (TOWEL DISPOSABLE) ×3 IMPLANT
TUBE ANAEROBIC SPECIMEN COL (MISCELLANEOUS) ×3 IMPLANT
WATER STERILE IRR 1000ML POUR (IV SOLUTION) ×3 IMPLANT

## 2014-11-28 NOTE — Anesthesia Preprocedure Evaluation (Signed)
Anesthesia Evaluation  Patient identified by MRN, date of birth, ID band Patient awake    Reviewed: Allergy & Precautions, NPO status , Patient's Chart, lab work & pertinent test results  History of Anesthesia Complications (+) history of anesthetic complications  Airway Mallampati: III  TM Distance: <3 FB Neck ROM: Limited    Dental  (+) Missing, Dental Advisory Given,    Pulmonary sleep apnea ,  breath sounds clear to auscultation        Cardiovascular hypertension, Pt. on medications Rhythm:Regular Rate:Normal     Neuro/Psych    GI/Hepatic   Endo/Other  diabetes, Type 2Hx  of Pan hypopituitarism, on replacement meds.  Renal/GU      Musculoskeletal  (+) Arthritis -,   Abdominal   Peds  Hematology   Anesthesia Other Findings   Reproductive/Obstetrics                             Anesthesia Physical  Anesthesia Plan  ASA: II and emergent  Anesthesia Plan: General   Post-op Pain Management:    Induction: Intravenous  Airway Management Planned: Video Laryngoscope Planned and Oral ETT  Additional Equipment: Arterial line  Intra-op Plan:   Post-operative Plan: Possible Post-op intubation/ventilation and Extubation in OR  Informed Consent: I have reviewed the patients History and Physical, chart, labs and discussed the procedure including the risks, benefits and alternatives for the proposed anesthesia with the patient or authorized representative who has indicated his/her understanding and acceptance.   Dental advisory given  Plan Discussed with: CRNA and Surgeon  Anesthesia Plan Comments:         Anesthesia Quick Evaluation

## 2014-11-28 NOTE — Anesthesia Procedure Notes (Signed)
Procedure Name: Intubation Date/Time: 11/28/2014 6:36 PM Performed by: ADAMI, RICHARD Pre-anesthesia Checklist: Patient identified, Timeout performed, Emergency Drugs available, Suction available and Patient being monitored Patient Re-evaluated:Patient Re-evaluated prior to inductionOxygen Delivery Method: Circle system utilized Preoxygenation: Pre-oxygenation with 100% oxygen Intubation Type: IV induction and Rapid sequence Ventilation: Mask ventilation without difficulty Tube type: Oral Tube size: 7.5 mm Number of attempts: 1 Airway Equipment and Method: Video-laryngoscopy and LTA kit utilized Placement Confirmation: ETT inserted through vocal cords under direct vision,  breath sounds checked- equal and bilateral and positive ETCO2 Secured at: 22 cm Tube secured with: Tape Dental Injury: Teeth and Oropharynx as per pre-operative assessment      

## 2014-11-28 NOTE — ED Provider Notes (Signed)
Abnormal MRI results noted.  Dr Gerlene FeeKritzer is at the bedside evaluating the patient.  Linwood DibblesJon Jimmye Wisnieski, MD 11/28/14 1725

## 2014-11-28 NOTE — Progress Notes (Signed)
ANTIBIOTIC CONSULT NOTE - INITIAL  Pharmacy Consult for vancomycin Indication: post op lumbar wound debridement  Allergies  Allergen Reactions  . Accupril [Quinapril Hcl]   . Niacin And Related     Patient Measurements:    Body Weight: 95 kg  Vital Signs: Temp: 97.9 F (36.6 C) (03/29 2134) Temp Source: Oral (03/29 2134) BP: 141/77 mmHg (03/29 2134) Pulse Rate: 77 (03/29 2134) Intake/Output from previous day:   Intake/Output from this shift: Total I/O In: 1300 [I.V.:1050; IV Piggyback:250] Out: 1100 [Urine:1100]  Labs:  Recent Labs  11/28/14 1652  WBC 6.5  HGB 11.0*  PLT 608*  CREATININE 0.79   CrCl cannot be calculated (Unknown ideal weight.). No results for input(s): VANCOTROUGH, VANCOPEAK, VANCORANDOM, GENTTROUGH, GENTPEAK, GENTRANDOM, TOBRATROUGH, TOBRAPEAK, TOBRARND, AMIKACINPEAK, AMIKACINTROU, AMIKACIN in the last 72 hours.   Microbiology: Recent Results (from the past 720 hour(s))  Gram stain     Status: None   Collection Time: 11/28/14  7:02 PM  Result Value Ref Range Status   Specimen Description WOUND  Final   Special Requests LUMBAR WOUND SPECIMEN A  Final   Gram Stain   Final    ABUNDANT WBC PRESENT,BOTH PMN AND MONONUCLEAR NO ORGANISMS SEEN    Report Status 11/28/2014 FINAL  Final    Medical History: Past Medical History  Diagnosis Date  . Hyperlipidemia   . Hypertension   . Diabetes insipidus   . Anxiety   . Sleep apnea     mild no cpap recommended  . Arthritis   . Fibromyalgia   . History of kidney stones   . Frequency of urination   . TMJ (temporomandibular joint disorder)   . Diabetes mellitus without complication     borderline,no meds A1C 5.7 on 10-20-2014  . Complication of anesthesia     hypotension,tmj,DIFFICULT TILT HEAD, lacking pituitary gland, pt. concerned about temperature regulation     Medications:  Prescriptions prior to admission  Medication Sig Dispense Refill Last Dose  . acetaminophen-codeine (TYLENOL #3)  300-30 MG per tablet Take 1 tablet by mouth every 4 (four) hours as needed for pain (can take twice a day).   11/27/2014 at Unknown time  . atorvastatin (LIPITOR) 10 MG tablet Take 10 mg by mouth daily.   11/27/2014 at Unknown time  . calcium carbonate (OS-CAL) 600 MG TABS tablet Take 600 mg by mouth 2 (two) times daily with a meal.   11/28/2014 at Unknown time  . Cholecalciferol (VITAMIN D) 2000 UNITS tablet Take 2,000 Units by mouth daily.   11/28/2014 at Unknown time  . cyclobenzaprine (FLEXERIL) 10 MG tablet Take 10 mg by mouth 3 (three) times daily as needed for muscle spasms.   11/28/2014 at Unknown time  . desmopressin (DDAVP) 0.2 MG tablet TAKE 2 TABLETS (0.4MG ) 3   TIMES DAILY 540 tablet 3 11/28/2014 at Unknown time  . DULoxetine (CYMBALTA) 60 MG capsule Take 60 mg by mouth daily.   11/28/2014 at Unknown time  . Glucos-MSM-C-Mn-Ginger-Willow (GLUCOSAMINE MSM COMPLEX PO) Take 1 tablet by mouth daily.   11/27/2014 at Unknown time  . hydrocortisone (CORTEF) 5 MG tablet 2 tabs in the AM and 1 tab at 5 pm 270 tablet 3 11/28/2014 at Unknown time  . levothyroxine (SYNTHROID, LEVOTHROID) 150 MCG tablet Take 1 tablet (150 mcg total) by mouth daily before breakfast. 90 tablet 1 11/28/2014 at Unknown time  . lidocaine (LIDODERM) 5 % Place 1 patch onto the skin daily. Remove & Discard patch within 12 hours or as  directed by MD   11/27/2014 at Unknown time  . losartan (COZAAR) 100 MG tablet Take 100 mg by mouth daily.   11/28/2014 at Unknown time  . Multiple Vitamin (MULTIVITAMIN WITH MINERALS) TABS tablet Take 1 tablet by mouth daily.   11/27/2014 at Unknown time  . NORDITROPIN FLEXPRO 5 MG/1.5ML SOLN DIAL DOSE OF 0.35MG  ON PEN DEVICE AND INJECT SUBCUTANEOUSLY 7 DAYS A WEEK. STORE UNDER REFRIGERATION. EXPIRES 28 DAYS AFTER INITIAL USE. 6 mL 7 11/27/2014 at Unknown time  . oxyCODONE-acetaminophen (PERCOCET/ROXICET) 5-325 MG per tablet Take 1-2 tablets by mouth every 6 (six) hours as needed for moderate pain or severe  pain.   11/28/2014 at Unknown time  . psyllium (METAMUCIL SMOOTH TEXTURE) 28 % packet Take 1 packet by mouth 2 (two) times daily.   11/27/2014 at Unknown time  . tamsulosin (FLOMAX) 0.4 MG CAPS Take 0.4 mg by mouth daily after supper.    11/27/2014 at Unknown time  . Testosterone (AXIRON) 30 MG/ACT SOLN Place 30 mg onto the skin 4 (four) times daily. (Patient taking differently: Place 30 mg onto the skin daily. ) 270 mL 1 11/27/2014 at Unknown time  . traZODone (DESYREL) 100 MG tablet Take 100 mg by mouth at bedtime.   11/27/2014 at Unknown time  . vitamin C (ASCORBIC ACID) 500 MG tablet Take 500 mg by mouth 2 (two) times daily.   11/27/2014 at Unknown time  . Somatropin 15 MG/1.5ML SOLN 0.2 mg injection daily. (Patient not taking: Reported on 11/28/2014) 4.5 mL 3 Not Taking at Unknown time   Assessment: 69 yo man to receive vancomycin postop lumbar wound debridement.  His CrCl ~90 ml/min.    Goal of Therapy:  Vancomycin trough level 10-15 mcg/ml  Plan:  Vancomycin 1250 mg IV q12 hours F/u renal function, cultures and clinical course  Thanks for allowing pharmacy to be a part of this patient's care.  Talbert Cage, PharmD Clinical Pharmacist, [phone] 11/28/2014,9:47 PM

## 2014-11-28 NOTE — Transfer of Care (Signed)
Immediate Anesthesia Transfer of Care Note  Patient: Troy Collier  Procedure(s) Performed: Procedure(s): LUMBAR WOUND DEBRIDEMENT (N/A)  Patient Location: PACU  Anesthesia Type:General  Level of Consciousness: awake, alert  and oriented  Airway & Oxygen Therapy: Patient Spontanous Breathing and Patient connected to nasal cannula oxygen  Post-op Assessment: Report given to RN and Post -op Vital signs reviewed and stable  Post vital signs: Reviewed and stable  Last Vitals:  Filed Vitals:   11/28/14 1633  BP: 118/65  Pulse: 90  Temp: 37.1 C  Resp: 18    Complications: No apparent anesthesia complications

## 2014-11-28 NOTE — Op Note (Signed)
Preop diagnosis: Postoperative epidural fluid collection Postop diagnosis: Lumbar epidural abscess Procedure: Exploration of lumbar wound with evacuation of epidural abscess Surgeon: Swayzee Wadley  After being placed the prone position the patient's back was prepped and draped in the usual sterile fashion. Previous lumbar incision was opened up. We got down through the fascia a huge [place] collection under amazing pressure was encountered and evacuated with suction. When the fluid was no longer coming out under pressure cultures were taken and sent for stat Gram stain and routine cultures. We then opened up the incision so we could evaluate the dead space in the epidural space. Large amounts of purulent material were still encountered and evacuated. We then irrigated copiously with antibiotic and saline irrigation. We controlled any bleeding with unipolar coagulation. We then left to epidural drain to the epidural space and brought out through separate stab wound incisions. The was then closed in multiple layers of Vicryl on the muscle fascia subcutaneous and subcuticular tissues. Dermabond and Steri-Strips were placed on the skin. Sterile dressing was then applied and the patient was extubated and taken to recovery room in stable condition.

## 2014-11-28 NOTE — ED Notes (Signed)
Patient had a l2 to s1 fusions last month on 25th patient complains of 8/10 sharp pain. Patient was standing and slid down from standing and was unable to get up. Vitals 132/86 HR 90 RR19 98% RA.

## 2014-11-28 NOTE — H&P (Signed)
Troy DonnaJohn Collier is an 69 y.o. male.   Chief Complaint: Severe back pain HPI: 69 yo male who had multi level fusion about 5 weeks ago. He was doing well until 4 days ago when he developed progressive back pain and decreasing ability to ambulate. He came to ED today where he had an MRI of the back that showed a huge fluid collection c/w either an abscess or hematoma with marked neural compression. Consult requested, and patient now admitted for surgery and evacuation of collection.  Past Medical History  Diagnosis Date  . Hyperlipidemia   . Hypertension   . Diabetes insipidus   . Anxiety   . Sleep apnea     mild no cpap recommended  . Arthritis   . Fibromyalgia   . History of kidney stones   . Frequency of urination   . TMJ (temporomandibular joint disorder)   . Diabetes mellitus without complication     borderline,no meds A1C 5.7 on 10-20-2014  . Complication of anesthesia     hypotension,tmj,DIFFICULT TILT HEAD, lacking pituitary gland, pt. concerned about temperature regulation     Past Surgical History  Procedure Laterality Date  . Spine surgery    . Rotator cuff repair    . Posterior cervical fusion/foraminotomy N/A 06/07/2013    Procedure: Exploration of posterior Cervical six-seven Fusion with Cervical seven thoracic-one posterior cervical decompression/fusion;  Surgeon: Maeola HarmanJoseph Stern, MD;  Location: [facility];  Service: Neurosurgery;  Laterality: N/A;  . Ulnar nerve transposition Right 06/07/2013    Procedure: ULNAR NERVE DECOMPRESSION/TRANSPOSITION;  Surgeon: Maeola HarmanJoseph Stern, MD;  Location: [facility];  Service: Neurosurgery;  Laterality: Right;  . Brain surgery      2000   mc, removed pituitary gland  . Posterior lumbar fusion 4 level N/A 10/26/2014    Procedure: Lumbar two-three, Lumba three-four Posterior Lateral Arthrodesis, Lumbar four-five Thoracolumbar Interbody Fusion, Lumbar five-Sacral one Posterior lumbar interbody fusion;  Surgeon: Maeola HarmanJoseph Stern, MD;  Location: [facility];  Service: Neurosurgery;  Laterality: N/A;  L2-3 L3-4 L4-5 L5-S1 Posterior lumbar interbody fusion    Family History  Problem Relation Age of Onset  . Hypertension Other   . Obesity Other   . Heart attack Other    Social History:  reports that he has never smoked. He has never used smokeless tobacco. He reports that he drinks alcohol. He reports that he does not use illicit drugs.  Allergies:  Allergies  Allergen Reactions  . Accupril [Quinapril Hcl]   . Niacin And Related      (Not in a hospital admission)  Results for orders placed or performed during the hospital encounter of 11/28/14 (from the past 48 hour(s))  CBC with Differential     Status: Abnormal   Collection Time: 11/28/14  4:52 PM  Result Value Ref Range   WBC 6.5 4.0 - 10.5 K/uL   RBC 3.66 (L) 4.22 - 5.81 MIL/uL   Hemoglobin 11.0 (L) 13.0 - 17.0 g/dL   HCT 40.933.5 (L) 81.139.0 - 91.452.0 %   MCV 91.5 78.0 - 100.0 fL   MCH 30.1 26.0 - 34.0 pg   MCHC 32.8 30.0 - 36.0 g/dL   RDW 78.212.8 95.611.5 - 21.315.5 %   Platelets 608 (H) 150 - 400 K/uL   Neutrophils Relative % 70 43 - 77 %   Neutro Abs 4.5 1.7 - 7.7 K/uL   Lymphocytes Relative 22 12 - 46 %   Lymphs Abs 1.4 0.7 - 4.0 K/uL   Monocytes Relative  7 3 - 12 %   Monocytes Absolute 0.5 0.1 - 1.0 K/uL   Eosinophils Relative 0 0 - 5 %   Eosinophils Absolute 0.0 0.0 - 0.7 K/uL   Basophils Relative 1 0 - 1 %   Basophils Absolute 0.0 0.0 - 0.1 K/uL   Mr Lumbar Spine Wo Contrast  11/28/2014   CLINICAL DATA:  Prior back operation in February. Increasing back pain. Lumbago/low back pain with radiations down the LEFT leg. Intercedent inpatient rehabilitation stay following operation.  EXAM: MRI LUMBAR SPINE WITHOUT CONTRAST  TECHNIQUE: Multiplanar, multisequence MR imaging of the lumbar spine was performed. No intravenous contrast was administered.  COMPARISON:  MRI 07/22/2014.  FINDINGS: Segmentation: Numbering used on prior exam preserved.  Alignment: L2 through S1 posterior rod  and screw fixation. There is new grade I anterolisthesis of L4 on L5 measuring 2 mm. Anterolisthesis of L5 on S1 appears similar. 3 mm retrolisthesis of L2 on L3. L3-L4 alignment is within normal limits. Dextroconvex curve is present with the apex at L2.  Vertebrae: Benign vertebral body hemangiomata present at T12 and L1. No compression fracture. Following discectomy and back operation. Postsurgical endplate changes are present from L2-L3 through L4-L5. There is more edema at L5-S1 than at the other levels. Although this can be seen in the postoperative setting, early infection cannot be excluded at L5-S1. There is also paravertebral phlegmon around L5-S1, increasing the concern for infection.  Conus medullaris: normal at L1-L2.  Paraspinal tissues: Diffuse edema in the posterior paraspinal musculature. There is a large fluid collection that surrounds the PLIF hardware from L2 through S1. This collection measures 9.3 cm craniocaudal. Maximal transverse dimension is 9.4 cm. AP this measures about 6 cm. There is mass effect on the posterior thecal sac compressing the nerves of the cauda equina at L4-L5.  Disc levels:  Disc Signal: Normal.  T10-T11 through L1-L2 discs appear similar to the preoperative examination. No significant stenosis is present at these levels. Disc desiccation and bulging.  L2-L3: Mild central stenosis secondary to residual annulus. Bilateral L2 laminotomies. Moderate LEFT foraminal stenosis appears present although this is poorly evaluated due to artifact from hardware.  L3-L4: L3 laminectomy. Mild to moderate central stenosis predominantly due to mass effect from fluid collection in the laminectomy bed. Neural foramina appear patent. The disc remains present, with shallow posterior disc bulging.  L4-L5: L4 laminectomy. Severe central stenosis predominately due to mass effect from the posterior laminectomy fluid collection. There is complete effacement of contrast in the thecal sac at the level  of the disc. Mild bilateral foraminal stenosis is present.  L5-S1: L5 laminectomy. The central canal is difficult to define based on the the mass effect from the fluid collection an postoperative debris in and around the thecal sac. Severe central stenosis is present with compression of the nerve roots due to mass effect from the posterior fluid collection. The LEFT S1 pedicle screw appears to traverse the LEFT lateral recess, approaching if not contacting the descending LEFT S1 nerve.  No definite anterior epidural abscess is identified however in the absence of contrast, this is poorly evaluated.  IMPRESSION: 1. L2 through S1 posterior lumbar interbody fusion. Increased endplate edema at Z6-X0 relative to the other postoperative levels. Paravertebral phlegmon around L5-S1. 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 Correlation with laboratory studies recommended. 2. Critical Value/emergent results were called by telephone at the time of interpretation on 11/28/2014 at 4:44 pm to Dr.  KNAPP , who verbally acknowledged these results. 3. Large fluid collection in the laminectomy bed extending from L2 through S1, measuring over 9 cm transverse and craniocaudal. This produces mass effect on the thecal sac and compression of the thecal sac at L4-L5 and L5-S1. Differential considerations for the fluid collection are abscess, spinal leak (pseudomeningocele) and postoperative hematoma. A combination of these is probably present based on signal characteristics.   Electronically Signed   By: Andreas Newport M.D.   On: 11/28/2014 16:59    Review of systems not obtained due to patient factors.  Blood pressure 118/65, pulse 90, temperature 98.7 F (37.1 C), temperature source Oral, resp. rate 18, SpO2 92 %.  awake, alert, conversant. Strength appears to be intact, but he has trouble using proximal muscles due to severe pain. Sensation is  intact. Assessment/Plan Post op fluid collection with severe stenosis. Plan is for surgical evacuation.  Reinaldo Meeker, MD 11/28/2014, 5:35 PM

## 2014-11-28 NOTE — ED Notes (Signed)
Patient off the floor for MRI.

## 2014-11-28 NOTE — Anesthesia Postprocedure Evaluation (Signed)
  Anesthesia Post-op Note  Patient: Troy Collier  Procedure(s) Performed: Procedure(s): LUMBAR WOUND DEBRIDEMENT (N/A)  Patient Location: PACU  Anesthesia Type:General  Level of Consciousness: awake and alert   Airway and Oxygen Therapy: Patient Spontanous Breathing  Post-op Pain: mild  Post-op Assessment: Post-op Vital signs reviewed  Post-op Vital Signs: stable  Last Vitals:  Filed Vitals:   11/28/14 2134  BP: 141/77  Pulse: 77  Temp: 36.6 C  Resp: 18    Complications: No apparent anesthesia complications

## 2014-11-28 NOTE — OR Nursing (Signed)
[employer] notified pt needs aspen lumbar fusion brace

## 2014-11-28 NOTE — ED Provider Notes (Signed)
CSN: [id number]     Arrival date & time 11/28/14  1410 History   First MD Initiated Contact with Patient 11/28/14 1415     Chief Complaint  Patient presents with  . Back Pain     HPI Patient had a l2 to s1 fusions last month on 25th patient complains of 8/10 sharp pain. Patient was standing and slid down from standing and was unable to get up.  Patient denies any urinary incontinence.  Patient denies any fecal incontinence.  Patient denies any new numbness.  Pain is worsened over the last 48 hours to the point take unable to get out of bed. Past Medical History  Diagnosis Date  . Hyperlipidemia   . Hypertension   . Diabetes insipidus   . Anxiety   . Sleep apnea     mild no cpap recommended  . Arthritis   . Fibromyalgia   . History of kidney stones   . Frequency of urination   . TMJ (temporomandibular joint disorder)   . Diabetes mellitus without complication     borderline,no meds A1C 5.7 on 10-20-2014  . Complication of anesthesia     hypotension,tmj,DIFFICULT TILT HEAD, lacking pituitary gland, pt. concerned about temperature regulation    Past Surgical History  Procedure Laterality Date  . Spine surgery    . Rotator cuff repair    . Posterior cervical fusion/foraminotomy N/A 06/07/2013    Procedure: Exploration of posterior Cervical six-seven Fusion with Cervical seven thoracic-one posterior cervical decompression/fusion;  Surgeon: Erline Levine, MD;  Location: [facility];  Service: Neurosurgery;  Laterality: N/A;  . Ulnar nerve transposition Right 06/07/2013    Procedure: ULNAR NERVE DECOMPRESSION/TRANSPOSITION;  Surgeon: Erline Levine, MD;  Location: [facility];  Service: Neurosurgery;  Laterality: Right;  . Brain surgery      2000   mc, removed pituitary gland  . Posterior lumbar fusion 4 level N/A 10/26/2014    Procedure: Lumbar two-three, Lumba three-four Posterior Lateral Arthrodesis, Lumbar four-five Thoracolumbar Interbody Fusion, Lumbar five-Sacral one Posterior  lumbar interbody fusion;  Surgeon: Erline Levine, MD;  Location: [facility];  Service: Neurosurgery;  Laterality: N/A;  L2-3 L3-4 L4-5 L5-S1 Posterior lumbar interbody fusion  . Lumbar wound debridement N/A 11/28/2014    Procedure: LUMBAR WOUND DEBRIDEMENT;  Surgeon: Karie Chimera, MD;  Location: [facility];  Service: Neurosurgery;  Laterality: N/A;   Family History  Problem Relation Age of Onset  . Hypertension Other   . Obesity Other   . Heart attack Other    History  Substance Use Topics  . Smoking status: Never Smoker   . Smokeless tobacco: Never Used  . Alcohol Use: Yes     Comment: occasionally    Review of Systems  All other systems reviewed and are negative  Allergies  Accupril and Niacin and related  Home Medications   Prior to Admission medications   Medication Sig Start Date End Date Taking? Authorizing Provider  acetaminophen-codeine (TYLENOL #3) 300-30 MG per tablet Take 1 tablet by mouth every 4 (four) hours as needed for pain (can take twice a day).   Yes Historical Provider, MD  atorvastatin (LIPITOR) 10 MG tablet Take 10 mg by mouth daily.   Yes Historical Provider, MD  calcium carbonate (OS-CAL) 600 MG TABS tablet Take 600 mg by mouth 2 (two) times daily with a meal.   Yes Historical Provider, MD  Cholecalciferol (VITAMIN D) 2000 UNITS tablet Take 2,000 Units by mouth daily.   Yes Historical Provider,  MD  cyclobenzaprine (FLEXERIL) 10 MG tablet Take 10 mg by mouth 3 (three) times daily as needed for muscle spasms.   Yes Historical Provider, MD  desmopressin (DDAVP) 0.2 MG tablet TAKE 2 TABLETS (0.4MG) 3   TIMES DAILY 04/17/14  Yes Elayne Snare, MD  DULoxetine (CYMBALTA) 60 MG capsule Take 60 mg by mouth daily.   Yes Historical Provider, MD  Glucos-MSM-C-Mn-Ginger-Willow (GLUCOSAMINE MSM COMPLEX PO) Take 1 tablet by mouth daily.   Yes Historical Provider, MD  hydrocortisone (CORTEF) 5 MG tablet 2 tabs in the AM and 1 tab at 5 pm 05/12/14  Yes Elayne Snare, MD    levothyroxine (SYNTHROID, LEVOTHROID) 150 MCG tablet Take 1 tablet (150 mcg total) by mouth daily before breakfast. 05/12/14  Yes Elayne Snare, MD  lidocaine (LIDODERM) 5 % Place 1 patch onto the skin daily. Remove & Discard patch within 12 hours or as directed by MD   Yes Historical Provider, MD  losartan (COZAAR) 100 MG tablet Take 100 mg by mouth daily.   Yes Historical Provider, MD  Multiple Vitamin (MULTIVITAMIN WITH MINERALS) TABS tablet Take 1 tablet by mouth daily.   Yes Historical Provider, MD  NORDITROPIN FLEXPRO 5 MG/1.5ML SOLN DIAL DOSE OF 0.35MG ON PEN DEVICE AND INJECT SUBCUTANEOUSLY 7 DAYS A WEEK. STORE UNDER REFRIGERATION. EXPIRES 28 DAYS AFTER INITIAL USE. 11/24/14  Yes Elayne Snare, MD  oxyCODONE-acetaminophen (PERCOCET/ROXICET) 5-325 MG per tablet Take 1-2 tablets by mouth every 6 (six) hours as needed for moderate pain or severe pain.   Yes Historical Provider, MD  psyllium (METAMUCIL SMOOTH TEXTURE) 28 % packet Take 1 packet by mouth 2 (two) times daily.   Yes Historical Provider, MD  tamsulosin (FLOMAX) 0.4 MG CAPS Take 0.4 mg by mouth daily after supper.    Yes Historical Provider, MD  Testosterone (AXIRON) 30 MG/ACT SOLN Place 30 mg onto the skin 4 (four) times daily. Patient taking differently: Place 30 mg onto the skin daily.  08/30/14  Yes Elayne Snare, MD  traZODone (DESYREL) 100 MG tablet Take 100 mg by mouth at bedtime.   Yes Historical Provider, MD  vitamin C (ASCORBIC ACID) 500 MG tablet Take 500 mg by mouth 2 (two) times daily.   Yes Historical Provider, MD  Somatropin 15 MG/1.5ML SOLN 0.2 mg injection daily. Patient not taking: Reported on 11/28/2014 11/16/14   Elayne Snare, MD   BP 129/84 mmHg  Pulse 86  Temp(Src) 97.9 F (36.6 C) (Oral)  Resp 20  Ht _0  (1.727 m)  Wt 193 lb 2 oz (87.6 kg)  BMI 29.37 kg/m2  SpO2 95% Physical Exam  Constitutional: He is oriented to person, place, and time. He appears well-developed and well-nourished. No distress.  HENT:  Head:  Normocephalic and atraumatic.  Eyes: Pupils are equal, round, and reactive to light.  Neck: Normal range of motion.  Cardiovascular: Normal rate and intact distal pulses.   Pulmonary/Chest: No respiratory distress.  Abdominal: Normal appearance. He exhibits no distension. There is no tenderness. There is no rebound.  Musculoskeletal:       Lumbar back: He exhibits decreased range of motion and tenderness.       Back:  Neurological: He is alert and oriented to person, place, and time. No cranial nerve deficit or sensory deficit. GCS eye subscore is 4. GCS verbal subscore is 5. GCS motor subscore is 6.  Skin: Skin is warm and dry. No rash noted.  Psychiatric: He has a normal mood and affect. His behavior is normal.  Nursing note and vitals reviewed.   ED Course  Procedures (including critical care time) Medications  vancomycin (VANCOCIN) 1 GM/200ML IVPB (not administered)  HYDROmorphone (DILAUDID) 1 MG/ML injection (not administered)  hydrocortisone (CORTEF) tablet 5 mg (5 mg Oral Given 12/02/14 1024)  levothyroxine (SYNTHROID, LEVOTHROID) tablet 150 mcg (150 mcg Oral Given 12/02/14 1023)  desmopressin (DDAVP) tablet 0.2 mg (0.2 mg Oral Given 12/02/14 1023)  cyclobenzaprine (FLEXERIL) tablet 10 mg (10 mg Oral Given 12/02/14 1231)  atorvastatin (LIPITOR) tablet 10 mg (10 mg Oral Given 12/01/14 1717)  DULoxetine (CYMBALTA) DR capsule 60 mg (60 mg Oral Given 12/02/14 1024)  losartan (COZAAR) tablet 100 mg (100 mg Oral Given 12/02/14 1023)  tamsulosin (FLOMAX) capsule 0.4 mg (0.4 mg Oral Given 12/01/14 1717)  traZODone (DESYREL) tablet 100 mg (100 mg Oral Given 12/01/14 2108)  dextrose 5 % and 0.45 % NaCl with KCl 20 mEq/L infusion (80 mL/hr Intravenous New Bag/Given 11/28/14 2321)  sodium chloride 0.9 % injection 3 mL (3 mLs Intravenous Not Given 12/02/14 1000)  sodium chloride 0.9 % injection 3 mL (not administered)  0.9 %  sodium chloride infusion (not administered)  ondansetron (ZOFRAN) injection 4 mg (4  mg Intravenous Given 12/02/14 1152)  menthol-cetylpyridinium (CEPACOL) lozenge 3 mg (not administered)    Or  phenol (CHLORASEPTIC) mouth spray 1 spray (not administered)  senna-docusate (Senokot-S) tablet 1 tablet (not administered)  magnesium citrate solution 1 Bottle (not administered)  pantoprazole (PROTONIX) injection 40 mg (40 mg Intravenous Given 12/01/14 2108)  cyclobenzaprine (FLEXERIL) 10 MG tablet (  Not Given 11/28/14 2323)  oxyCODONE-acetaminophen (PERCOCET/ROXICET) 5-325 MG per tablet (not administered)  HYDROmorphone (DILAUDID) 1 MG/ML injection (not administered)  cefTRIAXone (ROCEPHIN) 1 g in dextrose 5 % 50 mL IVPB - Premix (1 g Intravenous Given 12/02/14 1026)  docusate sodium (COLACE) capsule 100 mg (100 mg Oral Given 12/02/14 1023)  rifampin (RIFADIN) capsule 300 mg (300 mg Oral Given 12/02/14 1024)  vancomycin (VANCOCIN) 1,500 mg in sodium chloride 0.9 % 500 mL IVPB (1,500 mg Intravenous Given 12/02/14 1026)  sodium chloride 0.9 % injection 10-40 mL (not administered)  oxyCODONE (Oxy IR/ROXICODONE) immediate release tablet 15 mg (15 mg Oral Given 12/02/14 1149)  acetaminophen (TYLENOL) tablet 650 mg (not administered)    Or  acetaminophen (TYLENOL) suppository 650 mg (not administered)  polyethylene glycol (MIRALAX / GLYCOLAX) packet 17 g (17 g Oral Given 12/02/14 1023)  bisacodyl (DULCOLAX) suppository 10 mg (not administered)  fentaNYL (SUBLIMAZE) injection 100 mcg (100 mcg Intravenous Given 11/28/14 1516)  ondansetron (ZOFRAN) injection 4 mg (4 mg Intravenous Given 11/28/14 1516)  HYDROmorphone (DILAUDID) injection 1 mg (1 mg Intravenous Given 11/28/14 1647)  dexamethasone (DECADRON) injection 10 mg (10 mg Intravenous Given 11/28/14 1748)    Labs Review Labs Reviewed  SEDIMENTATION RATE - Abnormal; Notable for the following:    Sed Rate 86 (*)    All other components within normal limits  CBC WITH DIFFERENTIAL/PLATELET - Abnormal; Notable for the following:    RBC 3.66 (*)     Hemoglobin 11.0 (*)    HCT 33.5 (*)    Platelets 608 (*)    All other components within normal limits  GLUCOSE, CAPILLARY - Abnormal; Notable for the following:    Glucose-Capillary 109 (*)    All other components within normal limits  BASIC METABOLIC PANEL - Abnormal; Notable for the following:    Sodium 133 (*)    Anion gap 4 (*)    All other components within normal limits  ANAEROBIC CULTURE  GRAM STAIN  WOUND CULTURE  BASIC METABOLIC PANEL  VANCOMYCIN, TROUGH    Imaging Review No results found.  12/01/14 08:58:51 4 (L) REPEATED TO VERIFY             Glucose, capillary (Final result)Abnormal Component (Lab Inquiry)    Collection Time Result Time Glucose-Capillary   11/28/14 20:13:00 11/28/14 21:06:24 109 (H)             Anaerobic culture (Preliminary result) Component (Lab Inquiry)    Collection Time Result Time Specimen Description Special Requests Gram Stain Culture Report Status   11/28/14 19:02:00 11/29/14 12:23:43 WOUND LUMBAR SPECIMEN A ABUNDANT WBC PRESENT,BOTH PMN AND MONONUCLEAR  NO SQUAMOUS EPITHELIAL CELLS SEEN  NO ORGANISMS SEEN  Performed at [facility]   NO ANAEROBES ISOLATED; CULTURE IN PROGRESS FOR 5 DAYS  Performed at [facility]   PENDING             Gram stain (Final result) Component (Lab Inquiry)    Collection Time Result Time Specimen Description Special Requests Gram Stain Report Status   11/28/14 19:02:00 11/28/14 21:26:08 WOUND LUMBAR WOUND SPECIMEN A ABUNDANT WBC PRESENT,BOTH PMN AND MONONUCLEAR  NO ORGANISMS SEEN   11/28/2014 FINAL             Wound culture (Final result) Component (Lab Inquiry)    Collection Time Result Time Specimen Description Special Requests Gram Stain Culture Report Status   11/28/14 19:02:00 12/01/14 10:24:03 WOUND LUMBAR WOUND SPECIMEN A ABUNDANT WBC PRESENT,BOTH PMN AND MONONUCLEAR  NO SQUAMOUS EPITHELIAL CELLS SEEN  NO ORGANISMS SEEN  Performed at  [facility]   NO GROWTH 2 DAYS  Performed at [facility]   12/01/2014 FINAL             Sedimentation rate (Final result)Abnormal Component (Lab Inquiry)    Collection Time Result Time Sed Rate   11/28/14 16:52:00 11/28/14 18:27:30 86 (H)             CBC with Differential (Final result)Abnormal Component (Lab Inquiry)    Collection Time Result Time WBC RBC HGB HCT MCV   11/28/14 16:52:00 11/28/14 17:28:23 6.5 3.66 (L) 11.0 (L) 33.5 (L) 91.5      Collection Time Result Time MCH MCHC RDW PLT NEUTRO PCT   11/28/14 16:52:00 11/28/14 17:28:23 30.1 32.8 12.8 608 (H) 70      Collection Time Result Time AB NEUTRO LYMPHO PCT AB LYM MONO PCT MONO ABS   11/28/14 16:52:00 11/28/14 17:28:23 4.5 22 1.4 7 0.5      Collection Time Result Time EOS PCT EOSINO ABS BASOS PCT BASOS ABS   11/28/14 16:52:00 11/28/14 17:28:23 0 0.0 1 0.0             Basic metabolic panel (Final result) Component (Lab Inquiry)    Collection Time Result Time NA K CL CO2 GLUCOSE   11/28/14 16:52:00 11/28/14 17:51:05 137 4.4 99 27 97      Collection Time Result Time BUN Creatinine, Ser CALCIUM GFR calc non Af Amer GFR calc Af Amer   11/28/14 16:52:00 11/28/14 17:51:05 15 0.79 9.3 >90  >90    (Comment)    (NOTE)  The eGFR has been calculated using the CKD EPI equation.  This calculation has not been validated in all clinical situations.  eGFR's persistently <90 mL/min signify possible Chronic Kidney  Disease.            Collection Time Result Time Anion gap  11/28/14 16:52:00 11/28/14 17:51:05 11           Imaging Results       MR Lumbar Spine Wo Contrast (Final result) Result time: 11/28/14 16:59:59   Final result by Rad Results In Interface (11/28/14 16:59:59)   Narrative:   CLINICAL DATA: Prior back operation in February. Increasing back pain. Lumbago/low back pain with radiations down the LEFT leg. Intercedent inpatient  rehabilitation stay following operation.  EXAM: MRI LUMBAR SPINE WITHOUT CONTRAST  TECHNIQUE: Multiplanar, multisequence MR imaging of the lumbar spine was performed. No intravenous contrast was administered.  COMPARISON: MRI 07/22/2014.  FINDINGS: Segmentation: Numbering used on prior exam preserved.  Alignment: L2 through S1 posterior rod and screw fixation. There is new grade I anterolisthesis of L4 on L5 measuring 2 mm. Anterolisthesis of L5 on S1 appears similar. 3 mm retrolisthesis of L2 on L3. L3-L4 alignment is within normal limits. Dextroconvex curve is present with the apex at L2.  Vertebrae: Benign vertebral body hemangiomata present at T12 and L1. No compression fracture. Following discectomy and back operation. Postsurgical endplate changes are present from L2-L3 through L4-L5. There is more edema at L5-S1 than at the other levels. Although this can be seen in the postoperative setting, early infection cannot be excluded at L5-S1. There is also paravertebral phlegmon around L5-S1, increasing the concern for infection.  Conus medullaris: normal at L1-L2.  Paraspinal tissues: Diffuse edema in the posterior paraspinal musculature. There is a large fluid collection that surrounds the PLIF hardware from L2 through S1. This collection measures 9.3 cm craniocaudal. Maximal transverse dimension is 9.4 cm. AP this measures about 6 cm. There is mass effect on the posterior thecal sac compressing the nerves of the cauda equina at L4-L5.  Disc levels:  Disc Signal: Normal.  T10-T11 through L1-L2 discs appear similar to the preoperative examination. No significant stenosis is present at these levels. Disc desiccation and bulging.  L2-L3: Mild central stenosis secondary to residual annulus. Bilateral L2 laminotomies. Moderate LEFT foraminal stenosis appears present although this is poorly evaluated due to artifact from hardware.  L3-L4: L3 laminectomy. Mild to  moderate central stenosis predominantly due to mass effect from fluid collection in the laminectomy bed. Neural foramina appear patent. The disc remains present, with shallow posterior disc bulging.  L4-L5: L4 laminectomy. Severe central stenosis predominately due to mass effect from the posterior laminectomy fluid collection. There is complete effacement of contrast in the thecal sac at the level of the disc. Mild bilateral foraminal stenosis is present.  L5-S1: L5 laminectomy. The central canal is difficult to define based on the the mass effect from the fluid collection an postoperative debris in and around the thecal sac. Severe central stenosis is present with compression of the nerve roots due to mass effect from the posterior fluid collection. The LEFT S1 pedicle screw appears to traverse the LEFT lateral recess, approaching if not contacting the descending LEFT S1 nerve.  No definite anterior epidural abscess is identified however in the absence of contrast, this is poorly evaluated.  IMPRESSION: 1. L2 through S1 posterior lumbar interbody fusion. Increased endplate edema at H4-L9 relative to the other postoperative levels. Paravertebral phlegmon around L5-S1. 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 Correlation with laboratory studies recommended. 2. Critical Value/emergent results were called by telephone at the time of interpretation on 11/28/2014 at 4:44 pm to Dr. Tomi Bamberger , who verbally acknowledged these results. 3. Large fluid collection in the  laminectomy bed extending from L2 through S1, measuring over 9 cm transverse and craniocaudal. This produces mass effect on the thecal sac and compression of the thecal sac at L4-L5 and L5-S1. Differential considerations for the fluid collection are abscess, spinal leak (pseudomeningocele) and postoperative hematoma. A combination of these is probably  present based on signal characteristics.   Electronically Signed By: Dereck Ligas M.D. On: 11/28/2014 16:59     I discussed the patient with Dr. Melven Sartorius office.  They will have Dr. Hal Neer review the MRI and call back with follow-up instructions and checkup. MDM   Final diagnoses:  Pain  Phlegmon        Leonard Schwartz, MD 12/02/14 [phone]

## 2014-11-29 ENCOUNTER — Encounter (HOSPITAL_COMMUNITY): Payer: Self-pay | Admitting: Neurosurgery

## 2014-11-29 MED ORDER — DOCUSATE SODIUM 100 MG PO CAPS
100.0000 mg | ORAL_CAPSULE | Freq: Two times a day (BID) | ORAL | Status: DC
  Administered 2014-11-29 – 2014-12-08 (×18): 100 mg via ORAL
  Filled 2014-11-29 (×19): qty 1

## 2014-11-29 MED ORDER — CEFTRIAXONE SODIUM IN DEXTROSE 20 MG/ML IV SOLN
1.0000 g | INTRAVENOUS | Status: DC
  Administered 2014-11-29 – 2014-12-08 (×10): 1 g via INTRAVENOUS
  Filled 2014-11-29 (×11): qty 50

## 2014-11-29 NOTE — Progress Notes (Signed)
Orthopedic Tech Progress Note Patient Details:  Marjean DonnaJohn Deveny 10/26/1945 [id number]  Patient ID: Marjean DonnaJohn Brendlinger, male   DOB: 02/15/1946, 69 y.o.   MRN: [id number] Called in bio-tech brace order; spoke with Richardean Chimeraathy  Aiyana Stegmann 11/29/2014, 9:21 AM

## 2014-11-29 NOTE — Progress Notes (Signed)
Pt arrived to 4N04 from PACU at 2130. Pt A&O x 4, pain level of 6/10 to lower back comfortable per patient. CDI honeycomb dressing to surgical site is clean, dry and intact. Small amount of serosanguinous drainage in hemovac. Pt V/S taken, pt still on O2 from PACU, fluids running at 80 cc/hr. Foley intact, unclamped. Will monitor closely overnight.

## 2014-11-29 NOTE — Evaluation (Signed)
Physical Therapy Evaluation Patient Details Name: Marjean DonnaJohn Fruchter MRN: [id number] DOB: 01/30/1946 Today's Date: 11/29/2014   History of Present Illness  69 yo male who had multi level fusion about 5 weeks ago. He was doing well until 4 days ago when he developed progressive back pain and decreasing ability to ambulate. He came to ED today where he had an MRI of the back that showed a huge fluid collection c/w either an abscess or hematoma with marked neural compression. Patient s/p lumbar wound debridement   Clinical Impression  Patient presents with decreased independence with mobility due to deficits listed below in PT problem list.  He will benefit from skilled PT in the acute setting to allow return home to mod independent level.  Feel he is appropriate for CIR level therapies to maximize strength and activity tolerance in short time frame for return to independent.    Follow Up Recommendations CIR    Equipment Recommendations  None recommended by PT    Recommendations for Other Services       Precautions / Restrictions Precautions Precautions: Back Precaution Comments: Patient able to recall/verbalize 3/3 back precautions Required Braces or Orthoses: Spinal Brace Spinal Brace: Lumbar corset;Applied in supine position Restrictions Weight Bearing Restrictions: No      Mobility  Bed Mobility Overal bed mobility: Needs Assistance Bed Mobility: Rolling;Sidelying to Sit Rolling: Min guard Sidelying to sit: Mod assist       General bed mobility comments: NT, just OOB with OT assist  Transfers Overall transfer level: Needs assistance Equipment used: Rolling walker (2 wheeled) Transfers: Sit to/from Stand Sit to Stand: Min assist;Mod assist         General transfer comment: mod assist and increased time sitting or standing on recliner or lower surface with cues for breathing and increased pain; min assist from higher seat 3:1 over commode with  armrests  Ambulation/Gait Ambulation/Gait assistance: Supervision Ambulation Distance (Feet): 130 Feet Assistive device: Rolling walker (2 wheeled) Gait Pattern/deviations: Step-through pattern;Decreased stride length     General Gait Details: slow pace with increased time for turning  Stairs            Wheelchair Mobility    Modified Rankin (Stroke Patients Only)       Balance Overall balance assessment: Needs assistance Sitting-balance support: Feet supported;No upper extremity supported Sitting balance-Leahy Scale: Poor     Standing balance support: Bilateral upper extremity supported;No upper extremity supported;During functional activity Standing balance-Leahy Scale: Fair Standing balance comment: needs walker for ambulation due to pain and LE strength; standing static in bathroom to brush teeth no UE support supervision, cues for back precautions                             Pertinent Vitals/Pain Pain Assessment: 0-10 Pain Score: 5  (7/10 with transitional movements) Pain Location: back and right hip Pain Descriptors / Indicators: Aching Pain Intervention(s): Monitored during session;Repositioned    Home Living Family/patient expects to be discharged to:: Private residence Living Arrangements: Alone Available Help at Discharge: Neighbor;Available PRN/intermittently Type of Home: House Home Access: Stairs to enter Entrance Stairs-Rails: None Entrance Stairs-Number of Steps: 3 Home Layout: One level;Laundry or work area in Pitney Bowesbasement Home Equipment: Environmental consultantWalker - 2 wheels;Cane - single point;Bedside commode      Prior Function Level of Independence: Independent with assistive device(s)               Hand Dominance   Dominant Hand:  Right    Extremity/Trunk Assessment   Upper Extremity Assessment: Overall WFL for tasks assessed           Lower Extremity Assessment: RLE deficits/detail;LLE deficits/detail RLE Deficits / Details: AROM  grossly WFL, pain with resisted hip flexion, knee flexion and knee extension, mild numbness anterior/lateral right thigh; strength grossly 4-/5 LLE Deficits / Details: AROM grossly WFL, strength grossly 4-/5 with pain on right with resisted hip flexion and knee flexion   Cervical / Trunk Assessment: Other exceptions  Communication   Communication: No difficulties  Cognition Arousal/Alertness: Awake/alert Behavior During Therapy: WFL for tasks assessed/performed Overall Cognitive Status: Within Functional Limits for tasks assessed                      General Comments      Exercises        Assessment/Plan    PT Assessment Patient needs continued PT services  PT Diagnosis Difficulty walking;Acute pain;Generalized weakness   PT Problem List Decreased strength;Decreased mobility;Pain;Decreased balance;Decreased activity tolerance;Decreased knowledge of precautions  PT Treatment Interventions DME instruction;Gait training;Stair training;Functional mobility training;Therapeutic activities;Therapeutic exercise;Balance training;Neuromuscular re-education;Patient/family education   PT Goals (Current goals can be found in the Care Plan section) Acute Rehab PT Goals Patient Stated Goal: go to hospital rehab before going home PT Goal Formulation: With patient Time For Goal Achievement: 12/05/14 Potential to Achieve Goals: Good    Frequency Min 5X/week   Barriers to discharge Decreased caregiver support      Co-evaluation               End of Session Equipment Utilized During Treatment: Back brace;Gait belt Activity Tolerance: Patient tolerated treatment well Patient left: in chair;with call bell/phone within reach           Time: 0905-0932 PT Time Calculation (min) (ACUTE ONLY): 27 min   Charges:   PT Evaluation $Initial PT Evaluation Tier I: 1 Procedure PT Treatments $Gait Training: 8-22 mins   PT G Codes:        Mollye Guinta,CYNDI Dec 21, 2014, 9:38 AM  Sheran Lawless, PT [phone] 2014/12/21

## 2014-11-29 NOTE — Progress Notes (Signed)
Rehab Admissions Coordinator Note:  Patient was screened by Lui Bellis L for appropriateness for an Inpatient Acute Rehab Consult.  At this time, we are recommending Inpatient Rehab consult.  Ghazal Pevey L 11/29/2014, 9:32 AM  I can be reached at [phone].

## 2014-11-29 NOTE — Progress Notes (Signed)
Patient ID: Troy Collier, male   DOB: 08/17/1946, 69 y.o.   MRN: [id number] Afeb, vss No new neuro issues Feels a lot better than pre op. Drains working well. No word on cultures as of yet. Will add rocephin until cultures start to grow. Can start to increase activity today.

## 2014-11-29 NOTE — Evaluation (Signed)
Occupational Therapy Evaluation Patient Details Name: Troy Collier MRN: [id number] DOB: 03/04/1946 Today's Date: 11/29/2014    History of Present Illness 69 yo male who had multi level fusion about 5 weeks ago. He was doing well until 4 days ago when he developed progressive back pain and decreasing ability to ambulate. He came to ED today where he had an MRI of the back that showed a huge fluid collection c/w either an abscess or hematoma with marked neural compression. Patient s/p lumbar wound debridement    Clinical Impression   Patient mod I PTA. Patient currently requires up to mod assist for LB ADLs, mod assist for sit<>stands, and mod assist for bed mobility. Patient currently limited by increased pain and decreased overall strength. Patient with decreased caregiver support at home and will benefit from a short length of stay extensive and comprehensive CIR prior to discharging>home. Patient will benefit from acute OT to increase overall independence in the areas of ADLs, functional mobility, and overall safety in order to safely discharge to venue listed below.     Follow Up Recommendations  CIR;Supervision/Assistance - 24 hour    Equipment Recommendations  None recommended by OT    Recommendations for Other Services Rehab consult     Precautions / Restrictions Precautions Precautions: Back Precaution Comments: Patient able to recall/verbalize 3/3 back precautions Required Braces or Orthoses: Spinal Brace Spinal Brace: Lumbar corset;Applied in supine position Restrictions Weight Bearing Restrictions: No      Mobility Bed Mobility Overal bed mobility: Needs Assistance Bed Mobility: Rolling;Sidelying to Sit Rolling: Min guard Sidelying to sit: Mod assist       General bed mobility comments: Patient heavily using rails for sidelying >sit(pt with no rails at home), still required mod assist for trunk support.   Transfers Overall transfer level: Needs  assistance Equipment used: Rolling walker (2 wheeled) Transfers: Sit to/from Stand Sit to Stand: Mod assist General transfer comment: Mod assist to assist patient with lifting and lowering during sit<>stand transfer using RW. Cues for correct hand placement and overall safety required.     Balance Overall balance assessment: Needs assistance Sitting-balance support: Feet supported;No upper extremity supported Sitting balance-Leahy Scale: Poor     Standing balance support: Bilateral upper extremity supported;During functional activity Standing balance-Leahy Scale: Fair     ADL Overall ADL's : Needs assistance/impaired Eating/Feeding: Set up;Sitting   Grooming: Sitting;Set up   Upper Body Bathing: Set up;Sitting   Lower Body Bathing: Moderate assistance;Sit to/from stand;Cueing for safety   Upper Body Dressing : Set up;Sitting   Lower Body Dressing: Moderate assistance;Sit to/from stand;Cueing for safety   Toilet Transfer: Minimal assistance;RW;BSC;Grab bars           Functional mobility during ADLs: Min guard;Rolling walker;Cueing for safety General ADL Comments: Patient limited by pain and weakness in BLEs. Patient states he was using AE for LB ADLs PTA. Recommend patient continue to use this equipment, without it he requires up to mod assist for LB ADLs. Patient very motivated to be independent. He lives alone and has assistance intermittently from [place] throughout the day.     Pertinent Vitals/Pain Pain Assessment: 0-10 Pain Score: 5  (7/10 with transitional movements) Pain Location: back and right hip Pain Descriptors / Indicators: Aching Pain Intervention(s): Monitored during session;Repositioned     Hand Dominance Right   Extremity/Trunk Assessment Upper Extremity Assessment Upper Extremity Assessment: Overall WFL for tasks assessed   Lower Extremity Assessment Lower Extremity Assessment: Defer to PT evaluation   Cervical /  Trunk Assessment Cervical  / Trunk Assessment: Normal   Communication Communication Communication: No difficulties   Cognition Arousal/Alertness: Awake/alert Behavior During Therapy: WFL for tasks assessed/performed Overall Cognitive Status: Within Functional Limits for tasks assessed              Home Living Family/patient expects to be discharged to:: Private residence Living Arrangements: Alone Available Help at Discharge: Neighbor;Available PRN/intermittently Type of Home: House Home Access: Stairs to enter [employer] of Steps: 3 Entrance Stairs-Rails: None Home Layout: One level;Laundry or work area in basement     [employer] Shower/Tub: Walk-in shower;Curtain   Bathroom Toilet: Handicapped height     Home Equipment: Environmental consultant - 2 wheels;Cane - single point;Bedside commode    Prior Functioning/Environment Level of Independence: Independent with assistive device(s)      OT Diagnosis: Generalized weakness;Acute pain   OT Problem List: Decreased strength;Impaired balance (sitting and/or standing);Pain;Decreased knowledge of precautions;Decreased knowledge of use of DME or AE;Decreased activity tolerance   OT Treatment/Interventions: Self-care/ADL training;DME and/or AE instruction;Balance training;Patient/family education;Energy conservation;Therapeutic activities    OT Goals(Current goals can be found in the care plan section) Acute Rehab OT Goals Patient Stated Goal: go to hospital rehab before going home OT Goal Formulation: With patient Time For Goal Achievement: 12/06/14 Potential to Achieve Goals: Good ADL Goals Pt Will Perform Grooming: with supervision;standing Pt Will Perform Lower Body Bathing: with min assist;with adaptive equipment;sit to/from stand Pt Will Perform Lower Body Dressing: with min assist;with adaptive equipment;sit to/from stand Pt Will Transfer to Toilet: with supervision;ambulating;bedside commode Pt Will Perform Toileting - Clothing Manipulation and  hygiene: with supervision;sit to/from stand;with adaptive equipment Pt Will Perform Tub/Shower Transfer: with supervision;3 in 1;ambulating;rolling walker;Shower transfer Additional ADL Goal #1: Patient will perform sit<>stands with supervision consistently   OT Frequency: Min 2X/week   Barriers to D/C: Decreased caregiver support          End of Session Equipment Utilized During Treatment: Gait belt;Rolling walker;Back brace  Activity Tolerance: Patient tolerated treatment well Patient left: in chair;with call bell/phone within reach   Time: 0836-0902 OT Time Calculation (min): 26 min Charges:  OT General Charges $OT Visit: 1 Procedure OT Evaluation $Initial OT Evaluation Tier I: 1 Procedure OT Treatments $Self Care/Home Management : 8-22 mins  Jumana Paccione , MS, OTR/L, CLT Pager: [phone]  11/29/2014, 9:22 AM

## 2014-11-29 NOTE — Care Management Note (Signed)
    Page 1 of 1   12/08/2014     1:38:48 PM CARE MANAGEMENT NOTE 12/08/2014  Patient:  Troy Collier,Troy Collier   Account Number:  [id number]  Date Initiated:  11/29/2014  Documentation initiated by:  Elmer BalesOBARGE,Yetunde Leis  Subjective/Objective Assessment:   Patient was admitted with epidural abscess. Underwent I&D. [facility] following initial surgery. Patient is from home alone.     Action/Plan:   Will follow for discharge needs pending PT/OT evals and physician orders.   Anticipated DC Date:  12/08/2014   Anticipated DC Plan:  IP REHAB FACILITY  In-house referral  Clinical Social Worker         Choice offered to / List presented to:             Status of service:  Completed, signed off Medicare Important Message given?  YES (If response is "NO", the following Medicare IM given date fields will be blank) Date Medicare IM given:  12/04/2014 Medicare IM given by:  Lawerance SabalSWIST,DEBBIE Date Additional Medicare IM given:  12/08/2014 Additional Medicare IM given by:  Elmer BalesOURTNEY Shaughnessy Gethers  Discharge Disposition:  SKILLED NURSING FACILITY  Per UR Regulation:  Reviewed for med. necessity/level of care/duration of stay  If discussed at Long Length of Stay Meetings, dates discussed:    Comments:  12/08/14 1100 Elmer Balesourtney Lailoni Baquera RN, MSN, CM- Additional Medicare IM letter provided.   4-4 pt provided with another copy of IM letter Parthenia Amesebbie Swsit RN BSN CM

## 2014-11-29 NOTE — Progress Notes (Signed)
UR complete.  Llesenia Fogal RN, MSN 

## 2014-11-30 NOTE — Progress Notes (Signed)
Pt ambulated with rolling walker, brace on and aligned to the bathroom. Pt refused to ambulate in the hall stating that he wanted to rest. He agreed later to ambulate in the hall. Call bell within reach. Will continue to monitor.

## 2014-11-30 NOTE — Progress Notes (Signed)
Pt ambulated with rolling walker, brace on and aligned in the hall 200 ft. Gait steady. No noted distress. Hemovac attached, to suction. Surgical dressing site dry and intact. Will continue to monitor.

## 2014-11-30 NOTE — Progress Notes (Signed)
PT Cancellation Note  Patient Details Name: Troy Collier MRN: [id number] DOB: 08/21/1946   Cancelled Treatment:    Reason Eval/Treat Not Completed: Pain limiting ability to participate; patient reports pain in back similar to when pain started at home due to abscess, did walk with tech this morning, but now back to bed and awaiting pain medication.  Will attempt again tomorrow.   WYNN,CYNDI 11/30/2014, 10:28 AM

## 2014-12-01 DIAGNOSIS — Z0279 Encounter for issue of other medical certificate: Secondary | ICD-10-CM

## 2014-12-01 LAB — BASIC METABOLIC PANEL
ANION GAP: 4 — AB (ref 5–15)
BUN: 11 mg/dL (ref 6–23)
CO2: 31 mmol/L (ref 19–32)
CREATININE: 0.67 mg/dL (ref 0.50–1.35)
Calcium: 8.5 mg/dL (ref 8.4–10.5)
Chloride: 98 mmol/L (ref 96–112)
Glucose, Bld: 91 mg/dL (ref 70–99)
Potassium: 4 mmol/L (ref 3.5–5.1)
Sodium: 133 mmol/L — ABNORMAL LOW (ref 135–145)

## 2014-12-01 LAB — VANCOMYCIN, TROUGH: Vancomycin Tr: 13.1 ug/mL (ref 10.0–20.0)

## 2014-12-01 LAB — WOUND CULTURE: CULTURE: NO GROWTH

## 2014-12-01 MED ORDER — RIFAMPIN 300 MG PO CAPS
300.0000 mg | ORAL_CAPSULE | Freq: Two times a day (BID) | ORAL | Status: DC
  Administered 2014-12-01 – 2014-12-08 (×15): 300 mg via ORAL
  Filled 2014-12-01 (×15): qty 1

## 2014-12-01 MED ORDER — SODIUM CHLORIDE 0.9 % IJ SOLN
10.0000 mL | INTRAMUSCULAR | Status: DC | PRN
  Administered 2014-12-07 (×2): 10 mL
  Filled 2014-12-01 (×2): qty 40

## 2014-12-01 MED ORDER — VANCOMYCIN HCL 10 G IV SOLR
1500.0000 mg | Freq: Two times a day (BID) | INTRAVENOUS | Status: DC
  Administered 2014-12-01 – 2014-12-08 (×14): 1500 mg via INTRAVENOUS
  Filled 2014-12-01 (×15): qty 1500

## 2014-12-01 NOTE — Progress Notes (Signed)
Noted single lumen PICC to right upper arm transparent dressing clean, dry, and intact. Pt denies pain or discomfort to site. Will continue to monitor.

## 2014-12-01 NOTE — Progress Notes (Signed)
Patient ID: Troy DonnaJohn Collier, male   DOB: 11/03/1945, 69 y.o.   MRN: [id number] Afeb, vss No new neuro issues Feels much better and is ambulating well. Will get PICC line placed. Nothing has grown on culture, but this was obviously abscess, and he will need 8 weeks of IV antibiotics. He feels he would benefit from a short stay at a SNF, and will get CSW involved.

## 2014-12-01 NOTE — Clinical Social Work Note (Signed)
Clinical Social Worker has assessed patient. Full psychosocial assessment to follow.  Derenda FennelBashira Anjela Cassara, MSW, LCSWA [phone] 12/01/2014 3:13 PM

## 2014-12-01 NOTE — Progress Notes (Signed)
Occupational Therapy Treatment Patient Details Name: Troy Collier MRN: [id number] DOB: [birth year] Today's Date: 12/01/2014    History of present illness 69 yo male who had multi level fusion about 5 weeks ago. He was doing well until 4 days ago when he developed progressive back pain and decreasing ability to ambulate. He came to ED today where he had an MRI of the back that showed a huge fluid collection c/w either an abscess or hematoma with marked neural compression. Patient s/p lumbar wound debridement    OT comments  Pt. Progressing well with skilled OT.  Making gains with mobility and functional transfers.  Completing ub ADLS and grooming tasks in standing with S.  See below, as d/c plans have changed and need to be updated.    Follow Up Recommendations  CIR;Supervision/Assistance - 24 hour;SNF pt. Is stating SNF will be best option for him to ensure increasing independence and safety for return home, as he lives alone.  Has been to [facility] before and liked it but also interested in options closer to home.     Equipment Recommendations  None recommended by OT    Recommendations for Other Services      Precautions / Restrictions Precautions Precautions: Back Precaution Comments: Patient able to recall/verbalize 3/3 back precautions Required Braces or Orthoses: Spinal Brace Spinal Brace: Lumbar corset;Applied in supine position (pt. states he is allowed to don in sitting, states he has never donned supine, so donned in sitting) Restrictions Weight Bearing Restrictions: No       Mobility Bed Mobility Overal bed mobility: Needs Assistance Bed Mobility: Rolling;Sidelying to Sit Rolling: Supervision Sidelying to sit: Supervision;HOB elevated       General bed mobility comments: able to initiate log roll with s, and use of bed rails  Transfers Overall transfer level: Needs assistance Equipment used: Rolling walker (2 wheeled) Transfers: Sit to/from [employer] Sit to Stand: Supervision Stand pivot transfers: Supervision            Balance                                   ADL Overall ADL's : Needs assistance/impaired Eating/Feeding: Set up;Sitting   Grooming: Wash/dry hands;Wash/dry face;Oral care;Supervision/safety;Standing           Upper Body Dressing : Set up;Sitting Upper Body Dressing Details (indicate cue type and reason): pt. able to don brace      Toilet Transfer: Supervision/safety;Ambulation;BSC;Comfort height toilet;Grab bars;RW   Toileting- Clothing Manipulation and Hygiene: Sitting/lateral lean;Supervision/safety       Functional mobility during ADLs: Supervision/safety;Rolling walker General ADL Comments: pts. pain appears to be better managed this am.  able to tolerate bed mobility and functional transfers at S level.        Vision                     Perception     Praxis      Cognition   Behavior During Therapy: [facility] for tasks assessed/performed Overall Cognitive Status: Within Functional Limits for tasks assessed                       Extremity/Trunk Assessment               Exercises     Shoulder Instructions       General Comments      Pertinent Vitals/ Pain  Pain Assessment: 0-10 Pain Score: 6  Pain Location: back Pain Descriptors / Indicators: Aching Pain Intervention(s): Monitored during session;Premedicated before session;Repositioned  Home Living                                          Prior Functioning/Environment              Frequency Min 2X/week     Progress Toward Goals  OT Goals(current goals can now be found in the care plan section)  Progress towards OT goals: Progressing toward goals     Plan Discharge plan needs to be updated    Co-evaluation                 End of Session Equipment Utilized During Treatment: Rolling walker   Activity Tolerance Patient tolerated treatment  well   Patient Left in chair;with call bell/phone within reach;with chair alarm set   Nurse Communication          Time: [phone] OT Time Calculation (min): 32 min  Charges: OT General Charges $OT Visit: 1 Procedure OT Treatments $Self Care/Home Management : 23-37 mins  Robet LeuMorris, Neidra Girvan [place], COTA/L 12/01/2014, 8:22 AM

## 2014-12-01 NOTE — Progress Notes (Signed)
Physical Therapy Treatment Patient Details Name: Troy Collier MRN: [id number] DOB: 1946/05/04 Today's Date: 12/01/2014    History of Present Illness 69 yo male who had multi level fusion about 5 weeks ago. He was doing well until 4 days ago when he developed progressive back pain and decreasing ability to ambulate. He came to ED today where he had an MRI of the back that showed a huge fluid collection c/w either an abscess or hematoma with marked neural compression. Patient s/p lumbar wound debridement     PT Comments    Patient is progressing well with mobility at this time. Patient has been to [facility] in the past and would benefit from ongoing therapy to increase functional independence prior to returning home alone. Recommend SNF as patient too high level for CIR. Will update note accordingly.   Follow Up Recommendations  SNF     Equipment Recommendations  None recommended by PT    Recommendations for Other Services       Precautions / Restrictions Precautions Precautions: Back Precaution Comments: Patient able to recall/verbalize 3/3 back precautions Required Braces or Orthoses: Spinal Brace Spinal Brace: Lumbar corset;Applied in sitting position (Per MD ok in sitting or standing) Restrictions Weight Bearing Restrictions: No    Mobility  Bed Mobility Overal bed mobility: Needs Assistance Bed Mobility: Rolling;Sidelying to Sit Rolling: Supervision Sidelying to sit: Supervision;HOB elevated       General bed mobility comments: Up in recliner after OT  Transfers Overall transfer level: Needs assistance Equipment used: Rolling walker (2 wheeled) Transfers: Sit to/from [employer] Sit to Stand: Min guard Stand pivot transfers: Supervision       General transfer comment: [place] for safety. Cues for hand placement  Ambulation/Gait Ambulation/Gait assistance: Supervision Ambulation Distance (Feet): 180 Feet   Gait Pattern/deviations: Step-through  pattern;Decreased stride length     General Gait Details: Cues for upright posture and safety with turns.    Stairs            Wheelchair Mobility    Modified Rankin (Stroke Patients Only)       Balance                                    Cognition Arousal/Alertness: Awake/alert Behavior During Therapy: WFL for tasks assessed/performed Overall Cognitive Status: Within Functional Limits for tasks assessed                      Exercises      General Comments        Pertinent Vitals/Pain Pain Assessment: 0-10 Pain Score: 6  Pain Location: back Pain Descriptors / Indicators: Aching;Sore Pain Intervention(s): Monitored during session    Home Living                      Prior Function            PT Goals (current goals can now be found in the care plan section) Progress towards PT goals: Progressing toward goals    Frequency  Min 5X/week    PT Plan Current plan remains appropriate;Discharge plan needs to be updated    Co-evaluation             End of Session Equipment Utilized During Treatment: Back brace Activity Tolerance: Patient tolerated treatment well Patient left: in chair;with chair alarm set     Time: 4098-1191 PT Time Calculation (  min) (ACUTE ONLY): 16 min  Charges:  $Gait Training: 8-22 mins                    G Codes:      Fredrich BirksRobinette, Julia Elizabeth 12/01/2014, 10:01 AM  12/01/2014 Fredrich Birksobinette, Julia Elizabeth PTA [phone] pager [phone] office

## 2014-12-01 NOTE — Progress Notes (Signed)
ANTIBIOTIC CONSULT NOTE - Follow-up  Pharmacy Consult for vancomycin Indication: epidural abscess  Allergies  Allergen Reactions  . Accupril [Quinapril Hcl]   . Niacin And Related     Patient Measurements: Height: 5\' 8"  (172.7 cm) Weight: 193 lb 2 oz (87.6 kg) IBW/kg (Calculated) : 68.4  Body Weight: 95 kg  Vital Signs: Temp: 98 F (36.7 C) (04/01 0917) Temp Source: Oral (04/01 0917) BP: 149/81 mmHg (04/01 0917) Pulse Rate: 90 (04/01 0917) Intake/Output from previous day: 03/31 0701 - 04/01 0700 In: 3 [I.V.:3] Out: 35 [Drains:35] Intake/Output from this shift:    Labs:  Recent Labs  11/28/14 1652 12/01/14 0538  WBC 6.5  --   HGB 11.0*  --   PLT 608*  --   CREATININE 0.79 0.67   Estimated Creatinine Clearance: 96.4 mL/min (by C-G formula based on Cr of 0.67).  Recent Labs  12/01/14 1005  VANCOTROUGH 13.1     Microbiology: Recent Results (from the past 720 hour(s))  Anaerobic culture     Status: None (Preliminary result)   Collection Time: 11/28/14  7:02 PM  Result Value Ref Range Status   Specimen Description WOUND  Final   Special Requests LUMBAR SPECIMEN A  Final   Gram Stain   Final    ABUNDANT WBC PRESENT,BOTH PMN AND MONONUCLEAR NO SQUAMOUS EPITHELIAL CELLS SEEN NO ORGANISMS SEEN Performed at [facility]    Culture   Final    NO ANAEROBES ISOLATED; CULTURE IN PROGRESS FOR 5 DAYS Performed at [facility]    Report Status PENDING  Incomplete  Gram stain     Status: None   Collection Time: 11/28/14  7:02 PM  Result Value Ref Range Status   Specimen Description WOUND  Final   Special Requests LUMBAR WOUND SPECIMEN A  Final   Gram Stain   Final    ABUNDANT WBC PRESENT,BOTH PMN AND MONONUCLEAR NO ORGANISMS SEEN    Report Status 11/28/2014 FINAL  Final  Wound culture     Status: None   Collection Time: 11/28/14  7:02 PM  Result Value Ref Range Status   Specimen Description WOUND  Final   Special Requests LUMBAR WOUND  SPECIMEN A  Final   Gram Stain   Final    ABUNDANT WBC PRESENT,BOTH PMN AND MONONUCLEAR NO SQUAMOUS EPITHELIAL CELLS SEEN NO ORGANISMS SEEN Performed at [facility]    Culture   Final    NO GROWTH 2 DAYS Performed at [facility]    Report Status 12/01/2014 FINAL  Final   Assessment: 68 yom continues on IV vancomycin for treatment of an epidural abscess. He is also on ceftriaxone. Cultures are negative to date. Pt is afebrile and WBC is WNL. Scr is also stable and WNL. A vancomycin trough was checked today and is 13.1. Will shoot for slightly higher trough to ensure adequate concentrations to treat abscess.   Vanc 3/29>> CTX 3/30>>  3/29 Wound - NGTD  Goal of Therapy:  Vancomycin trough level 15-20 mcg/ml  Plan:  - Increase vancomycin to 1500mg  IV Q12H - F/u renal fxn, C&S, clinical status and [facility]  Aaleah Hirsch, PharmD, BCPS Pager # [phone] 12/01/2014 11:36 AM

## 2014-12-01 NOTE — Clinical Social Work Psychosocial (Signed)
Clinical Social Work Department BRIEF PSYCHOSOCIAL ASSESSMENT 12/01/2014  Patient:  Troy Collier,Troy Collier     Account Number:  [id number]     Admit date:  11/28/2014  Clinical Social Worker:  Glendon Axe, CLINICAL SOCIAL WORKER  Date/Time:  12/01/2014 07:03 PM  Referred by:  Physician  Date Referred:  12/01/2014 Referred for  SNF Placement   Other Referral:   Interview type:  Patient Other interview type:   CSW met with patient at bedside.    PSYCHOSOCIAL DATA Living Status:  FACILITY Admitted from facility:  [facility] Level of care:  [place] Primary support name:  Gennette Pac Primary support relationship to patient:  SPOUSE Degree of support available:   Strong    CURRENT CONCERNS Current Concerns  Post-Acute Placement   Other Concerns:    SOCIAL WORK ASSESSMENT / PLAN Clinical Social Worker met with patient in reference to post-acute placement/ pt's return to SNF. Pt is a resident of [facility] and is agreeable to return however made an inquiry in regards to facilities closer to [place], [place]. Pt has been a resident at [facility] for about a month and reported he understands the SNF process. CSW to fax pt's clinicals to facilities in the [facility] area as requested by pt. Pt reported he is feeling well and would agree to returning to [facility] if bed is not available at other facility. CSW will continue to follow pt for continued support and to facilitate pt's discharge once medically stable.   Assessment/plan status:  Psychosocial Support/Ongoing Assessment of Needs Other assessment/ plan:   To fax pt's clinicals to [facility] area.  FL-2 on chart for MD signature.   Information/referral to community resources:   SNF.    PATIENT'S/FAMILY'S RESPONSE TO PLAN OF CARE: Pt lying in bed, alert and oriented. Pt smiling and reported he is in good spirits. Pt's agreeable to returning to [facility] if bed is  not available at facility closer to home. Pt pleasant and appreciated social work intervention.    Glendon Axe, MSW, LCSWA [phone] 12/01/2014 7:10 PM

## 2014-12-01 NOTE — Progress Notes (Signed)
Peripherally Inserted Central Catheter/Midline Placement  The IV Nurse has discussed with the patient and/or persons authorized to consent for the patient, the purpose of this procedure and the potential benefits and risks involved with this procedure.  The benefits include less needle sticks, lab draws from the catheter and patient may be discharged home with the catheter.  Risks include, but not limited to, infection, bleeding, blood clot (thrombus formation), and puncture of an artery; nerve damage and irregular heat beat.  Alternatives to this procedure were also discussed.  PICC/Midline Placement Documentation  PICC / Midline Single Lumen 12/01/14 PICC Right Basilic 42 cm 2 cm (Active)  Indication for Insertion or Continuance of Line Home intravenous therapies (PICC only) 12/01/2014  5:00 PM  Exposed Catheter (cm) 2 cm 12/01/2014  5:00 PM  Dressing Change Due 12/08/14 12/01/2014  5:00 PM       Stacie GlazeJoyce, Marshia Tropea Horton 12/01/2014, 5:07 PM

## 2014-12-02 MED ORDER — BISACODYL 10 MG RE SUPP
10.0000 mg | Freq: Every day | RECTAL | Status: DC | PRN
  Administered 2014-12-02: 10 mg via RECTAL
  Filled 2014-12-02: qty 1

## 2014-12-02 MED ORDER — ACETAMINOPHEN 325 MG PO TABS: 650.0000 mg | ORAL_TABLET | ORAL | Status: DC | PRN

## 2014-12-02 MED ORDER — HYDROMORPHONE HCL 1 MG/ML IJ SOLN
1.0000 mg | INTRAMUSCULAR | Status: DC | PRN
  Administered 2014-12-02 – 2014-12-07 (×23): 1 mg via INTRAVENOUS
  Filled 2014-12-02 (×23): qty 1

## 2014-12-02 MED ORDER — POLYETHYLENE GLYCOL 3350 17 G PO PACK
17.0000 g | PACK | Freq: Every day | ORAL | Status: DC | PRN
  Administered 2014-12-02 – 2014-12-04 (×2): 17 g via ORAL
  Filled 2014-12-02: qty 1

## 2014-12-02 MED ORDER — OXYCODONE HCL 5 MG PO TABS
15.0000 mg | ORAL_TABLET | ORAL | Status: DC | PRN
  Administered 2014-12-02 – 2014-12-07 (×9): 15 mg via ORAL
  Filled 2014-12-02 (×9): qty 3

## 2014-12-02 MED ORDER — FLEET ENEMA 7-19 GM/118ML RE ENEM
1.0000 | ENEMA | Freq: Every day | RECTAL | Status: DC | PRN
  Administered 2014-12-02: 1 via RECTAL
  Filled 2014-12-02: qty 1

## 2014-12-02 MED ORDER — ACETAMINOPHEN 650 MG RE SUPP: 650.0000 mg | RECTAL | Status: DC | PRN

## 2014-12-02 NOTE — Progress Notes (Signed)
Physical Therapy Treatment Patient Details Name: Troy Collier MRN: [id number] DOB: May 10, 1946 Today's Date: 12/02/2014    History of Present Illness 69 yo male who had multi level fusion about 5 weeks ago. He was doing well until 4 days ago when he developed progressive back pain and decreasing ability to ambulate. He came to ED today where he had an MRI of the back that showed a huge fluid collection c/w either an abscess or hematoma with marked neural compression. Patient s/p lumbar wound debridement     PT Comments    Patient limited with ambulation this session due to increased pain. Patient stated that he had a rough night managing his pain and didn't sleep well as a result. Patient up in the bathroom upon entering and agreeable to ambulation. Continue to recommend SNF for ongoing therapy in order to increase functional independence prior to returning home alone  Follow Up Recommendations  SNF     Equipment Recommendations  None recommended by PT    Recommendations for Other Services       Precautions / Restrictions Precautions Precautions: Back Precaution Comments: Patient able to recall/verbalize 3/3 back precautions Required Braces or Orthoses: Spinal Brace Spinal Brace: Lumbar corset;Applied in sitting position    Mobility  Bed Mobility               General bed mobility comments: UP in restroom with nursing staff and wanted to remain up for breakfast  Transfers Overall transfer level: Needs assistance Equipment used: Rolling walker (2 wheeled)   Sit to Stand: Min guard         General transfer comment: Minguard for safety. Cues for hand placement  Ambulation/Gait Ambulation/Gait assistance: Min guard Ambulation Distance (Feet): 80 Feet Assistive device: Rolling walker (2 wheeled) Gait Pattern/deviations: Step-through pattern;Decreased stride length Gait velocity: decreased Gait velocity interpretation: Below normal speed for age/gender General Gait  Details: Cues for upright posture. LImited ambulation today due to increased pain   Stairs            Wheelchair Mobility    Modified Rankin (Stroke Patients Only)       Balance                                    Cognition Arousal/Alertness: Awake/alert Behavior During Therapy: WFL for tasks assessed/performed Overall Cognitive Status: Within Functional Limits for tasks assessed                      Exercises      General Comments        Pertinent Vitals/Pain Pain Score: 8  Pain Location: back Pain Descriptors / Indicators: Aching;Sore Pain Intervention(s): Monitored during session    Home Living                      Prior Function            PT Goals (current goals can now be found in the care plan section) Progress towards PT goals: Progressing toward goals    Frequency  Min 5X/week    PT Plan Current plan remains appropriate    Co-evaluation             End of Session Equipment Utilized During Treatment: Back brace Activity Tolerance: Patient tolerated treatment well Patient left: in chair;with chair alarm set     Time: [phone] PT Time Calculation (min) (ACUTE  ONLY): 14 min  Charges:  $Gait Training: 8-22 mins                    G Codes:      Fredrich BirksRobinette, Julia Elizabeth 12/02/2014, 8:12 AM 12/02/2014 Fredrich Birksobinette, Julia Elizabeth PTA [phone] pager [phone] office

## 2014-12-02 NOTE — Progress Notes (Signed)
Reports constipation; very uncomfortable today and increased c/o pain; administered a biscodyl suppository with fair results this am; administered a fleets enema this evening we good results; patient feels better with decreased pain and discomfort.

## 2014-12-02 NOTE — Progress Notes (Signed)
No issues overnight. Pt has incisional back pain, and buttock pain. Also c/o nausea.  EXAM:  BP 141/77 mmHg  Pulse 76  Temp(Src) 98.1 F (36.7 C) (Oral)  Resp 18  Ht 5\' 8"  (1.727 m)  Wt 87.6 kg (193 lb 2 oz)  BMI 29.37 kg/m2  SpO2 94%  Awake, alert, oriented  Speech fluent, appropriate  CN grossly intact  5/5 BUE/BLE  Wound dry, drain in place, ~50cc  IMPRESSION:  69 y.o. male s/p wound exploration/debridement  PLAN: - Planning on SNF placement - Will increase pain medication - Zofran PRN for nausea

## 2014-12-03 LAB — ANAEROBIC CULTURE

## 2014-12-03 NOTE — Progress Notes (Signed)
Patient ID: Troy Collier, male   DOB: 02/06/1946, 69 y.o.   MRN: [id number] Pain under better control with iv dilaudid. Neuro stable.

## 2014-12-04 MED ORDER — PANTOPRAZOLE SODIUM 40 MG PO TBEC
40.0000 mg | DELAYED_RELEASE_TABLET | Freq: Every day | ORAL | Status: DC
  Administered 2014-12-04 – 2014-12-08 (×5): 40 mg via ORAL
  Filled 2014-12-04 (×5): qty 1

## 2014-12-04 NOTE — Progress Notes (Signed)
Physical Therapy Treatment Patient Details Name: Troy Collier MRN: [id number] DOB: [birth year] Today's Date: 12/04/2014    History of Present Illness 69 yo male who had multi level fusion about 5 weeks ago. He was doing well until 4 days ago when he developed progressive back pain and decreasing ability to ambulate. He came to ED today where he had an MRI of the back that showed a huge fluid collection c/w either an abscess or hematoma with marked neural compression. Patient s/p lumbar wound debridement     PT Comments    Patient progressing compared to last treatment. Patient overall supervision level. Largest limitation at this time is his pain. Continue to recommend SNF for ongoing therapy to increase functional independence prior to returning home alone.   Follow Up Recommendations  SNF     Equipment Recommendations  None recommended by PT    Recommendations for Other Services       Precautions / Restrictions Precautions Precautions: Back Precaution Comments: Patient able to recall/verbalize 3/3 back precautions Required Braces or Orthoses: Spinal Brace Spinal Brace: Lumbar corset;Applied in sitting position    Mobility  Bed Mobility               General bed mobility comments: Patient up in recliner upon entering. Deferred practiced with bed mobilty due to pain  Transfers Overall transfer level: Needs assistance Equipment used: Rolling walker (2 wheeled)   Sit to Stand: Supervision         General transfer comment: Patient pulls up on RW, stated too much pain to push up from armrest  Ambulation/Gait Ambulation/Gait assistance: Supervision Ambulation Distance (Feet): 120 Feet Assistive device: Rolling walker (2 wheeled) Gait Pattern/deviations: Decreased stride length     General Gait Details: Cues for upright posture. Patient with increased weight through UEs. Cues to relax shoulders   Stairs            Wheelchair Mobility    Modified Rankin  (Stroke Patients Only)       Balance                                    Cognition Arousal/Alertness: Awake/alert Behavior During Therapy: WFL for tasks assessed/performed Overall Cognitive Status: Within Functional Limits for tasks assessed                      Exercises      General Comments        Pertinent Vitals/Pain Pain Score: 8  Pain Location: back Pain Descriptors / Indicators: Constant;Sore Pain Intervention(s): Monitored during session;Repositioned;Patient requesting pain meds-RN notified    Home Living                      Prior Function            PT Goals (current goals can now be found in the care plan section) Progress towards PT goals: Progressing toward goals    Frequency  Min 5X/week    PT Plan Current plan remains appropriate    Co-evaluation             End of Session Equipment Utilized During Treatment: Back brace Activity Tolerance: Patient limited by pain Patient left: in chair;with call bell/phone within reach (on toilet)     Time: 9562-1308 PT Time Calculation (min) (ACUTE ONLY): 13 min  Charges:  $Gait Training: 8-22 mins  G Codes:      Fredrich BirksRobinette, Isobel Eisenhuth Elizabeth 12/04/2014, 9:18 AM  12/04/2014 Fredrich Birksobinette, Mileena Rothenberger Elizabeth PTA [phone] pager [phone] office

## 2014-12-04 NOTE — Progress Notes (Signed)
CARE MANAGEMENT NOTE 12/04/2014  Patient:  Troy Collier,Troy Collier   Account Number:  [id number]  Date Initiated:  11/29/2014  Documentation initiated by:  Elmer BalesOBARGE,COURTNEY  Subjective/Objective Assessment:   Patient was admitted with epidural abscess. Underwent I&D. [facility] following initial surgery. Patient is from home alone.     Action/Plan:   Will follow for discharge needs pending PT/OT evals and physician orders.   Anticipated DC Date:     Anticipated DC Plan:  IP REHAB FACILITY  In-house referral  Clinical Social Worker         Choice offered to / List presented to:             Status of service:  In process, will continue to follow Medicare Important Message given?  YES (If response is "NO", the following Medicare IM given date fields will be blank) Date Medicare IM given:  12/04/2014 Medicare IM given by:  Lawerance SabalSWIST,Yenny Kosa Date Additional Medicare IM given:   Additional Medicare IM given by:    Discharge Disposition:    Per UR Regulation:  Reviewed for med. necessity/level of care/duration of stay  If discussed at Long Length of Stay Meetings, dates discussed:    Comments:  4-4 pt provided with another copy of IM letter Parthenia Amesebbie Swsit RN BSN CM

## 2014-12-04 NOTE — Progress Notes (Signed)
ANTIBIOTIC CONSULT NOTE - FOLLOW UP  Pharmacy Consult:  Vancomycin Indication:  Epidural abscess  Allergies  Allergen Reactions  . Accupril [Quinapril Hcl]   . Niacin And Related     Patient Measurements: Height: 5\' 8"  (172.7 cm) Weight: 193 lb 2 oz (87.6 kg) IBW/kg (Calculated) : 68.4  Vital Signs: Temp: 98.1 F (36.7 C) (04/04 0516) Temp Source: Oral (04/04 0516) BP: 147/72 mmHg (04/04 0516) Pulse Rate: 70 (04/04 0516) Intake/Output from previous day: 04/03 0701 - 04/04 0700 In: 80  Out: -   Labs: No results for input(s): WBC, HGB, PLT, LABCREA, CREATININE in the last 72 hours. Estimated Creatinine Clearance: 96.4 mL/min (by C-G formula based on Cr of 0.67).  Recent Labs  12/01/14 1005  VANCOTROUGH 13.1     Microbiology: Recent Results (from the past 720 hour(s))  Anaerobic culture     Status: None   Collection Time: 11/28/14  7:02 PM  Result Value Ref Range Status   Specimen Description WOUND  Final   Special Requests LUMBAR SPECIMEN A  Final   Gram Stain   Final    ABUNDANT WBC PRESENT,BOTH PMN AND MONONUCLEAR NO SQUAMOUS EPITHELIAL CELLS SEEN NO ORGANISMS SEEN Performed at [facility]    Culture   Final    NO ANAEROBES ISOLATED Performed at [facility]    Report Status 12/03/2014 FINAL  Final  Gram stain     Status: None   Collection Time: 11/28/14  7:02 PM  Result Value Ref Range Status   Specimen Description WOUND  Final   Special Requests LUMBAR WOUND SPECIMEN A  Final   Gram Stain   Final    ABUNDANT WBC PRESENT,BOTH PMN AND MONONUCLEAR NO ORGANISMS SEEN    Report Status 11/28/2014 FINAL  Final  Wound culture     Status: None   Collection Time: 11/28/14  7:02 PM  Result Value Ref Range Status   Specimen Description WOUND  Final   Special Requests LUMBAR WOUND SPECIMEN A  Final   Gram Stain   Final    ABUNDANT WBC PRESENT,BOTH PMN AND MONONUCLEAR NO SQUAMOUS EPITHELIAL CELLS SEEN NO ORGANISMS SEEN Performed at [facility]    Culture   Final    NO GROWTH 2 DAYS Performed at [facility]    Report Status 12/01/2014 FINAL  Final      Assessment: 68 YOM continues on vancomycin, ceftriaxone, and rifampin for treatment of an epidural abscess.  Patient's renal function has been stable and vancomycin dose recently adjusted to aim for a higher trough.  Vanc 3/29>> CTX 3/30>> Rifampin 4/1 >>  4/1 VT = 13.1 mcg/mL on 1250mg  q12 >> 1500mg  q12  3/29 lumbar wound - NGTD   Goal of Therapy:  Vancomycin trough level 15-20 mcg/ml   Plan:  - Continue vanc 1500mg  IV Q12H - Rocephin and rifampin per MD - Monitor renal fxn, clinical status, repeat vanc trough soon if not d/c'ed    Basir Niven D. Laney Potashang, PharmD, BCPS Pager:  [phone] 12/04/2014, 7:59 AM

## 2014-12-05 NOTE — Progress Notes (Signed)
Occupational Therapy Treatment Patient Details Name: Troy Collier MRN: [id number] DOB: [birth year] Today's Date: 12/05/2014    History of present illness 69 yo male who had multi level fusion about 5 weeks ago. He was doing well until 4 days ago when he developed progressive back pain and decreasing ability to ambulate. He came to ED today where he had an MRI of the back that showed a huge fluid collection c/w either an abscess or hematoma with marked neural compression. Patient s/p lumbar wound debridement    OT comments  Pt progressing towards acute OT goals. D/c plan to SNF remains appropriate as pt lives alone. Completed ADLs this session as detailed below.   Follow Up Recommendations  SNF;Supervision/Assistance - 24 hour    Equipment Recommendations  None recommended by OT    Recommendations for Other Services      Precautions / Restrictions Precautions Precautions: Back Precaution Booklet Issued: Yes (comment) Precaution Comments: reviewed Required Braces or Orthoses: Spinal Brace Spinal Brace: Lumbar corset;Applied in sitting position Restrictions Weight Bearing Restrictions: No       Mobility Bed Mobility Overal bed mobility: Needs Assistance Bed Mobility: Rolling;Sidelying to Sit Rolling: Supervision Sidelying to sit: Supervision;HOB elevated       General bed mobility comments: Educated on lower HOB before completing bed mobility to maintain logroll technique.  Transfers Overall transfer level: Needs assistance Equipment used: Rolling walker (2 wheeled) Transfers: Sit to/from Stand Sit to Stand: Supervision         General transfer comment: Pt uses both hands on rw during sit<>stand due to increased pain otherwise. Practiced sit<>stand with one arm on bed with height of bed elevated.     Balance Overall balance assessment: Needs assistance         Standing balance support: Bilateral upper extremity supported;During functional activity Standing  balance-Leahy Scale: Fair Standing balance comment: stood to brush teeth with rw; rw for pain management and balance                   ADL Overall ADL's : Needs assistance/impaired     Grooming: Wash/dry hands;Oral care;Supervision/safety;Standing                   Toilet Transfer: Supervision/safety;Ambulation;Comfort height toilet;Grab bars;RW   Toileting- Clothing Manipulation and Hygiene: Sitting/lateral lean;Supervision/safety       Functional mobility during ADLs: Supervision/safety;Rolling walker General ADL Comments: Pt completed bed mobility, ambulated household distance, completed toilet transfer and cm/h and completed grooming as detailed above and below. Reviewed precautions and AE/techniques for ADL completion and safety manuevering environment,       Vision                     Perception     Praxis      Cognition   Behavior During Therapy: [facility] for tasks assessed/performed Overall Cognitive Status: Within Functional Limits for tasks assessed                       Extremity/Trunk Assessment               Exercises     Shoulder Instructions       General Comments      Pertinent Vitals/ Pain       Pain Assessment: 0-10 Pain Score: 7  Pain Location: back Pain Descriptors / Indicators: Aching Pain Intervention(s): Monitored during session;Repositioned  Home Living  Prior Functioning/Environment              Frequency Min 2X/week     Progress Toward Goals  OT Goals(current goals can now be found in the care plan section)  Progress towards OT goals: Progressing toward goals  Acute Rehab OT Goals OT Goal Formulation: With patient Time For Goal Achievement: 12/06/14 Potential to Achieve Goals: Good ADL Goals Pt Will Perform Grooming: with supervision;standing Pt Will Perform Lower Body Bathing: with min assist;with adaptive equipment;sit to/from  stand Pt Will Perform Lower Body Dressing: with min assist;with adaptive equipment;sit to/from stand Pt Will Transfer to Toilet: with supervision;ambulating;bedside commode Pt Will Perform Toileting - Clothing Manipulation and hygiene: with supervision;sit to/from stand;with adaptive equipment Pt Will Perform Tub/Shower Transfer: with supervision;3 in 1;ambulating;rolling walker;Shower transfer Additional ADL Goal #1: Patient will perform sit<>stands with supervision consistently   Plan Discharge plan remains appropriate    Co-evaluation                 End of Session Equipment Utilized During Treatment: Rolling walker;Back brace   Activity Tolerance Patient tolerated treatment well   Patient Left in chair;with call bell/phone within reach;with chair alarm set   Nurse Communication Other (comment) (7/10 pain)        Time: [id number] OT Time Calculation (min): 24 min  Charges: OT General Charges $OT Visit: 1 Procedure OT Treatments $Self Care/Home Management : 23-37 mins  Pilar GrammesMathews, Lorisa Scheid H 12/05/2014, 9:50 AM

## 2014-12-05 NOTE — Clinical Social Work Note (Signed)
Patient can return to SNF, [facility] once medically stable for discharge.   Patient has expressed that he would like to be placed closer to the [place] area. CSW has faxed [facility] clinicals to [facility] [place] and left a message with admissions director. Patient is agreeable to returning to [facility] if [facility] cannot extend bed offer.   FL-2 on chart for MD signature.   Derenda FennelBashira Ekansh Sherk, MSW, LCSWA [phone] 12/05/2014 12:49 PM

## 2014-12-05 NOTE — Progress Notes (Addendum)
Physical Therapy Treatment Patient Details Name: Troy Collier MRN: [id number] DOB: 04/15/1946 Today's Date: 12/05/2014    History of Present Illness 69 yo male who had multi level fusion about 5 weeks ago. He was doing well until 4 days ago when he developed progressive back pain and decreasing ability to ambulate. He came to ED today where he had an MRI of the back that showed a huge fluid collection c/w either an abscess or hematoma with marked neural compression. Patient s/p lumbar wound debridement     PT Comments    Patient mobilizing well today, continues to required cues for safety at times. Ambulated in hall increased distance compared to previous session. Will continue to see and progress as tolerated.  Follow Up Recommendations  SNF     Equipment Recommendations  None recommended by PT    Recommendations for Other Services       Precautions / Restrictions Precautions Precautions: Back Precaution Booklet Issued: Yes (comment) Precaution Comments: reviewed Required Braces or Orthoses: Spinal Brace Spinal Brace: Lumbar corset;Applied in sitting position Restrictions Weight Bearing Restrictions: No    Mobility  Bed Mobility Overal bed mobility: Needs Assistance Bed Mobility: Rolling;Sidelying to Sit Rolling: Supervision Sidelying to sit: Supervision;HOB elevated     Sit to sidelying: Supervision General bed mobility comments: Educated on lower HOB before completing bed mobility to maintain logroll technique.  Transfers Overall transfer level: Needs assistance Equipment used: Rolling walker (2 wheeled) Transfers: Sit to/from Stand Sit to Stand: Supervision         General transfer comment: Patient cued for posture and positioning, pt prefers to use hands on RW to power up despite cues.  Ambulation/Gait Ambulation/Gait assistance: Supervision Ambulation Distance (Feet): 180 Feet Assistive device: Rolling walker (2 wheeled)   Gait velocity: decreased Gait  velocity interpretation: Below normal speed for age/gender General Gait Details: VCs for increased cadence, one standing rest break.    Stairs            Wheelchair Mobility    Modified Rankin (Stroke Patients Only)       Balance   Sitting-balance support: Feet supported Sitting balance-Leahy Scale: Good     Standing balance support: Bilateral upper extremity supported Standing balance-Leahy Scale: Fair                      Cognition Arousal/Alertness: Awake/alert Behavior During Therapy: WFL for tasks assessed/performed Overall Cognitive Status: Within Functional Limits for tasks assessed                      Exercises      General Comments        Pertinent Vitals/Pain Pain Assessment: 0-10 Pain Score: 8  Pain Location: b Pain Descriptors / Indicators: Aching Pain Intervention(s): RN gave pain meds during session;Relaxation;Monitored during session    Home Living                      Prior Function            PT Goals (current goals can now be found in the care plan section) Acute Rehab PT Goals PT Goal Formulation: With patient Time For Goal Achievement: 12/19/14 (current goals remain appropriate) Potential to Achieve Goals: Good Progress towards PT goals: Progressing toward goals    Frequency  Min 5X/week    PT Plan Current plan remains appropriate    Co-evaluation  End of Session Equipment Utilized During Treatment: Back brace Activity Tolerance: Patient limited by pain Patient left: in bed;with call bell/phone within reach;with bed alarm set     Time: 1610-9604 PT Time Calculation (min) (ACUTE ONLY): 17 min  Charges:  $Gait Training: 8-22 mins                    G CodesFabio Asa 01-04-2015, 5:16 PM Charlotte Crumb, PT DPT  [phone]

## 2014-12-05 NOTE — Progress Notes (Signed)
UR done Wells FargoDebbie Evona Westra RN BSN CM

## 2014-12-06 NOTE — Progress Notes (Signed)
Subjective: Patient reports sore in back and right thigh, overall feels he is improving  Objective: Vital signs in last 24 hours: Temp:  [97.7 F (36.5 C)-98.6 F (37 C)] 98.2 F (36.8 C) (04/06 0652) Pulse Rate:  [63-81] 73 (04/06 0652) Resp:  [18-20] 18 (04/06 0652) BP: (119-159)/(57-72) 140/72 mmHg (04/06 0652) SpO2:  [94 %-99 %] 97 % (04/06 0652)  Intake/Output from previous day: 04/05 0701 - 04/06 0700 In: -  Out: 55 [Drains:55] Intake/Output this shift:    Physical Exam: Strength good in legs.  Dressing CDI.  Lab Results: No results for input(s): WBC, HGB, HCT, PLT in the last 72 hours. BMET No results for input(s): NA, K, CL, CO2, GLUCOSE, BUN, CREATININE, CALCIUM in the last 72 hours.  Studies/Results: No results found.  Assessment/Plan: Will consult ID regarding antibiotics and length of therapy.  Also consult social work, as patient desires additional Rehab stay after discharge, as he lives alone.  Continue drain with 55 cc output.    LOS: 8 days    Dorian HeckleSTERN,Travonta Gill D, MD 12/06/2014, 7:34 AM

## 2014-12-07 DIAGNOSIS — R7309 Other abnormal glucose: Secondary | ICD-10-CM

## 2014-12-07 DIAGNOSIS — T814XXA Infection following a procedure, initial encounter: Principal | ICD-10-CM

## 2014-12-07 LAB — BASIC METABOLIC PANEL
ANION GAP: 5 (ref 5–15)
BUN: 8 mg/dL (ref 6–23)
CO2: 34 mmol/L — AB (ref 19–32)
CREATININE: 0.73 mg/dL (ref 0.50–1.35)
Calcium: 8.9 mg/dL (ref 8.4–10.5)
Chloride: 98 mmol/L (ref 96–112)
GFR calc Af Amer: >90 mL/min (ref >90)
GFR calc non Af Amer: >90 mL/min (ref >90)
Glucose, Bld: 116 mg/dL — ABNORMAL HIGH (ref 70–99)
Potassium: 3.6 mmol/L (ref 3.5–5.1)
Sodium: 137 mmol/L (ref 135–145)

## 2014-12-07 LAB — VANCOMYCIN, TROUGH
VANCOMYCIN TR: 34.7 ug/mL — AB (ref 10.0–20.0)
Vancomycin Tr: 15.4 ug/mL (ref 10.0–20.0)

## 2014-12-07 MED ORDER — OXYCODONE HCL 5 MG PO TABS
5.0000 mg | ORAL_TABLET | ORAL | Status: DC | PRN
  Administered 2014-12-07 – 2014-12-08 (×4): 10 mg via ORAL
  Filled 2014-12-07 (×4): qty 2

## 2014-12-07 MED ORDER — DESMOPRESSIN ACETATE 0.2 MG PO TABS
0.4000 mg | ORAL_TABLET | Freq: Three times a day (TID) | ORAL | Status: DC
  Administered 2014-12-07 – 2014-12-08 (×2): 0.4 mg via ORAL
  Filled 2014-12-07 (×4): qty 2

## 2014-12-07 MED ORDER — OXYCODONE HCL ER 10 MG PO T12A
30.0000 mg | EXTENDED_RELEASE_TABLET | Freq: Two times a day (BID) | ORAL | Status: DC
  Administered 2014-12-07 (×2): 30 mg via ORAL
  Filled 2014-12-07 (×2): qty 3

## 2014-12-07 NOTE — Progress Notes (Signed)
Subjective: Patient reports "The medicines reduce my pain, but they don't last very long. When the pain comes back, its significant"  Objective: Vital signs in last 24 hours: Temp:  [98 F (36.7 C)-98.8 F (37.1 C)] 98.8 F (37.1 C) (04/07 0535) Pulse Rate:  [72-90] 81 (04/07 0535) Resp:  [17-20] 18 (04/07 0535) BP: (122-152)/(56-77) 152/56 mmHg (04/07 0535) SpO2:  [95 %-100 %] 98 % (04/07 0535)  Intake/Output from previous day: 04/06 0701 - 04/07 0700 In: -  Out: 50 [Drains:50] Intake/Output this shift:    Alert, conversant. Good strength BLE. Honeycomb drsg intact, no erythema, swelling, or drainage. Hemovac remains - 50ml o/p overnight serous/blood tinged. Pain control is good for short periods, but continues to require IV Dilaudid.   Lab Results: No results for input(s): WBC, HGB, HCT, PLT in the last 72 hours. BMET No results for input(s): NA, K, CL, CO2, GLUCOSE, BUN, CREATININE, CALCIUM in the last 72 hours.  Studies/Results: No results found.  Assessment/Plan: Improving   LOS: 9 days  Will discuss long acting medication possibilities with Dr. Venetia MaxonStern, as well as Hemovac status. Continue to mobilize in LSO.    Troy Collier, Troy Collier 12/07/2014, 7:41 AM

## 2014-12-07 NOTE — Progress Notes (Signed)
Patient ID: Troy Collier, male   DOB: 01/28/1946, 69 y.o.   MRN: [id number]  Alert, reporting request for pain med at 3.5hrs after last Oxy15mg  dose. States pain remains in low back & right buttock/hamstring region. Strength is good. Worked with PT today. Planning to reassess drainage in Hemovac in AM, likely pulling at that time. Will call ID to verify consult request received. Dr. Venetia MaxonStern would like their input re: duration of antibiotic tx.    Troy CockerBrian Kianna Billet RN BSN

## 2014-12-07 NOTE — Progress Notes (Signed)
ANTIBIOTIC CONSULT NOTE - FOLLOW UP  Pharmacy Consult for Vancomycin Indication: Epidural abscess  Allergies  Allergen Reactions  . Accupril [Quinapril Hcl]   . Niacin And Related     Patient Measurements: Height:  (172.7 cm) Weight: 193 lb 2 oz (87.6 kg) IBW/kg (Calculated) : 68.4  Vital Signs: Temp: 98.4 F (36.9 C) (04/07 2125) Temp Source: Oral (04/07 2125) BP: 135/72 mmHg (04/07 2125) Pulse Rate: 73 (04/07 2125) Intake/Output from previous day: 04/06 0701 - 04/07 0700 In: -  Out: 50 [Drains:50] Intake/Output from this shift:    Labs:  Recent Labs  12/07/14 1115  CREATININE 0.73   Estimated Creatinine Clearance: 96.4 mL/min (by C-G formula based on Cr of 0.73).  Recent Labs  12/07/14 1115 12/07/14 2148  VANCOTROUGH 34.7* 15.4     Microbiology: Recent Results (from the past 720 hour(s))  Anaerobic culture     Status: None   Collection Time: 11/28/14  7:02 PM  Result Value Ref Range Status   Specimen Description WOUND  Final   Special Requests LUMBAR SPECIMEN A  Final   Gram Stain   Final    ABUNDANT WBC PRESENT,BOTH PMN AND MONONUCLEAR NO SQUAMOUS EPITHELIAL CELLS SEEN NO ORGANISMS SEEN Performed at [facility]    Culture   Final    NO ANAEROBES ISOLATED Performed at [facility]    Report Status 12/03/2014 FINAL  Final  Gram stain     Status: None   Collection Time: 11/28/14  7:02 PM  Result Value Ref Range Status   Specimen Description WOUND  Final   Special Requests LUMBAR WOUND SPECIMEN A  Final   Gram Stain   Final    ABUNDANT WBC PRESENT,BOTH PMN AND MONONUCLEAR NO ORGANISMS SEEN    Report Status 11/28/2014 FINAL  Final  Wound culture     Status: None   Collection Time: 11/28/14  7:02 PM  Result Value Ref Range Status   Specimen Description WOUND  Final   Special Requests LUMBAR WOUND SPECIMEN A  Final   Gram Stain   Final    ABUNDANT WBC PRESENT,BOTH PMN AND MONONUCLEAR NO SQUAMOUS EPITHELIAL CELLS  SEEN NO ORGANISMS SEEN Performed at [facility]    Culture   Final    NO GROWTH 2 DAYS Performed at [facility]    Report Status 12/01/2014 FINAL  Final    Anti-infectives    Start     Dose/Rate Route Frequency Ordered Stop   12/01/14 2200  vancomycin (VANCOCIN) 1,500 mg in sodium chloride 0.9 % 500 mL IVPB     1,500 mg 250 mL/hr over 120 Minutes Intravenous Every 12 hours 12/01/14 1133     12/01/14 1000  rifampin (RIFADIN) capsule 300 mg     300 mg Oral Every 12 hours 12/01/14 0827     11/29/14 1000  cefTRIAXone (ROCEPHIN) 1 g in dextrose 5 % 50 mL IVPB - Premix     1 g 100 mL/hr over 30 Minutes Intravenous Every 24 hours 11/29/14 0835     11/28/14 2200  vancomycin (VANCOCIN) 1,250 mg in sodium chloride 0.9 % 250 mL IVPB  Status:  Discontinued     1,250 mg 166.7 mL/hr over 90 Minutes Intravenous Every 12 hours 11/28/14 2147 12/01/14 1133   11/28/14 1930  vancomycin (VANCOCIN) 1 GM/200ML IVPB    Comments:  [name], [name]   : cabinet override      11/28/14 1930 11/29/14 0744   11/28/14 1911  bacitracin  50,000 Units in sodium chloride irrigation 0.9 % 500 mL irrigation  Status:  Discontinued       As needed 11/28/14 1911 11/28/14 2009      Assessment: 68 YOM who continues on Vancomycin for treatment of epidural abscess. Steady state vancomycin trough = 15.4 mcg/ml tonight on dose of 1500 mg IV q12hr. Trough was drawn at approprate time and it is a therapeutic level. I notified RN that it is okay to give the vancomycin as scheduled.  Vanc 3/29>> * 4/1 VT = 13.1 mcg/mL on 1250mg  q12 >> 1500mg  q12 * 4/7 VT = 15.4 mcg/ml on 1500 mg q12, continue same. CTX 3/30>>  3/29 Wound - NGTD  Goal of Therapy:  Vancomycin trough level 15-20 mcg/ml  Plan:  1. Continue Vancomycin 1500 mg IV every 12 hours 2. Will continue to follow renal function, culture results, LOT, and antibiotic de-escalation plans   Troy Collier, RPh Clinical Pharmacist Pager: [phone] 12/07/2014  11:41 PM

## 2014-12-07 NOTE — Clinical Documentation Improvement (Signed)
  MD's ans Surgeon's    "Large amounts of purulent material were still encountered and evacuated." is documented in the op note,   debridement is documented in the progress notes.  if possible please clarify how the material was evacuated?   Excisional vs. Non-excisional debridement.    If excisional debridement please also provide size of area debrided.  Thank you    Please clarify documentation in the medical record of "debridement."  Please document the below (2) key elements in a progress note:   Excisional vs. Non-excisional  Document the size of the debridement.  Thank You,  Lavonda JumboLawanda J Eiza Canniff ,RN Clinical Documentation Specialist:  [phone]  [facility]- [facility] Information [facility]

## 2014-12-07 NOTE — Consult Note (Signed)
[facility] for Infectious Disease  Date of Admission:  11/28/2014  Date of Consult:  12/07/2014  Reason for Consult: wound infection Referring Physician: Vertell Limber  Impression/Recommendation Wound infection L3-S1 decompression fusion, hardware DM- borderline  Would Continue his current anbx Plan on 6 weeks of therapy Needs weekly vanco Tr when dose stable Needs weekly LFTs while on rifampin I explained the side effects of rifampin to him.  Glad to see him in f/u if needed.   Thank you so much for this interesting consult,   Bobby Rumpf (pager) [phone] [email or web address]  Troy Collier is an 69 y.o. male.  HPI: 69 yo M with hx of L3-S1 decompression, fusion and instrumentation on 2-25. He was able to be d/c home on 2-29. He returned on 3-29 with 4 days of back pain, worsening ability to ambulate, and MRI showing large fluid collection. He was afebrile, WBC normal. He went to OR for debridement same day. He was started on ceftriaxone 3-30, vancomycin 3-29, rifampin 4-1. His Cx were (-).   He continues to have some pain but is able to work with PT.   Past Medical History  Diagnosis Date  . Hyperlipidemia   . Hypertension   . Diabetes insipidus   . Anxiety   . Sleep apnea     mild no cpap recommended  . Arthritis   . Fibromyalgia   . History of kidney stones   . Frequency of urination   . TMJ (temporomandibular joint disorder)   . Diabetes mellitus without complication     borderline,no meds A1C 5.7 on 10-20-2014  . Complication of anesthesia     hypotension,tmj,DIFFICULT TILT HEAD, lacking pituitary gland, pt. concerned about temperature regulation     Past Surgical History  Procedure Laterality Date  . Spine surgery    . Rotator cuff repair    . Posterior cervical fusion/foraminotomy N/A 06/07/2013    Procedure: Exploration of posterior Cervical six-seven Fusion with Cervical seven thoracic-one posterior cervical decompression/fusion;  Surgeon: Erline Levine, MD;  Location: [facility];  Service: Neurosurgery;  Laterality: N/A;  . Ulnar nerve transposition Right 06/07/2013    Procedure: ULNAR NERVE DECOMPRESSION/TRANSPOSITION;  Surgeon: Erline Levine, MD;  Location: [facility];  Service: Neurosurgery;  Laterality: Right;  . Brain surgery      2000   mc, removed pituitary gland  . Posterior lumbar fusion 4 level N/A 10/26/2014    Procedure: Lumbar two-three, Lumba three-four Posterior Lateral Arthrodesis, Lumbar four-five Thoracolumbar Interbody Fusion, Lumbar five-Sacral one Posterior lumbar interbody fusion;  Surgeon: Erline Levine, MD;  Location: [facility];  Service: Neurosurgery;  Laterality: N/A;  L2-3 L3-4 L4-5 L5-S1 Posterior lumbar interbody fusion  . Lumbar wound debridement N/A 11/28/2014    Procedure: LUMBAR WOUND DEBRIDEMENT;  Surgeon: Karie Chimera, MD;  Location: [facility];  Service: Neurosurgery;  Laterality: N/A;     Allergies  Allergen Reactions  . Accupril [Quinapril Hcl]   . Niacin And Related     Medications:  Scheduled: . atorvastatin  10 mg Oral q1800  . cefTRIAXone (ROCEPHIN)  IV  1 g Intravenous Q24H  . desmopressin  0.2 mg Oral TID  . docusate sodium  100 mg Oral BID  . DULoxetine  60 mg Oral Daily  . hydrocortisone  5 mg Oral Daily  . levothyroxine  150 mcg Oral QAC breakfast  . losartan  100 mg Oral Daily  . OxyCODONE  30 mg Oral Q12H  . pantoprazole  40 mg Oral  Q1200  . rifampin  300 mg Oral Q12H  . sodium chloride  3 mL Intravenous Q12H  . tamsulosin  0.4 mg Oral QPC supper  . traZODone  100 mg Oral QHS  . vancomycin  1,500 mg Intravenous Q12H    Abtx:  Anti-infectives    Start     Dose/Rate Route Frequency Ordered Stop   12/01/14 2200  vancomycin (VANCOCIN) 1,500 mg in sodium chloride 0.9 % 500 mL IVPB     1,500 mg 250 mL/hr over 120 Minutes Intravenous Every 12 hours 12/01/14 1133     12/01/14 1000  rifampin (RIFADIN) capsule 300 mg     300 mg Oral Every 12 hours 12/01/14 0827      11/29/14 1000  cefTRIAXone (ROCEPHIN) 1 g in dextrose 5 % 50 mL IVPB - Premix     1 g 100 mL/hr over 30 Minutes Intravenous Every 24 hours 11/29/14 0835     11/28/14 2200  vancomycin (VANCOCIN) 1,250 mg in sodium chloride 0.9 % 250 mL IVPB  Status:  Discontinued     1,250 mg 166.7 mL/hr over 90 Minutes Intravenous Every 12 hours 11/28/14 2147 12/01/14 1133   11/28/14 1930  vancomycin (VANCOCIN) 1 GM/200ML IVPB    Comments:  [name], [name]   : cabinet override      11/28/14 1930 11/29/14 0744   11/28/14 1911  bacitracin 50,000 Units in sodium chloride irrigation 0.9 % 500 mL irrigation  Status:  Discontinued       As needed 11/28/14 1911 11/28/14 2009      Total days of antibiotics:           Social History:  reports that he has never smoked. He has never used smokeless tobacco. He reports that he drinks alcohol. He reports that he does not use illicit drugs.  Family History  Problem Relation Age of Onset  . Hypertension Other   . Obesity Other   . Heart attack Other     General ROS: no paresthesias, no change in BM, normal urination, no f/c. no problems with anbx, see HPI.   Blood pressure 123/64, pulse 78, temperature 97.8 F (36.6 C), temperature source Oral, resp. rate 20, height _0  (1.727 m), weight 87.6 kg (193 lb 2 oz), SpO2 98 %. General appearance: alert, cooperative and no distress Eyes: negative findings: conjunctivae and sclerae normal and pupils equal, round, reactive to light and accomodation Throat: normal findings: oropharynx pink & moist without lesions or evidence of thrush Neck: no adenopathy and supple, symmetrical, trachea midline Lungs: clear to auscultation bilaterally Abdomen: normal findings: bowel sounds normal and soft, non-tender Extremities: edema none. RUE PIC is clean.  Neurologic: Sensory: normal light touch BLE   Results for orders placed or performed during the hospital encounter of 11/28/14 (from the past 48 hour(s))  Vancomycin, trough      Status: Abnormal   Collection Time: 12/07/14 11:15 AM  Result Value Ref Range   Vancomycin Tr 34.7 (HH) 10.0 - 20.0 ug/mL    Comment: REPEATED TO VERIFY CRITICAL RESULT CALLED TO, READ BACK BY AND VERIFIED WITH: Clyda Hurdle AT 1232 12/07/14 BY ZBEECH.   Basic metabolic panel     Status: Abnormal   Collection Time: 12/07/14 11:15 AM  Result Value Ref Range   Sodium 137 135 - 145 mmol/L   Potassium 3.6 3.5 - 5.1 mmol/L   Chloride 98 96 - 112 mmol/L   CO2 34 (H) 19 - 32 mmol/L   Glucose, Bld 116 (  H) 70 - 99 mg/dL   BUN 8 6 - 23 mg/dL   Creatinine, Ser 0.73 0.50 - 1.35 mg/dL   Calcium 8.9 8.4 - 10.5 mg/dL   GFR calc non Af Amer >90 >90 mL/min   GFR calc Af Amer >90 >90 mL/min    Comment: (NOTE) The eGFR has been calculated using the CKD EPI equation. This calculation has not been validated in all clinical situations. eGFR's persistently <90 mL/min signify possible Chronic Kidney Disease.    Anion gap 5 5 - 15      Component Value Date/Time   SDES WOUND 11/28/2014 1902   SDES WOUND 11/28/2014 1902   SDES WOUND 11/28/2014 1902   SPECREQUEST LUMBAR SPECIMEN A 11/28/2014 1902   SPECREQUEST LUMBAR WOUND SPECIMEN A 11/28/2014 1902   SPECREQUEST LUMBAR WOUND SPECIMEN A 11/28/2014 1902   CULT  11/28/2014 1902    NO ANAEROBES ISOLATED Performed at [facility]  11/28/2014 1902    NO GROWTH 2 DAYS Performed at [facility] 12/03/2014 FINAL 11/28/2014 1902   REPTSTATUS 11/28/2014 FINAL 11/28/2014 1902   REPTSTATUS 12/01/2014 FINAL 11/28/2014 1902   No results found. Recent Results (from the past 240 hour(s))  Anaerobic culture     Status: None   Collection Time: 11/28/14  7:02 PM  Result Value Ref Range Status   Specimen Description WOUND  Final   Special Requests LUMBAR SPECIMEN A  Final   Gram Stain   Final    ABUNDANT WBC PRESENT,BOTH PMN AND MONONUCLEAR NO SQUAMOUS EPITHELIAL CELLS SEEN NO ORGANISMS SEEN Performed at [facility]    Culture   Final    NO ANAEROBES ISOLATED Performed at [facility]    Report Status 12/03/2014 FINAL  Final  Gram stain     Status: None   Collection Time: 11/28/14  7:02 PM  Result Value Ref Range Status   Specimen Description WOUND  Final   Special Requests LUMBAR WOUND SPECIMEN A  Final   Gram Stain   Final    ABUNDANT WBC PRESENT,BOTH PMN AND MONONUCLEAR NO ORGANISMS SEEN    Report Status 11/28/2014 FINAL  Final  Wound culture     Status: None   Collection Time: 11/28/14  7:02 PM  Result Value Ref Range Status   Specimen Description WOUND  Final   Special Requests LUMBAR WOUND SPECIMEN A  Final   Gram Stain   Final    ABUNDANT WBC PRESENT,BOTH PMN AND MONONUCLEAR NO SQUAMOUS EPITHELIAL CELLS SEEN NO ORGANISMS SEEN Performed at [facility]    Culture   Final    NO GROWTH 2 DAYS Performed at [facility]    Report Status 12/01/2014 FINAL  Final      12/07/2014, 3:12 PM     LOS: 9 days

## 2014-12-07 NOTE — Progress Notes (Signed)
Physical Therapy Treatment Patient Details Name: Troy Collier MRN: [id number] DOB: 04/26/1946 Today's Date: 12/07/2014    History of Present Illness 69 yo male who had multi level fusion about 5 weeks ago. He was doing well until 4 days ago when he developed progressive back pain and decreasing ability to ambulate. He came to ED today where he had an MRI of the back that showed a huge fluid collection c/w either an abscess or hematoma with marked neural compression. Patient s/p lumbar wound debridement     PT Comments    Patient steady with progress towards PT goals, ambulating with close supervision and 2 standing rest breaks. Encouraged continue OOB mobility with staff. Frequency updated to 3x wk as patient demonstrating good carryover of education and is mobilizing fairly well. Will see as indicated and progress as tolerated.   Follow Up Recommendations  SNF     Equipment Recommendations  None recommended by PT    Recommendations for Other Services       Precautions / Restrictions Precautions Precautions: Back Precaution Booklet Issued: Yes (comment) Precaution Comments: reviewed Required Braces or Orthoses: Spinal Brace Spinal Brace: Lumbar corset;Applied in sitting position Restrictions Weight Bearing Restrictions: No    Mobility  Bed Mobility Overal bed mobility: Needs Assistance Bed Mobility: Rolling;Sidelying to Sit Rolling: Supervision Sidelying to sit: Supervision;HOB elevated     Sit to sidelying: Supervision General bed mobility comments: No physical assist required  Transfers Overall transfer level: Needs assistance Equipment used: Rolling walker (2 wheeled) Transfers: Sit to/from Stand Sit to Stand: Supervision         General transfer comment: Patient cued for posture and positioning, pt prefers to use hands on RW to power up despite cues.  Ambulation/Gait Ambulation/Gait assistance: Supervision Ambulation Distance (Feet): 210 Feet Assistive  device: Rolling walker (2 wheeled)   Gait velocity: improving cadence   General Gait Details: Patient with improved cadence this session. Ambulated in hall, 2 standing rest breaks, increased distance over previous session.    Stairs            Wheelchair Mobility    Modified Rankin (Stroke Patients Only)       Balance     Sitting balance-Leahy Scale: Good       Standing balance-Leahy Scale: Fair                      Cognition Arousal/Alertness: Awake/alert Behavior During Therapy: WFL for tasks assessed/performed Overall Cognitive Status: Within Functional Limits for tasks assessed                      Exercises      General Comments        Pertinent Vitals/Pain Pain Assessment: 0-10 Pain Score: 7  Pain Location: back Pain Descriptors / Indicators: Aching Pain Intervention(s): Limited activity within patient's tolerance;Monitored during session;Patient requesting pain meds-RN notified;RN gave pain meds during session    Home Living                      Prior Function            PT Goals (current goals can now be found in the care plan section) Acute Rehab PT Goals PT Goal Formulation: With patient Time For Goal Achievement: 12/19/14 Potential to Achieve Goals: Good Progress towards PT goals: Progressing toward goals    Frequency  Min 3X/week    PT Plan Current plan remains appropriate  Co-evaluation             End of Session Equipment Utilized During Treatment: Back brace Activity Tolerance: Patient limited by pain Patient left: in bed;with call bell/phone within reach;with bed alarm set     Time: 1019-1040 PT Time Calculation (min) (ACUTE ONLY): 21 min  Charges:  $Gait Training: 8-22 mins                    G CodesFabio Asa Dec 19, 2014, 10:51 AM Charlotte Crumb, PT DPT  [phone]

## 2014-12-07 NOTE — Clinical Social Work Note (Signed)
Clinical Social Worker [facility] will return to [facility] once medically stable for discharge. CSW remains available.  FL-2 on chart for MD signature.   Derenda FennelBashira Sarah-Jane Nazario, MSW, LCSWA [phone] 12/07/2014 1:02 PM

## 2014-12-07 NOTE — Progress Notes (Signed)
ANTIBIOTIC CONSULT NOTE - FOLLOW UP  Pharmacy Consult for Vancomycin Indication: Epidural abscess  Allergies  Allergen Reactions  . Accupril [Quinapril Hcl]   . Niacin And Related     Patient Measurements: Height:  (172.7 cm) Weight: 193 lb 2 oz (87.6 kg) IBW/kg (Calculated) : 68.4  Vital Signs: Temp: 98.2 F (36.8 C) (04/07 0931) Temp Source: Oral (04/07 0931) BP: 136/70 mmHg (04/07 0931) Pulse Rate: 88 (04/07 0931) Intake/Output from previous day: 04/06 0701 - 04/07 0700 In: -  Out: 50 [Drains:50] Intake/Output from this shift:    Labs:  Recent Labs  12/07/14 1115  CREATININE 0.73   Estimated Creatinine Clearance: 96.4 mL/min (by C-G formula based on Cr of 0.73).  Recent Labs  12/07/14 1115  VANCOTROUGH 34.7*     Microbiology: Recent Results (from the past 720 hour(s))  Anaerobic culture     Status: None   Collection Time: 11/28/14  7:02 PM  Result Value Ref Range Status   Specimen Description WOUND  Final   Special Requests LUMBAR SPECIMEN A  Final   Gram Stain   Final    ABUNDANT WBC PRESENT,BOTH PMN AND MONONUCLEAR NO SQUAMOUS EPITHELIAL CELLS SEEN NO ORGANISMS SEEN Performed at [facility]    Culture   Final    NO ANAEROBES ISOLATED Performed at [facility]    Report Status 12/03/2014 FINAL  Final  Gram stain     Status: None   Collection Time: 11/28/14  7:02 PM  Result Value Ref Range Status   Specimen Description WOUND  Final   Special Requests LUMBAR WOUND SPECIMEN A  Final   Gram Stain   Final    ABUNDANT WBC PRESENT,BOTH PMN AND MONONUCLEAR NO ORGANISMS SEEN    Report Status 11/28/2014 FINAL  Final  Wound culture     Status: None   Collection Time: 11/28/14  7:02 PM  Result Value Ref Range Status   Specimen Description WOUND  Final   Special Requests LUMBAR WOUND SPECIMEN A  Final   Gram Stain   Final    ABUNDANT WBC PRESENT,BOTH PMN AND MONONUCLEAR NO SQUAMOUS EPITHELIAL CELLS SEEN NO ORGANISMS  SEEN Performed at [facility]    Culture   Final    NO GROWTH 2 DAYS Performed at [facility]    Report Status 12/01/2014 FINAL  Final    Anti-infectives    Start     Dose/Rate Route Frequency Ordered Stop   12/01/14 2200  vancomycin (VANCOCIN) 1,500 mg in sodium chloride 0.9 % 500 mL IVPB     1,500 mg 250 mL/hr over 120 Minutes Intravenous Every 12 hours 12/01/14 1133     12/01/14 1000  rifampin (RIFADIN) capsule 300 mg     300 mg Oral Every 12 hours 12/01/14 0827     11/29/14 1000  cefTRIAXone (ROCEPHIN) 1 g in dextrose 5 % 50 mL IVPB - Premix     1 g 100 mL/hr over 30 Minutes Intravenous Every 24 hours 11/29/14 0835     11/28/14 2200  vancomycin (VANCOCIN) 1,250 mg in sodium chloride 0.9 % 250 mL IVPB  Status:  Discontinued     1,250 mg 166.7 mL/hr over 90 Minutes Intravenous Every 12 hours 11/28/14 2147 12/01/14 1133   11/28/14 1930  vancomycin (VANCOCIN) 1 GM/200ML IVPB    Comments:  [name], [name]   : cabinet override      11/28/14 1930 11/29/14 0744   11/28/14 1911  bacitracin 50,000 Units in  sodium chloride irrigation 0.9 % 500 mL irrigation  Status:  Discontinued       As needed 11/28/14 1911 11/28/14 2009      Assessment: 68 YOM who continues on Vancomycin for treatment of epidural abscess. A Vancomycin trough was drawn this morning however was taken after the dose was hung and resulted as expected, SUPRAtherapeutic at 34.7 mcg/ml. Renal function remains stable - will attempt to check a true trough this evening to help evaluate appropriateness of the dose.   Vanc 3/29>> * 4/1 VT = 13.1 mcg/mL on 1250mg  q12 >> 1500mg  q12 CTX 3/30>>  3/29 Wound - NGTD  Goal of Therapy:  Vancomycin trough level 15-20 mcg/ml  Plan:  1. Continue Vancomycin 1500 mg IV every 12 hours 2. Will continue to follow renal function, culture results, LOT, and antibiotic de-escalation plans   Georgina PillionElizabeth Sevana Grandinetti, PharmD, BCPS Clinical Pharmacist Pager: [phone] 12/07/2014  1:52 PM

## 2014-12-08 MED ORDER — RIFAMPIN 300 MG PO CAPS: 300.0000 mg | ORAL_CAPSULE | Freq: Two times a day (BID) | ORAL | Status: DC

## 2014-12-08 MED ORDER — GABAPENTIN 100 MG PO CAPS
100.0000 mg | ORAL_CAPSULE | Freq: Three times a day (TID) | ORAL | Status: DC
  Administered 2014-12-08: 100 mg via ORAL
  Filled 2014-12-08: qty 1

## 2014-12-08 MED ORDER — CEFTRIAXONE SODIUM IN DEXTROSE 20 MG/ML IV SOLN: 1.0000 g | INTRAVENOUS | Status: DC

## 2014-12-08 MED ORDER — OXYCODONE HCL ER 10 MG PO T12A
40.0000 mg | EXTENDED_RELEASE_TABLET | Freq: Two times a day (BID) | ORAL | Status: DC
  Administered 2014-12-08: 40 mg via ORAL
  Filled 2014-12-08: qty 4

## 2014-12-08 MED ORDER — VANCOMYCIN HCL 10 G IV SOLR: 1500.0000 mg | Freq: Two times a day (BID) | INTRAVENOUS | Status: DC

## 2014-12-08 NOTE — Clinical Social Work Note (Signed)
Clinical Social Worker facilitated patient discharge including contacting patient family and facility to confirm patient discharge plans.  Clinical information faxed to facility and family agreeable with plan.  CSW arranged ambulance transport via [facility] to [facility] .  RN to call report prior to discharge.  DC packet on chart.   Clinical Social Worker will sign off for now as social work intervention is no longer needed. Please consult us again if new need arises.  Derenda FennelBashira Belkys Henault, MSW, LCSWA [phone] 12/08/2014 8:48 AM

## 2014-12-08 NOTE — Progress Notes (Signed)
Pt is being discharge to SNF. Discharge packages were given to transporter

## 2014-12-08 NOTE — Discharge Summary (Signed)
Physician Discharge Summary  Patient ID: Troy Collier MRN: [id number] DOB/AGE: [birth year] 69 y.o.  Admit date: 11/28/2014 Discharge date: 12/08/2014  Admission Diagnoses: Epidural abscess  Discharge Diagnoses: Epidural abscess, s/p exploration of wound with evacuation. Active Problems:   Abscess in epidural space of lumbar spine   Discharged Condition: good  Hospital Course: Troy Collier was admitted through the ED on 11/28/14 with exacerbation of lumbar & BLE pain and weakness after previously unremarkable five weeks recovery with improvement post-op L2-S1 decompression and fusion 10/26/14.  MRI revealed fluid collection. Pt was taken to surgery by Dr. Gerlene Fee for exploration and evacuation of epidural abscess. Pt has improved steadily, with titration of pain medications and initiation of IVAB.   Consults: ID  Significant Diagnostic Studies: radiology: X-Ray: intra-operative  Treatments: surgery: Exploration of lumbar wound with evacuation of epidural abscess   Discharge Exam: Blood pressure 130/62, pulse 71, temperature 98.4 F (36.9 C), temperature source Oral, resp. rate 18, height  (1.727 m), weight 87.6 kg (193 lb 2 oz), SpO2 94 %. Alert, conversant. Good strength BLE. Drsg intact. Incision without erythema, swelling, or drainage. Hemovac patent - serosanguineous drainage. Pain level improved but not as well controlled as he would like. Pt aware of ID's recommendation to continue IVAB x6wks. Vancomycin  IV q12hrs, Rifampin  po q12hrs, Rocephin 1gm IV q24hrs.  Continue to mobilize while awaiting SNF. Leave Hemovac until bed available, then pull before discharge per Dr.Stern. IVAB will continue for 6weeks. Office f/u in 3-4 weeks. Per Dr. Venetia Maxon, increase OxyContin to  q12hrs and add Neurontin  TID po. (orders entered) Appreciate Dr. Moshe Cipro assistance.    Disposition: 03-Skilled Nursing Facility     Medication List    ASK your doctor about these  medications        acetaminophen-codeine 300-30 MG per tablet  Commonly known as:  TYLENOL #3  Take 1 tablet by mouth every 4 (four) hours as needed for pain (can take twice a day).     atorvastatin 10 MG tablet  Commonly known as:  LIPITOR  Take 10 mg by mouth daily.     calcium carbonate 600 MG Tabs tablet  Commonly known as:  OS-CAL  Take 600 mg by mouth 2 (two) times daily with a meal.     cyclobenzaprine 10 MG tablet  Commonly known as:  FLEXERIL  Take 10 mg by mouth 3 (three) times daily as needed for muscle spasms.     desmopressin 0.2 MG tablet  Commonly known as:  DDAVP  TAKE 2 TABLETS (0.4MG ) 3   TIMES DAILY     DULoxetine 60 MG capsule  Commonly known as:  CYMBALTA  Take 60 mg by mouth daily.     GLUCOSAMINE MSM COMPLEX PO  Take 1 tablet by mouth daily.     hydrocortisone 5 MG tablet  Commonly known as:  CORTEF  2 tabs in the AM and 1 tab at 5 pm     levothyroxine 150 MCG tablet  Commonly known as:  SYNTHROID, LEVOTHROID  Take 1 tablet (150 mcg total) by mouth daily before breakfast.     lidocaine 5 %  Commonly known as:  LIDODERM  Place 1 patch onto the skin daily. Remove & Discard patch within 12 hours or as directed by MD     losartan 100 MG tablet  Commonly known as:  COZAAR  Take 100 mg by mouth daily.     multivitamin with minerals Tabs tablet  Take 1 tablet by mouth  daily.     Somatropin 15 MG/1.5ML Soln  0.2 mg injection daily.     NORDITROPIN FLEXPRO 5 MG/1.5ML Soln  Generic drug:  Somatropin  DIAL DOSE OF 0.35MG  ON PEN DEVICE AND INJECT SUBCUTANEOUSLY 7 DAYS A WEEK. STORE UNDER REFRIGERATION. EXPIRES 28 DAYS AFTER INITIAL USE.     oxyCODONE-acetaminophen 5-325 MG per tablet  Commonly known as:  PERCOCET/ROXICET  Take 1-2 tablets by mouth every 6 (six) hours as needed for moderate pain or severe pain.     psyllium 28 % packet  Commonly known as:  METAMUCIL SMOOTH TEXTURE  Take 1 packet by mouth 2 (two) times daily.     tamsulosin 0.4  MG Caps capsule  Commonly known as:  FLOMAX  Take 0.4 mg by mouth daily after supper.     Testosterone 30 MG/ACT Soln  Commonly known as:  AXIRON  Place 30 mg onto the skin 4 (four) times daily.     traZODone 100 MG tablet  Commonly known as:  DESYREL  Take 100 mg by mouth at bedtime.     vitamin C 500 MG tablet  Commonly known as:  ASCORBIC ACID  Take 500 mg by mouth 2 (two) times daily.     Vitamin D 2000 UNITS tablet  Take 2,000 Units by mouth daily.         Signed: Georgiann Cockeroteat, Avonna Iribe 12/08/2014, 8:07 AM

## 2014-12-08 NOTE — Progress Notes (Signed)
Occupational Therapy Treatment Patient Details Name: Troy Collier MRN: [id number] DOB: 09/01/1945 Today's Date: 12/08/2014    History of present illness 69 yo male who had multi level fusion about 5 weeks ago. He was doing well until 4 days ago when he developed progressive back pain and decreasing ability to ambulate. He came to ED today where he had an MRI of the back that showed a huge fluid collection c/w either an abscess or hematoma with marked neural compression. Patient s/p lumbar wound debridement    OT comments  Patient progressing towards OT goals, continue plan of care for now. Patient with increased independence with mobility, supervision for sit<>stands and functional mobility at this time. Feel patient continues to need/will benefit from rehab prior to discharging home alone.    Follow Up Recommendations  SNF;Supervision/Assistance - 24 hour    Equipment Recommendations  None recommended by OT    Recommendations for Other Services  None at this time   Precautions / Restrictions Precautions Precautions: Back Precaution Comments: Patient able to verbalize 3/3 back precautions Required Braces or Orthoses: Spinal Brace Spinal Brace: Lumbar corset;Applied in sitting position Restrictions Weight Bearing Restrictions: No    Mobility Bed Mobility Overal bed mobility: Needs Assistance Bed Mobility: Rolling;Sidelying to Sit Rolling: Supervision Sidelying to sit: Supervision;HOB elevated       General bed mobility comments: No physical assist required  Transfers Overall transfer level: Needs assistance Equipment used: Rolling walker (2 wheeled) Transfers: Sit to/from Stand Sit to Stand: Supervision         General transfer comment: Cues required for hand placement, safety, technique. Had to reiterate these at end of session when patient performed stand>sit.     Balance Overall balance assessment: Needs assistance Sitting-balance support: No upper extremity  supported;Feet supported Sitting balance-Leahy Scale: Good     Standing balance support: Bilateral upper extremity supported;During functional activity Standing balance-Leahy Scale: Good   ADL General ADL Comments: Patient stated he understood AE and how to use the equipment. Patient engaged in bed mobility, sat EOB to don brace, then engaged in functional ambulation with RW. Patien trequired cues for safe sit<>stand transfers. At end of session, left patient seated in recliner.      Cognition Behavior During Therapy: WFL for tasks assessed/performed Overall Cognitive Status: Within Functional Limits for tasks assessed                Pertinent Vitals/ Pain       Pain Assessment: 0-10 Pain Score: 6  Pain Location: back Pain Descriptors / Indicators: Aching Pain Intervention(s): Monitored during session;Repositioned         Frequency Min 2X/week     Progress Toward Goals  OT Goals(current goals can now be found in the care plan section)  Progress towards OT goals: Progressing toward goals     Plan Discharge plan remains appropriate       End of Session Equipment Utilized During Treatment: Rolling walker;Back brace   Activity Tolerance Patient tolerated treatment well   Patient Left in chair;with call bell/phone within reach;with chair alarm set     Time: 1140-1157 OT Time Calculation (min): 17 min  Charges: OT Treatments $Therapeutic Activity: 8-22 mins  Troy Collier , MS, OTR/L, CLT Pager: [phone]  12/08/2014, 1:05 PM

## 2014-12-08 NOTE — Progress Notes (Signed)
Subjective: Patient reports "I feel ok .Marland Kitchen.Marland Kitchen.I still have maybe moderate pain all the time"  Objective: Vital signs in last 24 hours: Temp:  [97.8 F (36.6 C)-98.6 F (37 C)] 98.4 F (36.9 C) (04/08 0605) Pulse Rate:  [71-88] 71 (04/08 0605) Resp:  [17-20] 18 (04/08 0605) BP: (123-148)/(62-77) 130/62 mmHg (04/08 0605) SpO2:  [94 %-100 %] 94 % (04/08 0605)  Intake/Output from previous day:   Intake/Output this shift:    Alert, conversant. Good strength BLE. Drsg intact. Incision without erythema, swelling, or drainage. Hemovac patent - serosanguineous drainage. Pain level improved but not as well controlled as he would like. Pt aware of ID's recommendation to continue IVAB x6wks. Vancomycin 1500mg  IV q12hrs, Rifampin 300mg  po q12hrs, Rocephin 1gm IV q24hrs  Lab Results: No results for input(s): WBC, HGB, HCT, PLT in the last 72 hours. BMET  Recent Labs  12/07/14 1115  NA 137  K 3.6  CL 98  CO2 34*  GLUCOSE 116*  BUN 8  CREATININE 0.73  CALCIUM 8.9    Studies/Results: No results found.  Assessment/Plan: Improving   LOS: 10 days  Continue to mobilize while awaiting SNF. Leave Hemovac until bed available, then pull before discharge per Dr.Stern. IVAB will continue for 6weeks. Office f/u in 3-4 weeks. Per Dr. Venetia MaxonStern, increase OxyContin to 40mg  q12hrs and add Neurontin 100mg  TID po. (orders entered) Appreciate Dr. Moshe CiproHatcher's assistance.   Troy Collier, Troy Collier 12/08/2014, 7:54 AM

## 2014-12-11 NOTE — Telephone Encounter (Signed)
error 

## 2015-01-05 ENCOUNTER — Encounter: Payer: Self-pay | Admitting: *Deleted

## 2015-01-05 NOTE — Telephone Encounter (Signed)
Error

## 2015-01-10 ENCOUNTER — Ambulatory Visit (INDEPENDENT_AMBULATORY_CARE_PROVIDER_SITE_OTHER): Payer: 59 | Admitting: Internal Medicine

## 2015-01-10 VITALS — Wt 183.0 lb

## 2015-01-10 DIAGNOSIS — G062 Extradural and subdural abscess, unspecified: Secondary | ICD-10-CM | POA: Diagnosis not present

## 2015-01-10 NOTE — Progress Notes (Signed)
RFV: follow up for treatment of epidural abscess Subjective:    Patient ID: Troy Collier, male    DOB: 07/09/1946, 69 y.o.   MRN: [id number]  HPI 69 yo M who underwent L3-S1 decompression fusion with instrumentation on 10/26/14, he susbesequently had increasing low back pain at the end of March. He was admitted on 3/29 for 4 day hx of low back pain, radiculopathy. MRI imaging suggested large post-operative abscess in epidural space. He was taken to the Or on 3/29 for exploration of wound and evacuation of abscess. OR CX were no growth to date. He was started empiric regimen of vancomycin, ceftriaxone and rifampin as of April 1st 2016. He was to take Iv abtx for 6 wks.he is here in follow up.  Allergies  Allergen Reactions  . Accupril [Quinapril Hcl]   . Niacin And Related      Current Outpatient Prescriptions on File Prior to Visit  Medication Sig Dispense Refill  . acetaminophen-codeine (TYLENOL #3) 300-30 MG per tablet Take 1 tablet by mouth every 4 (four) hours as needed for pain (can take twice a day).    Marland Kitchen. atorvastatin (LIPITOR) 10 MG tablet Take 10 mg by mouth daily.    . calcium carbonate (OS-CAL) 600 MG TABS tablet Take 600 mg by mouth 2 (two) times daily with a meal.    . cefTRIAXone (ROCEPHIN) 20 MG/ML IVPB Inject 50 mLs (1 g total) into the vein daily. 500 mL 5  . Cholecalciferol (VITAMIN D) 2000 UNITS tablet Take 2,000 Units by mouth daily.    . cyclobenzaprine (FLEXERIL) 10 MG tablet Take 10 mg by mouth 3 (three) times daily as needed for muscle spasms.    Marland Kitchen. desmopressin (DDAVP) 0.2 MG tablet TAKE 2 TABLETS (0.4MG ) 3   TIMES DAILY 540 tablet 3  . DULoxetine (CYMBALTA) 60 MG capsule Take 60 mg by mouth daily.    Stephanie Acre. Glucos-MSM-C-Mn-Ginger-Willow (GLUCOSAMINE MSM COMPLEX PO) Take 1 tablet by mouth daily.    . hydrocortisone (CORTEF) 5 MG tablet 2 tabs in the AM and 1 tab at 5 pm 270 tablet 3  . levothyroxine (SYNTHROID, LEVOTHROID) 150 MCG tablet Take 1 tablet (150 mcg total) by  mouth daily before breakfast. 90 tablet 1  . lidocaine (LIDODERM) 5 % Place 1 patch onto the skin daily. Remove & Discard patch within 12 hours or as directed by MD    . losartan (COZAAR) 100 MG tablet Take 100 mg by mouth daily.    . Multiple Vitamin (MULTIVITAMIN WITH MINERALS) TABS tablet Take 1 tablet by mouth daily.    . NORDITROPIN FLEXPRO 5 MG/1.5ML SOLN DIAL DOSE OF 0.35MG  ON PEN DEVICE AND INJECT SUBCUTANEOUSLY 7 DAYS A WEEK. STORE UNDER REFRIGERATION. EXPIRES 28 DAYS AFTER INITIAL USE. 6 mL 7  . oxyCODONE-acetaminophen (PERCOCET/ROXICET) 5-325 MG per tablet Take 1-2 tablets by mouth every 6 (six) hours as needed for moderate pain or severe pain.    Marland Kitchen. psyllium (METAMUCIL SMOOTH TEXTURE) 28 % packet Take 1 packet by mouth 2 (two) times daily.    . rifampin (RIFADIN) 300 MG capsule Take 1 capsule (300 mg total) by mouth 2 (two) times daily. 80 capsule 0  . tamsulosin (FLOMAX) 0.4 MG CAPS Take 0.4 mg by mouth daily after supper.     . Testosterone (AXIRON) 30 MG/ACT SOLN Place 30 mg onto the skin 4 (four) times daily. (Patient taking differently: Place 30 mg onto the skin daily. ) 270 mL 1  . traZODone (DESYREL) 100 MG tablet Take  100 mg by mouth at bedtime.    . vancomycin 1,500 mg in sodium chloride 0.9 % 500 mL Inject 1,500 mg into the vein every 12 (twelve) hours. 1 L 42  . vitamin C (ASCORBIC ACID) 500 MG tablet Take 500 mg by mouth 2 (two) times daily.    . Somatropin 15 MG/1.5ML SOLN 0.2 mg injection daily. (Patient not taking: Reported on 11/28/2014) 4.5 mL 3   No current facility-administered medications on file prior to visit.   Active Ambulatory Problems    Diagnosis Date Noted  . Pure hypercholesterolemia 04/25/2013  . Prediabetes 04/26/2013  . 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 Essential hypertension, benign 11/04/2013  . Scoliosis of lumbar spine 10/26/2014  . Abscess in epidural space of lumbar spine 11/28/2014   Resolved Ambulatory Problems    Diagnosis Date Noted  .  Unspecified hypothyroidism 04/25/2013   Past Medical History  Diagnosis Date  . Hyperlipidemia   . Hypertension   . Diabetes insipidus   . Anxiety   . Sleep apnea   . Arthritis   . Fibromyalgia   . History of kidney stones   . Frequency of urination   . TMJ (temporomandibular joint disorder)   . Diabetes mellitus without complication   . Complication of anesthesia       Review of Systems     Objective:   Physical Exam Wt 183 lb (83.008 kg) Physical Exam  Constitutional: He is oriented to person, place, and time. He appears well-developed and well-nourished. No distress.  HENT:  Mouth/Throat: Oropharynx is clear and moist. No oropharyngeal exudate.  Neurological: He is alert and oriented to person, place, and time.  Skin: Skin is warm and dry. Surgical site is well healed Psychiatric: He has a normal mood and affect. His behavior is normal.      Lab Results  Component Value Date   ESRSEDRATE 86* 11/28/2014    Lab Results  Component Value Date   ESRSEDRATE 4 01/10/2015   Lab Results  Component Value Date   CRP <0.5 01/10/2015    Imaging: IMPRESSION: 1. L2 through S1 posterior lumbar interbody fusion. Increased endplate edema at Z6-X05-S1 relative to the other postoperative levels. Paravertebral phlegmon around L5-S1. 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 Correlation with laboratory studies recommended. 2. Critical Value/emergent results were called by telephone at the time of interpretation on 11/28/2014 at 4:44 pm to Dr. Lynelle DoctorKNAPP , who verbally acknowledged these results. 3. Large fluid collection in the laminectomy bed extending from L2 through S1, measuring over 9 cm transverse and craniocaudal. This produces mass effect on the thecal sac and compression of the thecal sac at L4-L5 and L5-S1. Differential considerations for the fluid collection are abscess, spinal leak  (pseudomeningocele) and postoperative hematoma. A combination of these is probably present based on signal characteristics.      Assessment & Plan:  Epidural abscess/post-operative surgical infection = will check sed rate, crp, cmp and cbc with diff. Will plan to have his abtx stop on 5/13.  Will call advance to discontinue picc line  If sed rate still elevated, we will switch to oral abtx and then decide if need to repeat imaging. Patient appears much improved. His inflammatory markers have normalized. Will give him 2 weeks of oral antibiotics with doxycycline to finish up total of 8 wks of therapy.

## 2015-01-11 LAB — CBC WITH DIFFERENTIAL/PLATELET
Basophils Absolute: 0.2 10*3/uL — ABNORMAL HIGH (ref 0.0–0.1)
Basophils Relative: 3 % — ABNORMAL HIGH (ref 0–1)
EOS ABS: 0.2 10*3/uL (ref 0.0–0.7)
Eosinophils Relative: 3 % (ref 0–5)
HCT: 38.4 % — ABNORMAL LOW (ref 39.0–52.0)
HEMOGLOBIN: 12.6 g/dL — AB (ref 13.0–17.0)
LYMPHS ABS: 1.7 10*3/uL (ref 0.7–4.0)
Lymphocytes Relative: 32 % (ref 12–46)
MCH: 29.4 pg (ref 26.0–34.0)
MCHC: 32.8 g/dL (ref 30.0–36.0)
MCV: 89.5 fL (ref 78.0–100.0)
MONOS PCT: 11 % (ref 3–12)
MPV: 9 fL (ref 8.6–12.4)
Monocytes Absolute: 0.6 10*3/uL (ref 0.1–1.0)
Neutro Abs: 2.8 10*3/uL (ref 1.7–7.7)
Neutrophils Relative %: 51 % (ref 43–77)
Platelets: 306 10*3/uL (ref 150–400)
RBC: 4.29 MIL/uL (ref 4.22–5.81)
RDW: 14.3 % (ref 11.5–15.5)
WBC: 5.4 10*3/uL (ref 4.0–10.5)

## 2015-01-11 LAB — BASIC METABOLIC PANEL
BUN: 14 mg/dL (ref 6–23)
CO2: 33 mEq/L — ABNORMAL HIGH (ref 19–32)
CREATININE: 0.66 mg/dL (ref 0.50–1.35)
Calcium: 9.7 mg/dL (ref 8.4–10.5)
Chloride: 95 mEq/L — ABNORMAL LOW (ref 96–112)
Glucose, Bld: 82 mg/dL (ref 70–99)
Potassium: 4.3 mEq/L (ref 3.5–5.3)
SODIUM: 138 meq/L (ref 135–145)

## 2015-01-11 LAB — C-REACTIVE PROTEIN

## 2015-01-11 LAB — SEDIMENTATION RATE: Sed Rate: 4 mm/hr (ref 0–20)

## 2015-01-12 MED ORDER — DOXYCYCLINE HYCLATE 100 MG PO TABS: 100.0000 mg | ORAL_TABLET | Freq: Two times a day (BID) | ORAL | Status: DC

## 2015-02-20 ENCOUNTER — Ambulatory Visit: Payer: 59 | Admitting: Internal Medicine

## 2015-02-23 ENCOUNTER — Other Ambulatory Visit: Payer: 59

## 2015-02-26 ENCOUNTER — Other Ambulatory Visit: Payer: Self-pay | Admitting: *Deleted

## 2015-02-26 ENCOUNTER — Other Ambulatory Visit (INDEPENDENT_AMBULATORY_CARE_PROVIDER_SITE_OTHER): Payer: 59

## 2015-02-26 DIAGNOSIS — R7303 Prediabetes: Secondary | ICD-10-CM

## 2015-02-26 DIAGNOSIS — R7309 Other abnormal glucose: Secondary | ICD-10-CM

## 2015-02-26 DIAGNOSIS — E230 Hypopituitarism: Secondary | ICD-10-CM | POA: Diagnosis not present

## 2015-02-26 LAB — URINALYSIS
Hgb urine dipstick: NEGATIVE
KETONES UR: NEGATIVE
Leukocytes, UA: NEGATIVE
Nitrite: NEGATIVE
SPECIFIC GRAVITY, URINE: 1.02 (ref 1.000–1.030)
TOTAL PROTEIN, URINE-UPE24: NEGATIVE
Urine Glucose: NEGATIVE
Urobilinogen, UA: 0.2 (ref 0.0–1.0)
pH: 6 (ref 5.0–8.0)

## 2015-02-26 LAB — COMPREHENSIVE METABOLIC PANEL
ALK PHOS: 74 U/L (ref 39–117)
ALT: 14 U/L (ref 0–53)
AST: 14 U/L (ref 0–37)
Albumin: 4 g/dL (ref 3.5–5.2)
BUN: 12 mg/dL (ref 6–23)
CALCIUM: 9.1 mg/dL (ref 8.4–10.5)
CHLORIDE: 104 meq/L (ref 96–112)
CO2: 31 mEq/L (ref 19–32)
Creatinine, Ser: 0.74 mg/dL (ref 0.40–1.50)
GFR: 111.89 mL/min (ref >60.00)
Glucose, Bld: 95 mg/dL (ref 70–99)
POTASSIUM: 3.4 meq/L — AB (ref 3.5–5.1)
Sodium: 140 mEq/L (ref 135–145)
Total Bilirubin: 0.5 mg/dL (ref 0.2–1.2)
Total Protein: 6.4 g/dL (ref 6.0–8.3)

## 2015-02-26 LAB — TESTOSTERONE: TESTOSTERONE: 814.46 ng/dL (ref 300.00–890.00)

## 2015-02-26 LAB — HEMOGLOBIN A1C: Hgb A1c MFr Bld: 5.6 % (ref 4.6–6.5)

## 2015-02-26 LAB — T4, FREE: Free T4: 0.78 ng/dL (ref 0.60–1.60)

## 2015-02-27 LAB — INSULIN-LIKE GROWTH FACTOR: Insulin-Like GF-1: 185 ng/mL (ref 47–192)

## 2015-02-28 ENCOUNTER — Encounter: Payer: Self-pay | Admitting: Endocrinology

## 2015-02-28 ENCOUNTER — Ambulatory Visit (INDEPENDENT_AMBULATORY_CARE_PROVIDER_SITE_OTHER): Payer: 59 | Admitting: Endocrinology

## 2015-02-28 VITALS — BP 146/80 | HR 91 | Temp 97.9°F | Resp 14 | Ht 67.0 in | Wt 198.0 lb

## 2015-02-28 DIAGNOSIS — R7309 Other abnormal glucose: Secondary | ICD-10-CM

## 2015-02-28 DIAGNOSIS — E7800 Pure hypercholesterolemia, unspecified: Secondary | ICD-10-CM

## 2015-02-28 DIAGNOSIS — R7303 Prediabetes: Secondary | ICD-10-CM

## 2015-02-28 DIAGNOSIS — I10 Essential (primary) hypertension: Secondary | ICD-10-CM

## 2015-02-28 DIAGNOSIS — E230 Hypopituitarism: Secondary | ICD-10-CM

## 2015-02-28 DIAGNOSIS — E876 Hypokalemia: Secondary | ICD-10-CM

## 2015-02-28 NOTE — Progress Notes (Signed)
Patient ID: Troy Collier, male   DOB: 11/11/1945, 69 y.o.   MRN: [id number]   Chief complaint: Followup of hypopituitarism  History of Present Illness:  He has had successful pituitary surgery in 09/1997.  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 His levels have been variable but is now requiring relatively low doses. He is  taking 0.35 mg daily although has been on as low dose as 0.2 mg previously.  Currently on Norditropin brand and his brand name is changed periodically by insurance based on their preference.  Is compliant with his injections daily.  He does tend to feel tired recently but also has had back surgery and is still recovering from this.  Last IGF 1 level in 12/15 was 168 and now it is supper normal from a different lab at 185  2. Hypothyroidism: Has been on relatively stable doses of thyroid supplement since his surgery.  His free T4 on his last visit was quite normal at 1.02 but recently it is lower at 0.78.  He does have some fatigue since his surgery but no cord intolerance, also has lost some weight.  No daytime somnolence Has been compliant with his medication  Lab Results  Component Value Date   FREET4 0.78 02/26/2015   FREET4 1.02 08/21/2014   FREET4 0.49* 05/04/2014   TSH 0.10* 04/26/2013    3. Hypogonadism: He is currently taking Axiron, 2 pumps under each arm with normal libido and energy level. He is now taking the Axiron as directed in his axilla and applying his deodorant afterwards, previously was not using the right technique or location.  His level is however progressively increasing and now high normal.   Lab Results  Component Value Date   TESTOSTERONE 814.46 02/26/2015    4.  Adrenal insufficiency: He has been on stable doses of hydrocortisone. He has been taking only 15 mg hydrocortisone a day with no symptoms of adrenal  insufficiency.  No recent nausea although had been feeling a little nauseated and having malaise when he was in the rehabilitation after his surgery and apparently he was given a higher dose of hydrocortisone with relief.  He had been instructed on increasing the dose for stress on his last visit.  Also apparently got hypotensive during his low back surgery; he did do well with the second surgery for abscess and was apparently given stress doses of steroids 5. Diabetes insipidus: He continues to take 2 tablets 3 times a day, usually every 8 hours.    He tends to have increased urinary frequency and thirst if he takes less than 6 tablets a day or if he is late in taking his medications.  Urine specific gravity is 1020 and his sodium is now normal at 140  OTHER problems:  He has had impaired fasting glucose and IFG and this has been managed with diet and exercise alone. Recent fasting blood sugar is normal, has lost weight since his low back surgery A1c is excellent at 5.6 now Has not been able to resume exercise as yet  Wt Readings from Last 3 Encounters:  02/28/15 198 lb (89.812 kg)  01/10/15 183 lb (83.008 kg)  11/30/14 193 lb 2 oz (87.6 kg)    Lab Results  Component Value Date   HGBA1C 5.6 02/26/2015   HGBA1C 6.1 05/04/2014   HGBA1C 6.1 04/28/2013  Lab Results  Component Value Date   LDLCALC 54 04/26/2013   CREATININE 0.74 02/26/2015    Dyslipidemia/metabolic syndrome: He is on Lipitor, tends to have relatively low HDL and is followed by PCP   Lab Results  Component Value Date   CHOL 110 04/26/2013   HDL 34.90* 04/26/2013   LDLCALC 54 04/26/2013   TRIG 108.0 04/26/2013   CHOLHDL 3 04/26/2013     LABS:    Lab on 02/26/2015  Component Date Value Ref Range Status  . Hgb A1c MFr Bld 02/26/2015 5.6  4.6 - 6.5 % Final   Glycemic Control Guidelines for People with Diabetes:Non Diabetic:  <6%Goal of Therapy: <7%Additional Action Suggested:  >8%   . Sodium 02/26/2015 140  135  - 145 mEq/L Final  . Potassium 02/26/2015 3.4* 3.5 - 5.1 mEq/L Final  . Chloride 02/26/2015 104  96 - 112 mEq/L Final  . CO2 02/26/2015 31  19 - 32 mEq/L Final  . Glucose, Bld 02/26/2015 95  70 - 99 mg/dL Final  . BUN 09/81/191406/27/2016 12  6 - 23 mg/dL Final  . Creatinine, Ser 02/26/2015 0.74  0.40 - 1.50 mg/dL Final  . Total Bilirubin 02/26/2015 0.5  0.2 - 1.2 mg/dL Final  . Alkaline Phosphatase 02/26/2015 74  39 - 117 U/L Final  . AST 02/26/2015 14  0 - 37 U/L Final  . ALT 02/26/2015 14  0 - 53 U/L Final  . Total Protein 02/26/2015 6.4  6.0 - 8.3 g/dL Final  . Albumin 78/29/562106/27/2016 4.0  3.5 - 5.2 g/dL Final  . Calcium 30/86/578406/27/2016 9.1  8.4 - 10.5 mg/dL Final  . GFR 69/62/952806/27/2016 111.89  >60.00 mL/min Final  . Free T4 02/26/2015 0.78  0.60 - 1.60 ng/dL Final  . Testosterone 41/32/440106/27/2016 814.46  300.00 - 890.00 ng/dL Final  . Insulin-Like GF-1 02/26/2015 185  47 - 192 ng/mL Final  . Color, Urine 02/26/2015 YELLOW  Yellow;Lt. Yellow Final  . APPearance 02/26/2015 CLEAR  Clear Final  . Specific Gravity, Urine 02/26/2015 1.020  1.000-1.030 Final  . pH 02/26/2015 6.0  5.0 - 8.0 Final  . Total Protein, Urine 02/26/2015 NEGATIVE  Negative Final  . Urine Glucose 02/26/2015 NEGATIVE  Negative Final  . Ketones, ur 02/26/2015 NEGATIVE  Negative Final  . Bilirubin Urine 02/26/2015 SMALL* Negative Final  . Hgb urine dipstick 02/26/2015 NEGATIVE  Negative Final  . Urobilinogen, UA 02/26/2015 0.2  0.0 - 1.0 Final  . Leukocytes, UA 02/26/2015 NEGATIVE  Negative Final  . Nitrite 02/26/2015 NEGATIVE  Negative Final       Medication List       This list is accurate as of: 02/28/15  8:37 PM.  Always use your most recent med list.               acetaminophen-codeine 300-30 MG per tablet  Commonly known as:  TYLENOL #3  Take 1 tablet by mouth every 4 (four) hours as needed for pain (can take twice a day).     atorvastatin 10 MG tablet  Commonly known as:  LIPITOR  Take 10 mg by mouth daily.     calcium  carbonate 600 MG Tabs tablet  Commonly known as:  OS-CAL  Take 600 mg by mouth 2 (two) times daily with a meal.     cyclobenzaprine 10 MG tablet  Commonly known as:  FLEXERIL  Take 10 mg by mouth 3 (three) times daily as needed for muscle spasms.     desmopressin 0.2 MG  tablet  Commonly known as:  DDAVP  TAKE 2 TABLETS (0.4MG ) 3   TIMES DAILY     DULoxetine 60 MG capsule  Commonly known as:  CYMBALTA  Take 60 mg by mouth daily.     gabapentin 100 MG capsule  Commonly known as:  NEURONTIN  TAKE ONE CAPSULE 3 TIMES A DAY     GLUCOSAMINE MSM COMPLEX PO  Take 1 tablet by mouth daily.     hydrocortisone 5 MG tablet  Commonly known as:  CORTEF  2 tabs in the AM and 1 tab at 5 pm     levothyroxine 150 MCG tablet  Commonly known as:  SYNTHROID, LEVOTHROID  Take 1 tablet (150 mcg total) by mouth daily before breakfast.     lidocaine 5 %  Commonly known as:  LIDODERM  Place 1 patch onto the skin daily. Remove & Discard patch within 12 hours or as directed by MD     losartan 100 MG tablet  Commonly known as:  COZAAR  Take 100 mg by mouth daily.     multivitamin with minerals Tabs tablet  Take 1 tablet by mouth daily.     NORDITROPIN FLEXPRO 5 MG/1.5ML Soln  Generic drug:  Somatropin  DIAL DOSE OF 0.35MG  ON PEN DEVICE AND INJECT SUBCUTANEOUSLY 7 DAYS A WEEK. STORE UNDER REFRIGERATION. EXPIRES 28 DAYS AFTER INITIAL USE.     oxyCODONE-acetaminophen 5-325 MG per tablet  Commonly known as:  PERCOCET/ROXICET  Take 1-2 tablets by mouth every 6 (six) hours as needed for moderate pain or severe pain.     psyllium 28 % packet  Commonly known as:  METAMUCIL SMOOTH TEXTURE  Take 1 packet by mouth 2 (two) times daily.     tamsulosin 0.4 MG Caps capsule  Commonly known as:  FLOMAX  Take 0.4 mg by mouth daily after supper.     Testosterone 30 MG/ACT Soln  Commonly known as:  AXIRON  Place 30 mg onto the skin 4 (four) times daily.     traZODone 100 MG tablet  Commonly known as:   DESYREL  Take 100 mg by mouth at bedtime.     vancomycin 1,500 mg in sodium chloride 0.9 % 500 mL  Inject 1,500 mg into the vein every 12 (twelve) hours.     vitamin C 500 MG tablet  Commonly known as:  ASCORBIC ACID  Take 500 mg by mouth 2 (two) times daily.     Vitamin D 2000 UNITS tablet  Take 2,000 Units by mouth daily.        Allergies:  Allergies  Allergen Reactions  . Accupril [Quinapril Hcl]   . Niacin And Related     Past Medical History  Diagnosis Date  . Hyperlipidemia   . Hypertension   . Diabetes insipidus   . Anxiety   . Sleep apnea     mild no cpap recommended  . Arthritis   . Fibromyalgia   . History of kidney stones   . Frequency of urination   . TMJ (temporomandibular joint disorder)   . Diabetes mellitus without complication     borderline,no meds A1C 5.7 on 10-20-2014  . Complication of anesthesia     hypotension,tmj,DIFFICULT TILT HEAD, lacking pituitary gland, pt. concerned about temperature regulation     Past Surgical History  Procedure Laterality Date  . Spine surgery    . Rotator cuff repair    . Posterior cervical fusion/foraminotomy N/A 06/07/2013    Procedure: Exploration of posterior Cervical  six-seven Fusion with Cervical seven thoracic-one posterior cervical decompression/fusion;  Surgeon: Maeola Harman, MD;  Location: [facility];  Service: Neurosurgery;  Laterality: N/A;  . Ulnar nerve transposition Right 06/07/2013    Procedure: ULNAR NERVE DECOMPRESSION/TRANSPOSITION;  Surgeon: Maeola Harman, MD;  Location: [facility];  Service: Neurosurgery;  Laterality: Right;  . Brain surgery      2000   [facility], removed pituitary gland  . Posterior lumbar fusion 4 level N/A 10/26/2014    Procedure: Lumbar two-three, Lumba three-four Posterior Lateral Arthrodesis, Lumbar four-five Thoracolumbar Interbody Fusion, Lumbar five-Sacral one Posterior lumbar interbody fusion;  Surgeon: Maeola Harman, MD;  Location: [facility];  Service: Neurosurgery;   Laterality: N/A;  L2-3 L3-4 L4-5 L5-S1 Posterior lumbar interbody fusion  . Lumbar wound debridement N/A 11/28/2014    Procedure: LUMBAR WOUND DEBRIDEMENT;  Surgeon: Aliene Beams, MD;  Location: [facility];  Service: Neurosurgery;  Laterality: N/A;    Family History  Problem Relation Age of Onset  . Hypertension Other   . Obesity Other   . Heart attack Other     Social History:  reports that he has never smoked. He has never used smokeless tobacco. He reports that he drinks alcohol. He reports that he does not use illicit drugs.  Review of Systems -   HYPERTENSION: He has been on treatment for several years, BP at home  checked periodically. Followed by PCP and he thinks blood pressure was about 120+ recently with PCP and not clear why it is higher today  History of depression treated with Cymbalta  He is still wearing a brace for his back after his surgery  EXAM:  BP 146/80 mmHg  Pulse 91  Temp(Src) 97.9 F (36.6 C)  Resp 14  Ht 5\' 7"  (1.702 m)  Wt 198 lb (89.812 kg)  BMI 31.00 kg/m2  SpO2 94%   No pallor Biceps reflexes difficult to elicit, triceps normal No puffiness of face or eyes Hands do not feel unusually cool No peripheral edema   Assessment/Plan:   PANHYPOPITUITARISM:  1. Secondary hypothyroidism: He has slightly lower free T4 with 150 g. Not clear if he is symptomatic since he has had other issues with recovering from his second back surgery.  Also has just started back full-time work.  Does not look hypothyroid and has no other symptoms.  For now will just increase his dose by half tablet weekly and recheck in 3 months 2. Cortisol deficiency: He  has previously been doing well with only small doses of supplement.  Sodium is normal.  He still thinks he has a little malaise and since he had felt better with higher dose when he was in rehabilitation will have him increase the morning dose to 15 mg for now.  Has been aware of using stress doses for  illness. 3. Growth hormone deficiency, long-term and requiring low-dose supplementation with good results  with improved energy level, still requiring relatively low dose of 0.35 mg and IGF-1 level is high normal.  He will dial the dose to 0.30 only 4. Hypogonadism: Subjectively has been doing well with supplementation and his levels have been progressively improved with using the correct technique of applying the Axiron.  However since a level is high normal will reduce his dosage to 3 pumps a day instead of 4 We'll check his level again in 3 months  Diabetes insipidus, long-standing he is quite compliant with his s DDAVP 3 times a day and sodium is normal along with  urine specific gravity He will continue with 2 tablets every 8 hours indefinitely  Impaired fasting glucose: This is normal now  Hypertension: Followed by PCP; he thinks his blood pressure may be higher today because of having some pain in his back and previously blood pressure normal  Patient Instructions  Axiron 2 pumps on 1 side and 2 on the other  Extra 1/2 pill of synthroid weekly  Norditropin 0.30 mg daily  Increase dietary potassium      Tyshon Fanning 02/28/2015, 8:37 PM

## 2015-02-28 NOTE — Patient Instructions (Addendum)
Axiron 2 pumps on 1 side and 2 on the other  Extra 1/2 pill of synthroid weekly  Norditropin 0.30 mg daily  Increase dietary potassium

## 2015-04-02 ENCOUNTER — Other Ambulatory Visit: Payer: Self-pay | Admitting: *Deleted

## 2015-04-02 ENCOUNTER — Other Ambulatory Visit: Payer: Self-pay

## 2015-04-02 ENCOUNTER — Telehealth: Payer: Self-pay | Admitting: Endocrinology

## 2015-04-02 MED ORDER — LEVOTHYROXINE SODIUM 150 MCG PO TABS: 150.0000 ug | ORAL_TABLET | Freq: Every day | ORAL | Status: DC

## 2015-04-02 NOTE — Telephone Encounter (Signed)
Patient ned a new prescription of Synthroid  150 mcg [id number] - [place], [place] - [address] [phone] (Phone) [phone] (Fax)

## 2015-05-28 ENCOUNTER — Other Ambulatory Visit (INDEPENDENT_AMBULATORY_CARE_PROVIDER_SITE_OTHER): Payer: 59

## 2015-05-28 DIAGNOSIS — E7800 Pure hypercholesterolemia, unspecified: Secondary | ICD-10-CM

## 2015-05-28 DIAGNOSIS — E230 Hypopituitarism: Secondary | ICD-10-CM

## 2015-05-28 DIAGNOSIS — R7309 Other abnormal glucose: Secondary | ICD-10-CM | POA: Diagnosis not present

## 2015-05-28 DIAGNOSIS — R7303 Prediabetes: Secondary | ICD-10-CM

## 2015-05-28 LAB — LIPID PANEL
CHOL/HDL RATIO: 3
Cholesterol: 94 mg/dL (ref 0–200)
HDL: 32.4 mg/dL — AB (ref >39.00)
LDL CALC: 45 mg/dL (ref 0–99)
NonHDL: 61.74
TRIGLYCERIDES: 86 mg/dL (ref 0.0–149.0)
VLDL: 17.2 mg/dL (ref 0.0–40.0)

## 2015-05-28 LAB — BASIC METABOLIC PANEL
BUN: 21 mg/dL (ref 6–23)
CHLORIDE: 101 meq/L (ref 96–112)
CO2: 30 mEq/L (ref 19–32)
Calcium: 9.4 mg/dL (ref 8.4–10.5)
Creatinine, Ser: 0.86 mg/dL (ref 0.40–1.50)
GFR: 94 mL/min (ref >60.00)
GLUCOSE: 94 mg/dL (ref 70–99)
POTASSIUM: 4.4 meq/L (ref 3.5–5.1)
SODIUM: 138 meq/L (ref 135–145)

## 2015-05-28 LAB — TESTOSTERONE: TESTOSTERONE: 674.88 ng/dL (ref 300.00–890.00)

## 2015-05-28 LAB — TSH: TSH: 0.04 u[IU]/mL — ABNORMAL LOW (ref 0.35–4.50)

## 2015-05-28 LAB — T4, FREE: Free T4: 1.25 ng/dL (ref 0.60–1.60)

## 2015-05-31 ENCOUNTER — Ambulatory Visit (INDEPENDENT_AMBULATORY_CARE_PROVIDER_SITE_OTHER): Payer: 59 | Admitting: Endocrinology

## 2015-05-31 ENCOUNTER — Encounter: Payer: Self-pay | Admitting: Endocrinology

## 2015-05-31 VITALS — BP 126/60 | HR 93 | Temp 98.2°F | Resp 16 | Ht 67.0 in | Wt 200.0 lb

## 2015-05-31 DIAGNOSIS — E230 Hypopituitarism: Secondary | ICD-10-CM | POA: Diagnosis not present

## 2015-05-31 NOTE — Patient Instructions (Addendum)
Take 1 pill daily  Reduce Androgel to 2 pumps daily

## 2015-05-31 NOTE — Progress Notes (Signed)
Patient ID: Troy Collier, male   DOB: [birth year], 69 y.o.   MRN: [id number]   Chief complaint: Followup of hypopituitarism  History of Present Illness:  He has had pituitary adenoma surgery in 09/1997.  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 His levels have been variable but is now requiring relatively low doses. He is  taking 0.3 mg daily although has been on as low dose as 0.2 mg previously.  Currently on Norditropin brand and his brand name is changed periodically by insurance based on their preference.  Is compliant with his injections daily.  He does not complaining of unusual fatigue now  Last IGF 1 level is upper normal from a different lab at 185, he was told to reduce the dose from 0.35 down to 0.3  2. Hypothyroidism: Has been on relatively stable doses of thyroid supplement since his surgery.  His free T4 on his last visit was lower at 0.78.  Since this was related to some weight loss and recent surgery his dose was not changed but he was told to take an extra half pill weekly which he has not done He does feel fairly good now and has recovered almost completely from his surgery Has been compliant with his medication Free T4 level is improved significantly  Lab Results  Component Value Date   FREET4 1.25 05/28/2015   FREET4 0.78 02/26/2015   FREET4 1.02 08/21/2014   TSH 0.04* 05/28/2015   TSH 0.10* 04/26/2013    3. Hypogonadism: He is currently taking Axiron, 2 pumps under 1 arm and 1 on the other.  His testosterone levels had been increasing and his dose was reduced by one pump on the last visit.  Overall energy level is fairly good.  He had gained some weight but this is improving. His testosterone level is still relatively high Not clear what his hemoglobin level has been  Lab Results  Component Value Date   TESTOSTERONE 674.88 05/28/2015     4.  Adrenal insufficiency: He has been on stable doses of hydrocortisone. He has been taking only 15 mg hydrocortisone a day with no symptoms of adrenal insufficiency.  He was told to add another 5 mg hydrocortisone on his last visit when he was having a little nausea and having malaise when he was in the rehabilitation after his surgery.  However he forgot to increase the dose and still taking 10 mg in the morning and 5 mg the evening.  No lightheadedness, weakness or nausea now  5. Diabetes insipidus: He continues to take 2 tablets 3 times a day, usually every 8 hours.    He tends to have increased urinary frequency and thirst if he takes less than 6 tablets a day or if he is late in taking his medications.   Serum sodium is stable  OTHER problems:  He has had impaired fasting glucose and IFG and this has been managed with diet and exercise alone. Last fasting blood sugar is normal A1c is excellent at 5.6  Has not been able to resume exercise as yet because of busy work schedule  Wt Readings from Last 3 Encounters:  05/31/15 200 lb (90.719 kg)  02/28/15 198 lb (89.812 kg)  01/10/15 183 lb (83.008 kg)    Lab Results  Component Value Date   HGBA1C 5.6 02/26/2015   HGBA1C 6.1 05/04/2014  HGBA1C 6.1 04/28/2013   Lab Results  Component Value Date   LDLCALC 45 05/28/2015   CREATININE 0.86 05/28/2015    Dyslipidemia/metabolic syndrome: He is on Lipitor, tends to have relatively low HDL  Lab Results  Component Value Date   CHOL 94 05/28/2015   HDL 32.40* 05/28/2015   LDLCALC 45 05/28/2015   TRIG 86.0 05/28/2015   CHOLHDL 3 05/28/2015     LABS:    Appointment on 05/28/2015  Component Date Value Ref Range Status  . Sodium 05/28/2015 138  135 - 145 mEq/L Final  . Potassium 05/28/2015 4.4  3.5 - 5.1 mEq/L Final  . Chloride 05/28/2015 101  96 - 112 mEq/L Final  . CO2 05/28/2015 30  19 - 32 mEq/L Final  . Glucose, Bld 05/28/2015 94  70 - 99 mg/dL Final  . BUN 16/06/9603  21  6 - 23 mg/dL Final  . Creatinine, Ser 05/28/2015 0.86  0.40 - 1.50 mg/dL Final  . Calcium 54/05/8118 9.4  8.4 - 10.5 mg/dL Final  . GFR 14/78/2956 94.00  >60.00 mL/min Final  . Cholesterol 05/28/2015 94  0 - 200 mg/dL Final   ATP III Classification       Desirable:  < 200 mg/dL               Borderline High:  200 - 239 mg/dL          High:  > = 213 mg/dL  . Triglycerides 05/28/2015 86.0  0.0 - 149.0 mg/dL Final   Normal:  <086 mg/dLBorderline High:  150 - 199 mg/dL  . HDL 05/28/2015 32.40* >39.00 mg/dL Final  . VLDL 57/84/6962 17.2  0.0 - 40.0 mg/dL Final  . LDL Cholesterol 05/28/2015 45  0 - 99 mg/dL Final  . Total CHOL/HDL Ratio 05/28/2015 3   Final                  Men          Women1/2 Average Risk     3.4          3.3Average Risk          5.0          4.42X Average Risk          9.6          7.13X Average Risk          15.0          11.0                      . NonHDL 05/28/2015 61.74   Final   NOTE:  Non-HDL goal should be 30 mg/dL higher than patient's LDL goal (i.e. LDL goal of < 70 mg/dL, would have non-HDL goal of < 100 mg/dL)  . Free T4 05/28/2015 1.25  0.60 - 1.60 ng/dL Final  . TSH 95/28/4132 0.04* 0.35 - 4.50 uIU/mL Final  . Testosterone 05/28/2015 674.88  300.00 - 890.00 ng/dL Final       Medication List       This list is accurate as of: 05/31/15  4:27 PM.  Always use your most recent med list.               acetaminophen-codeine 300-30 MG tablet  Commonly known as:  TYLENOL #3  Take 1 tablet by mouth every 4 (four) hours as needed for pain (can take twice a day).     atorvastatin 10 MG tablet  Commonly  known as:  LIPITOR  Take 10 mg by mouth daily.     calcium carbonate 600 MG Tabs tablet  Commonly known as:  OS-CAL  Take 600 mg by mouth 2 (two) times daily with a meal.     cyclobenzaprine 10 MG tablet  Commonly known as:  FLEXERIL  Take 10 mg by mouth 3 (three) times daily as needed for muscle spasms.     desmopressin 0.2 MG tablet  Commonly known  as:  DDAVP  TAKE 2 TABLETS (0.4MG ) 3   TIMES DAILY     DULoxetine 60 MG capsule  Commonly known as:  CYMBALTA  Take 60 mg by mouth daily.     gabapentin 100 MG capsule  Commonly known as:  NEURONTIN  TAKE ONE CAPSULE 3 TIMES A DAY     GLUCOSAMINE MSM COMPLEX PO  Take 1 tablet by mouth daily.     hydrocortisone 5 MG tablet  Commonly known as:  CORTEF  2 tabs in the AM and 1 tab at 5 pm     levothyroxine 150 MCG tablet  Commonly known as:  SYNTHROID, LEVOTHROID  Take 1 tablet (150 mcg total) by mouth daily before breakfast.     lidocaine 5 %  Commonly known as:  LIDODERM  Place 1 patch onto the skin daily. Remove & Discard patch within 12 hours or as directed by MD     losartan 100 MG tablet  Commonly known as:  COZAAR  Take 100 mg by mouth daily.     multivitamin with minerals Tabs tablet  Take 1 tablet by mouth daily.     NORDITROPIN FLEXPRO 5 MG/1.5ML Soln  Generic drug:  Somatropin  DIAL DOSE OF 0.35MG  ON PEN DEVICE AND INJECT SUBCUTANEOUSLY 7 DAYS A WEEK. STORE UNDER REFRIGERATION. EXPIRES 28 DAYS AFTER INITIAL USE.     oxyCODONE-acetaminophen 5-325 MG tablet  Commonly known as:  PERCOCET/ROXICET  Take 1-2 tablets by mouth every 6 (six) hours as needed for moderate pain or severe pain.     psyllium 28 % packet  Commonly known as:  METAMUCIL SMOOTH TEXTURE  Take 1 packet by mouth 2 (two) times daily.     tamsulosin 0.4 MG Caps capsule  Commonly known as:  FLOMAX  Take 0.4 mg by mouth daily after supper.     Testosterone 30 MG/ACT Soln  Commonly known as:  AXIRON  Place 30 mg onto the skin 4 (four) times daily.     traZODone 100 MG tablet  Commonly known as:  DESYREL  Take 100 mg by mouth at bedtime.     vitamin C 500 MG tablet  Commonly known as:  ASCORBIC ACID  Take 500 mg by mouth 2 (two) times daily.     Vitamin D 2000 UNITS tablet  Take 2,000 Units by mouth daily.        Allergies:  Allergies  Allergen Reactions  . Accupril [Quinapril Hcl]    . Niacin And Related     Past Medical History  Diagnosis Date  . Hyperlipidemia   . Hypertension   . Diabetes insipidus   . Anxiety   . Sleep apnea     mild no cpap recommended  . Arthritis   . Fibromyalgia   . History of kidney stones   . Frequency of urination   . TMJ (temporomandibular joint disorder)   . Diabetes mellitus without complication     borderline,no meds A1C 5.7 on 10-20-2014  . Complication of anesthesia  hypotension,tmj,DIFFICULT TILT HEAD, lacking pituitary gland, pt. concerned about temperature regulation     Past Surgical History  Procedure Laterality Date  . Spine surgery    . Rotator cuff repair    . Posterior cervical fusion/foraminotomy N/A 06/07/2013    Procedure: Exploration of posterior Cervical six-seven Fusion with Cervical seven thoracic-one posterior cervical decompression/fusion;  Surgeon: Maeola Harman, MD;  Location: [facility];  Service: Neurosurgery;  Laterality: N/A;  . Ulnar nerve transposition Right 06/07/2013    Procedure: ULNAR NERVE DECOMPRESSION/TRANSPOSITION;  Surgeon: Maeola Harman, MD;  Location: [facility];  Service: Neurosurgery;  Laterality: Right;  . Brain surgery      2000   [facility], removed pituitary gland  . Posterior lumbar fusion 4 level N/A 10/26/2014    Procedure: Lumbar two-three, Lumba three-four Posterior Lateral Arthrodesis, Lumbar four-five Thoracolumbar Interbody Fusion, Lumbar five-Sacral one Posterior lumbar interbody fusion;  Surgeon: Maeola Harman, MD;  Location: [facility];  Service: Neurosurgery;  Laterality: N/A;  L2-3 L3-4 L4-5 L5-S1 Posterior lumbar interbody fusion  . Lumbar wound debridement N/A 11/28/2014    Procedure: LUMBAR WOUND DEBRIDEMENT;  Surgeon: Aliene Beams, MD;  Location: [facility];  Service: Neurosurgery;  Laterality: N/A;    Family History  Problem Relation Age of Onset  . Hypertension Other   . Obesity Other   . Heart attack Other     Social History:  reports that he has never  smoked. He has never used smokeless tobacco. He reports that he drinks alcohol. He reports that he does not use illicit drugs.  Review of Systems -   HYPERTENSION: He has been on treatment for several years, BP at home  checked periodically.  Followed by PCP   History of depression treated with Cymbalta   EXAM:  BP 126/60 mmHg  Pulse 93  Temp(Src) 98.2 F (36.8 C)  Resp 16  Ht  (1.702 m)  Wt 200 lb (90.719 kg)  BMI 31.32 kg/m2  SpO2 98%  Repeat blood pressure standing 130/70  No pallor Biceps reflexes appear normal Hands do not feel unusually cool No peripheral edema   Assessment/Plan:   PANHYPOPITUITARISM:  1. Secondary hypothyroidism: He had a lower free T4 with 150 g after his back surgery but it is much better now without change in dosage her subjectively he is doing well and will continue the same dose  2. Cortisol deficiency: He  has been doing well with only small doses of supplement.  Sodium is normal.  Even though he was supposed to take 20 mg a day is still taking only 15 and does not  have any signs or symptoms of adrenal insufficiency.  Has been aware of using stress doses for illness. 3. Growth hormone deficiency, long-term and requiring low-dose supplementation with good results  with improved energy level, still requiring relatively low dose of 0.3 mg and IGF-1 level needs to be checked on the next visit.  He will let us know if he needs a different brand when he changes his insurance in about a month 4. Hypogonadism: Subjectively has been doing well with supplementation and his levels have been progressively improved with using the correct technique of applying the Axiron.  However since a level is high normal will reduce his dosage to 2 pumps a day instead of 3 Need to check his level again in 3 months  Diabetes insipidus, long-standing he is quite compliant with his s DDAVP 3 times a day and sodium is normal  He will continue with 2 tablets every 8  hours indefinitely  Impaired fasting glucose: This is under control  Hypertension: Followed by PCP  Patient Instructions  Take 1 pill daily  Reduce Androgel to 2 pumps daily       KUMAR,AJAY 05/31/2015, 4:27 PM

## 2015-09-27 ENCOUNTER — Other Ambulatory Visit: Payer: 59

## 2015-09-28 ENCOUNTER — Other Ambulatory Visit (INDEPENDENT_AMBULATORY_CARE_PROVIDER_SITE_OTHER): Payer: 59

## 2015-09-28 DIAGNOSIS — E230 Hypopituitarism: Secondary | ICD-10-CM

## 2015-09-28 LAB — URINALYSIS, ROUTINE W REFLEX MICROSCOPIC
Bilirubin Urine: NEGATIVE
HGB URINE DIPSTICK: NEGATIVE
KETONES UR: NEGATIVE
LEUKOCYTES UA: NEGATIVE
NITRITE: NEGATIVE
SPECIFIC GRAVITY, URINE: 1.02 (ref 1.000–1.030)
Total Protein, Urine: NEGATIVE
URINE GLUCOSE: NEGATIVE
UROBILINOGEN UA: 0.2 (ref 0.0–1.0)
pH: 6 (ref 5.0–8.0)

## 2015-09-28 LAB — CBC
HCT: 41.6 % (ref 39.0–52.0)
Hemoglobin: 13.8 g/dL (ref 13.0–17.0)
MCHC: 33 g/dL (ref 30.0–36.0)
MCV: 91.5 fl (ref 78.0–100.0)
PLATELETS: 245 10*3/uL (ref 150.0–400.0)
RBC: 4.55 Mil/uL (ref 4.22–5.81)
RDW: 13.8 % (ref 11.5–15.5)
WBC: 5.7 10*3/uL (ref 4.0–10.5)

## 2015-09-28 LAB — BASIC METABOLIC PANEL
BUN: 15 mg/dL (ref 6–23)
CO2: 32 mEq/L (ref 19–32)
CREATININE: 0.87 mg/dL (ref 0.40–1.50)
Calcium: 9 mg/dL (ref 8.4–10.5)
Chloride: 104 mEq/L (ref 96–112)
GFR: 92.67 mL/min (ref >60.00)
Glucose, Bld: 98 mg/dL (ref 70–99)
Potassium: 4.4 mEq/L (ref 3.5–5.1)
Sodium: 141 mEq/L (ref 135–145)

## 2015-09-28 LAB — T4, FREE: FREE T4: 0.97 ng/dL (ref 0.60–1.60)

## 2015-09-28 LAB — TESTOSTERONE: Testosterone: 319.3 ng/dL (ref 300.00–890.00)

## 2015-09-30 LAB — INSULIN-LIKE GROWTH FACTOR: INSULIN LIKE GF 1: 124 ng/mL (ref 47–192)

## 2015-10-01 ENCOUNTER — Encounter: Payer: Self-pay | Admitting: Endocrinology

## 2015-10-01 ENCOUNTER — Other Ambulatory Visit: Payer: Self-pay | Admitting: *Deleted

## 2015-10-01 ENCOUNTER — Ambulatory Visit (INDEPENDENT_AMBULATORY_CARE_PROVIDER_SITE_OTHER): Payer: 59 | Admitting: Endocrinology

## 2015-10-01 VITALS — BP 138/68 | HR 81 | Temp 98.1°F | Resp 16 | Ht 67.0 in | Wt 202.8 lb

## 2015-10-01 DIAGNOSIS — E230 Hypopituitarism: Secondary | ICD-10-CM | POA: Diagnosis not present

## 2015-10-01 DIAGNOSIS — R7303 Prediabetes: Secondary | ICD-10-CM

## 2015-10-01 MED ORDER — LEVOTHYROXINE SODIUM 150 MCG PO TABS: 150.0000 ug | ORAL_TABLET | Freq: Every day | ORAL | Status: DC

## 2015-10-01 MED ORDER — HYDROCORTISONE 5 MG PO TABS: ORAL_TABLET | ORAL | Status: DC

## 2015-10-01 MED ORDER — DESMOPRESSIN ACETATE 0.2 MG PO TABS: ORAL_TABLET | ORAL | Status: DC

## 2015-10-01 NOTE — Progress Notes (Signed)
Patient ID: Troy Collier, male   DOB: [birth year], 70 y.o.   MRN: [id number]   Chief complaint: Followup of hypopituitarism  History of Present Illness:  He has had pituitary adenoma surgery in 09/1997.  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 His levels have been variable but is now requiring relatively low doses. He is  taking 0.3 mg daily although has been on as low dose as 0.2 mg previously.  Currently on Norditropin brand and his brand name is changed periodically by insurance based on their preference.  Is compliant with his injections daily.  He does not complaining of unusual fatigue recently  Last IGF 1 level is  normal  2. Hypothyroidism: Has been on relatively stable doses of thyroid supplement since his surgery.  He does feel fairly good now although he thinks some of his tiredness may be related to residual effects of his back surgery Has been compliant with his medication, now taking 150 g levothyroxine Free T4 level is quite normal    Lab Results  Component Value Date   FREET4 0.97 09/28/2015   FREET4 1.25 05/28/2015   FREET4 0.78 02/26/2015   TSH 0.04* 05/28/2015   TSH 0.10* 04/26/2013    3. Hypogonadism: He is currently taking Axiron, 2 pumps daily.   His testosterone levels had been increasing and his dose was reduced by one pump on the last visit.  Overall energy level is fairly good.   His testosterone level is low normal but he had missed a dose the day before because of traveling   Lab Results  Component Value Date   TESTOSTERONE 319.30 09/28/2015    4.  Adrenal insufficiency: He has been on stable doses of hydrocortisone. He has been taking only 15 mg hydrocortisone a day with no symptoms of adrenal insufficiency. No lightheadedness, weakness or nausea.  However occasionally he may have a little nausea and he will take an extra  hydrocortisone tablet with relief.  He understands the need to increase the dose for stress.  No symptoms of lightheadedness 5. Diabetes insipidus: He continues to take 2 tablets 3 times a day, usually every 8 hours.    He tends to have increased urinary frequency and thirst if he takes less than 6 tablets a day  Serum sodium is stable  OTHER problems:  He has had impaired fasting glucose and IFG and this has been managed with diet and exercise alone. Last fasting blood sugar is normal A1c is excellent at 5.6  Trying to be more active now since he is partly retired  [employer] from Last 3 Encounters:  10/01/15 202 lb 12.8 oz (91.989 kg)  05/31/15 200 lb (90.719 kg)  02/28/15 198 lb (89.812 kg)    Lab Results  Component Value Date   HGBA1C 5.6 02/26/2015   HGBA1C 6.1 05/04/2014   HGBA1C 6.1 04/28/2013   Lab Results  Component Value Date   LDLCALC 45 05/28/2015   CREATININE 0.87 09/28/2015    Dyslipidemia/metabolic syndrome: He is on Lipitor, tends to have relatively low HDL  Lab Results  Component Value Date   CHOL 94 05/28/2015   HDL 32.40* 05/28/2015   LDLCALC 45 05/28/2015   TRIG 86.0 05/28/2015   CHOLHDL 3 05/28/2015     LABS:    Lab on 09/28/2015  Component Date Value Ref Range Status  .  Sodium 09/28/2015 141  135 - 145 mEq/L Final  . Potassium 09/28/2015 4.4  3.5 - 5.1 mEq/L Final  . Chloride 09/28/2015 104  96 - 112 mEq/L Final  . CO2 09/28/2015 32  19 - 32 mEq/L Final  . Glucose, Bld 09/28/2015 98  70 - 99 mg/dL Final  . BUN 40/98/1191 15  6 - 23 mg/dL Final  . Creatinine, Ser 09/28/2015 0.87  0.40 - 1.50 mg/dL Final  . Calcium 47/82/9562 9.0  8.4 - 10.5 mg/dL Final  . GFR 13/04/6577 92.67  >60.00 mL/min Final  . Color, Urine 09/28/2015 YELLOW  Yellow;Lt. Yellow Final  . APPearance 09/28/2015 CLEAR  Clear Final  . Specific Gravity, Urine 09/28/2015 1.020  1.000-1.030 Final  . pH 09/28/2015 6.0  5.0 - 8.0 Final  . Total Protein, Urine 09/28/2015  NEGATIVE  Negative Final  . Urine Glucose 09/28/2015 NEGATIVE  Negative Final  . Ketones, ur 09/28/2015 NEGATIVE  Negative Final  . Bilirubin Urine 09/28/2015 NEGATIVE  Negative Final  . Hgb urine dipstick 09/28/2015 NEGATIVE  Negative Final  . Urobilinogen, UA 09/28/2015 0.2  0.0 - 1.0 Final  . Leukocytes, UA 09/28/2015 NEGATIVE  Negative Final  . Nitrite 09/28/2015 NEGATIVE  Negative Final  . WBC, UA 09/28/2015 0-2/hpf  0-2/hpf Final  . RBC / HPF 09/28/2015 0-2/hpf  0-2/hpf Final  . Ca Oxalate Crys, UA 09/28/2015 Presence of* None Final  . Free T4 09/28/2015 0.97  0.60 - 1.60 ng/dL Final  . Testosterone 46/96/2952 319.30  300.00 - 890.00 ng/dL Final  . WBC 84/13/2440 5.7  4.0 - 10.5 K/uL Final  . RBC 09/28/2015 4.55  4.22 - 5.81 Mil/uL Final  . Platelets 09/28/2015 245.0  150.0 - 400.0 K/uL Final  . Hemoglobin 09/28/2015 13.8  13.0 - 17.0 g/dL Final  . HCT 06/28/2535 41.6  39.0 - 52.0 % Final  . MCV 09/28/2015 91.5  78.0 - 100.0 fl Final  . MCHC 09/28/2015 33.0  30.0 - 36.0 g/dL Final  . RDW 64/40/3474 13.8  11.5 - 15.5 % Final  . Insulin-Like GF-1 09/28/2015 124  47 - 192 ng/mL Final       Medication List       This list is accurate as of: 10/01/15  1:05 PM.  Always use your most recent med list.               acetaminophen-codeine 300-30 MG tablet  Commonly known as:  TYLENOL #3  Take 1 tablet by mouth every 4 (four) hours as needed for pain (can take twice a day).     atorvastatin 10 MG tablet  Commonly known as:  LIPITOR  Take 10 mg by mouth daily.     calcium carbonate 600 MG Tabs tablet  Commonly known as:  OS-CAL  Take 600 mg by mouth 2 (two) times daily with a meal.     cyclobenzaprine 10 MG tablet  Commonly known as:  FLEXERIL  Take 10 mg by mouth 3 (three) times daily as needed for muscle spasms.     desmopressin 0.2 MG tablet  Commonly known as:  DDAVP  TAKE 2 TABLETS (0.4MG ) 3   TIMES DAILY     DULoxetine 60 MG capsule  Commonly known as:  CYMBALTA   Take 60 mg by mouth daily.     gabapentin 100 MG capsule  Commonly known as:  NEURONTIN  TAKE ONE CAPSULE 3 TIMES A DAY     GLUCOSAMINE MSM COMPLEX PO  Take 1 tablet by  mouth daily.     hydrocortisone 5 MG tablet  Commonly known as:  CORTEF  2 tabs in the AM and 1 tab at 5 pm     levothyroxine 150 MCG tablet  Commonly known as:  SYNTHROID, LEVOTHROID  Take 1 tablet (150 mcg total) by mouth daily before breakfast.     lidocaine 5 %  Commonly known as:  LIDODERM  Place 1 patch onto the skin daily. Remove & Discard patch within 12 hours or as directed by MD     losartan 100 MG tablet  Commonly known as:  COZAAR  Take 100 mg by mouth daily.     multivitamin with minerals Tabs tablet  Take 1 tablet by mouth daily.     NORDITROPIN FLEXPRO 5 MG/1.5ML Soln  Generic drug:  Somatropin  DIAL DOSE OF 0.35MG  ON PEN DEVICE AND INJECT SUBCUTANEOUSLY 7 DAYS A WEEK. STORE UNDER REFRIGERATION. EXPIRES 28 DAYS AFTER INITIAL USE.     oxyCODONE-acetaminophen 5-325 MG tablet  Commonly known as:  PERCOCET/ROXICET  Take 1-2 tablets by mouth every 6 (six) hours as needed for moderate pain or severe pain.     psyllium 28 % packet  Commonly known as:  METAMUCIL SMOOTH TEXTURE  Take 1 packet by mouth 2 (two) times daily.     sildenafil 20 MG tablet  Commonly known as:  REVATIO  TAKE 5 TABLETS BY MOUTH 30 MINUTES PRIOR TO ACTIVITY     tamsulosin 0.4 MG Caps capsule  Commonly known as:  FLOMAX  Take 0.4 mg by mouth daily after supper.     Testosterone 30 MG/ACT Soln  Commonly known as:  AXIRON  Place 30 mg onto the skin 4 (four) times daily.     traZODone 100 MG tablet  Commonly known as:  DESYREL  Take 100 mg by mouth at bedtime.     vitamin C 500 MG tablet  Commonly known as:  ASCORBIC ACID  Take 500 mg by mouth 2 (two) times daily.     Vitamin D 2000 units tablet  Take 2,000 Units by mouth daily.        Allergies:  Allergies  Allergen Reactions  . Accupril [Quinapril Hcl]    . Niacin And Related     Past Medical History  Diagnosis Date  . Hyperlipidemia   . Hypertension   . Diabetes insipidus (HCC)   . Anxiety   . Sleep apnea     mild no cpap recommended  . Arthritis   . Fibromyalgia   . History of kidney stones   . Frequency of urination   . TMJ (temporomandibular joint disorder)   . Diabetes mellitus without complication (HCC)     borderline,no meds A1C 5.7 on 10-20-2014  . Complication of anesthesia     hypotension,tmj,DIFFICULT TILT HEAD, lacking pituitary gland, pt. concerned about temperature regulation     Past Surgical History  Procedure Laterality Date  . Spine surgery    . Rotator cuff repair    . Posterior cervical fusion/foraminotomy N/A 06/07/2013    Procedure: Exploration of posterior Cervical six-seven Fusion with Cervical seven thoracic-one posterior cervical decompression/fusion;  Surgeon: Maeola Harman, MD;  Location: [facility];  Service: Neurosurgery;  Laterality: N/A;  . Ulnar nerve transposition Right 06/07/2013    Procedure: ULNAR NERVE DECOMPRESSION/TRANSPOSITION;  Surgeon: Maeola Harman, MD;  Location: [facility];  Service: Neurosurgery;  Laterality: Right;  . Brain surgery      2000   mc, removed pituitary gland  .  Posterior lumbar fusion 4 level N/A 10/26/2014    Procedure: Lumbar two-three, Lumba three-four Posterior Lateral Arthrodesis, Lumbar four-five Thoracolumbar Interbody Fusion, Lumbar five-Sacral one Posterior lumbar interbody fusion;  Surgeon: Maeola Harman, MD;  Location: [facility];  Service: Neurosurgery;  Laterality: N/A;  L2-3 L3-4 L4-5 L5-S1 Posterior lumbar interbody fusion  . Lumbar wound debridement N/A 11/28/2014    Procedure: LUMBAR WOUND DEBRIDEMENT;  Surgeon: Aliene Beams, MD;  Location: [facility];  Service: Neurosurgery;  Laterality: N/A;    Family History  Problem Relation Age of Onset  . Hypertension Other   . Obesity Other   . Heart attack Other     Social History:  reports that he has  never smoked. He has never used smokeless tobacco. He reports that he drinks alcohol. He reports that he does not use illicit drugs.  Review of Systems -   HYPERTENSION: He has been on treatment for several years, BP at home  checked periodically.  Followed by PCP   History of depression treated with Cymbalta   EXAM:  BP 138/68 mmHg  Pulse 81  Temp(Src) 98.1 F (36.7 C)  Resp 16  Ht  (1.702 m)  Wt 202 lb 12.8 oz (91.989 kg)  BMI 31.76 kg/m2  SpO2 95%  Repeat blood pressure standing 138/72  No pallor Biceps reflexes appear normal Hands do not feel unusually cool No peripheral edema   Assessment/Plan:   PANHYPOPITUITARISM:  1. Secondary hypothyroidism: He now has a normal free T4 with 150 g levothyroxine.  No change in dosage needed as he is subjectively doing well  2. Cortisol deficiency: He  has been doing well with only small doses of supplement.  Sodium is normal.  Even though he  is still taking only 15 he does not  have any signs or symptoms of adrenal insufficiency.  Has been aware of using stress doses for illness.  He will take an extra tablet if he has nausea which appears to be improved with extra dosage 3. Growth hormone deficiency, long-term and requiring low-dose supplementation with good results  with improved energy level, still requiring relatively low dose of 0.3 mg.  Recently has normal IGF-1 level  4. Hypogonadism: Subjectively has been doing well with supplementation and his levels have been previously relatively high Now his level is in the normal range and he can continue to pumps a day.  He did have a missed dose the day before he went to the lab Need to check his level again in 6 months  Diabetes insipidus, long-standing and well-controlled with his s DDAVP 3 times a day; urine specific gravity and sodium is normal   Impaired fasting glucose: This is under control and followed by PCP  HYPERTENSION: Controlled, Followed by PCP  There are no  Patient Instructions on file for this visit.    Willman Cuny 10/01/2015, 1:05 PM

## 2016-03-25 ENCOUNTER — Other Ambulatory Visit (INDEPENDENT_AMBULATORY_CARE_PROVIDER_SITE_OTHER): Payer: Medicare Other

## 2016-03-25 DIAGNOSIS — E230 Hypopituitarism: Secondary | ICD-10-CM

## 2016-03-25 DIAGNOSIS — R7303 Prediabetes: Secondary | ICD-10-CM

## 2016-03-25 LAB — CBC
HCT: 44.9 % (ref 39.0–52.0)
HEMOGLOBIN: 14.9 g/dL (ref 13.0–17.0)
MCHC: 33.3 g/dL (ref 30.0–36.0)
MCV: 90.9 fl (ref 78.0–100.0)
Platelets: 218 10*3/uL (ref 150.0–400.0)
RBC: 4.94 Mil/uL (ref 4.22–5.81)
RDW: 13.8 % (ref 11.5–15.5)
WBC: 5.8 10*3/uL (ref 4.0–10.5)

## 2016-03-25 LAB — COMPREHENSIVE METABOLIC PANEL
ALK PHOS: 72 U/L (ref 39–117)
ALT: 25 U/L (ref 0–53)
AST: 18 U/L (ref 0–37)
Albumin: 4.5 g/dL (ref 3.5–5.2)
BILIRUBIN TOTAL: 1 mg/dL (ref 0.2–1.2)
BUN: 13 mg/dL (ref 6–23)
CALCIUM: 9.5 mg/dL (ref 8.4–10.5)
CO2: 32 meq/L (ref 19–32)
Chloride: 102 mEq/L (ref 96–112)
Creatinine, Ser: 0.98 mg/dL (ref 0.40–1.50)
GFR: 80.65 mL/min (ref >60.00)
Glucose, Bld: 104 mg/dL — ABNORMAL HIGH (ref 70–99)
Potassium: 4.5 mEq/L (ref 3.5–5.1)
Sodium: 140 mEq/L (ref 135–145)
Total Protein: 7.2 g/dL (ref 6.0–8.3)

## 2016-03-25 LAB — POCT URINALYSIS DIPSTICK
Blood, UA: NEGATIVE
Glucose, UA: NEGATIVE
Ketones, UA: NEGATIVE
LEUKOCYTES UA: NEGATIVE
NITRITE UA: NEGATIVE
PH UA: 6
PROTEIN UA: NEGATIVE
Spec Grav, UA: 1.015
UROBILINOGEN UA: 0.2

## 2016-03-25 LAB — TESTOSTERONE: TESTOSTERONE: 676.79 ng/dL (ref 300.00–890.00)

## 2016-03-25 LAB — HEMOGLOBIN A1C: Hgb A1c MFr Bld: 5.8 % (ref 4.6–6.5)

## 2016-03-25 LAB — T4, FREE: FREE T4: 1.11 ng/dL (ref 0.60–1.60)

## 2016-03-28 ENCOUNTER — Ambulatory Visit (INDEPENDENT_AMBULATORY_CARE_PROVIDER_SITE_OTHER): Payer: 59 | Admitting: Endocrinology

## 2016-03-28 ENCOUNTER — Other Ambulatory Visit: Payer: Self-pay

## 2016-03-28 ENCOUNTER — Encounter: Payer: Self-pay | Admitting: Endocrinology

## 2016-03-28 VITALS — BP 142/72 | HR 90 | Wt 202.0 lb

## 2016-03-28 DIAGNOSIS — I10 Essential (primary) hypertension: Secondary | ICD-10-CM | POA: Diagnosis not present

## 2016-03-28 DIAGNOSIS — E230 Hypopituitarism: Secondary | ICD-10-CM | POA: Diagnosis not present

## 2016-03-28 DIAGNOSIS — R7303 Prediabetes: Secondary | ICD-10-CM

## 2016-03-28 MED ORDER — LEVOTHYROXINE SODIUM 150 MCG PO TABS: 150.0000 ug | ORAL_TABLET | Freq: Every day | ORAL | 1 refills | Status: AC

## 2016-03-28 MED ORDER — TESTOSTERONE 30 MG/ACT TD SOLN: TRANSDERMAL | 1 refills | Status: DC

## 2016-03-28 NOTE — Progress Notes (Signed)
Patient ID: Troy Collier, male   DOB: 04/25/1946, 70 y.o.   MRN: [id number]   Chief complaint: Followup of hypopituitarism  History of Present Illness:  He has had pituitary adenoma surgery in 09/1997.  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 His levels have been variable but is  requiring relatively low doses. He was taking 0.3 mg daily although has been on as low dose as 0.2 mg previously.  He was on Norditropin brand and his brand name is changed periodically by insurance based on their preference. Because of higher cost of the medication out of pocket he stopped taking it 2 months ago and did not notify us .  He does not complaining of unusual fatigue recently  Last IGF 1 level is  normal on treatment but not tested recently  2. Hypothyroidism: Has been on relatively stable doses of thyroid supplement since his surgery.  He does feel fairly good now with his energy level and no cold intolerance Has been compliant with his medication, now taking 150 g levothyroxine Free T4 level is quite normal    Lab Results  Component Value Date   FREET4 1.11 03/25/2016   FREET4 0.97 09/28/2015   FREET4 1.25 05/28/2015   TSH 0.04 (L) 05/28/2015   TSH 0.10 (L) 04/26/2013    3. Hypogonadism: He is currently taking Axiron, 3 pumps daily.  He had been advised to reduce the dose to 2 pumps previously but apparently has not done so  .  Overall energy level is fairly good.   His testosterone level On the last visit was low normal probably from missing a dose before his lab However now his level is higher at 677   Lab Results  Component Value Date   TESTOSTERONE 676.79 03/25/2016   TESTOSTERONE 319.30 09/28/2015   TESTOSTERONE 674.88 05/28/2015     4.  Adrenal insufficiency: He has been on stable doses of hydrocortisone. He has been taking only 15 mg hydrocortisone  a day with no symptoms of adrenal insufficiency. No lightheadedness, weakness or nausea.  He says that he is missing her dose he will start feeling a little nausea. Otherwise feels fairly good and no lightheadedness, decreased appetite or weight loss  5. Diabetes insipidus: He continues to take 2 tablets 3 times a day, usually every 8 hours.    He tends to have increased urinary frequency and thirst if he takes less than 6 tablets a day  Serum sodium is stable, urine specific gravity 1015  OTHER problems:  He has had impaired fasting glucose and IFG and this has been managed with diet and exercise alone. A1c is excellent at 5.8 Lab glucose was 104 but he had juice before this   Trying to be more active and weight is stable  Wt Readings from Last 3 Encounters:  03/28/16 202 lb (91.6 kg)  10/01/15 202 lb 12.8 oz (92 kg)  05/31/15 200 lb (90.7 kg)    Lab Results  Component Value Date   HGBA1C 5.8 03/25/2016   HGBA1C 5.6 02/26/2015   HGBA1C 6.1 05/04/2014   Lab Results  Component Value Date   LDLCALC 45 05/28/2015   CREATININE 0.98 03/25/2016    Dyslipidemia/metabolic syndrome: He is on Lipitor, tends to have relatively low HDL  Lab Results  Component Value Date   CHOL 94 05/28/2015   HDL  32.40 (L) 05/28/2015   LDLCALC 45 05/28/2015   TRIG 86.0 05/28/2015   CHOLHDL 3 05/28/2015     LABS:    Lab on 03/25/2016  Component Date Value Ref Range Status  . Hgb A1c MFr Bld 03/25/2016 5.8  4.6 - 6.5 % Final  . Sodium 03/25/2016 140  135 - 145 mEq/L Final  . Potassium 03/25/2016 4.5  3.5 - 5.1 mEq/L Final  . Chloride 03/25/2016 102  96 - 112 mEq/L Final  . CO2 03/25/2016 32  19 - 32 mEq/L Final  . Glucose, Bld 03/25/2016 104* 70 - 99 mg/dL Final  . BUN 53/66/4403 13  6 - 23 mg/dL Final  . Creatinine, Ser 03/25/2016 0.98  0.40 - 1.50 mg/dL Final  . Total Bilirubin 03/25/2016 1.0  0.2 - 1.2 mg/dL Final  . Alkaline Phosphatase 03/25/2016 72  39 - 117 U/L Final  . AST  03/25/2016 18  0 - 37 U/L Final  . ALT 03/25/2016 25  0 - 53 U/L Final  . Total Protein 03/25/2016 7.2  6.0 - 8.3 g/dL Final  . Albumin 47/42/5956 4.5  3.5 - 5.2 g/dL Final  . Calcium 38/75/6433 9.5  8.4 - 10.5 mg/dL Final  . GFR 29/51/8841 80.65  >60.00 mL/min Final  . Free T4 03/25/2016 1.11  0.60 - 1.60 ng/dL Final  . Testosterone 66/01/3015 676.79  300.00 - 890.00 ng/dL Final  . WBC 09/09/3233 5.8  4.0 - 10.5 K/uL Final  . RBC 03/25/2016 4.94  4.22 - 5.81 Mil/uL Final  . Platelets 03/25/2016 218.0  150.0 - 400.0 K/uL Final  . Hemoglobin 03/25/2016 14.9  13.0 - 17.0 g/dL Final  . HCT 57/32/2025 44.9  39.0 - 52.0 % Final  . MCV 03/25/2016 90.9  78.0 - 100.0 fl Final  . MCHC 03/25/2016 33.3  30.0 - 36.0 g/dL Final  . RDW 42/70/6237 13.8  11.5 - 15.5 % Final  . Color, UA 03/25/2016 yellow   Final  . Clarity, UA 03/25/2016 clear   Final  . Glucose, UA 03/25/2016 negative   Final  . Bilirubin, UA 03/25/2016 large   Final  . Ketones, UA 03/25/2016 negative   Final  . Spec Grav, UA 03/25/2016 1.015   Final  . Blood, UA 03/25/2016 negative   Final  . pH, UA 03/25/2016 6.0   Final  . Protein, UA 03/25/2016 negative   Final  . Urobilinogen, UA 03/25/2016 0.2   Final  . Nitrite, UA 03/25/2016 negative   Final  . Leukocytes, UA 03/25/2016 Negative  Negative Final       Medication List       Accurate as of 03/28/16  5:10 PM. Always use your most recent med list.          acetaminophen-codeine 300-30 MG tablet Commonly known as:  TYLENOL #3 Take 1 tablet by mouth every 4 (four) hours as needed for pain (can take twice a day).   atorvastatin 10 MG tablet Commonly known as:  LIPITOR Take 10 mg by mouth daily.   calcium carbonate 600 MG Tabs tablet Commonly known as:  OS-CAL Take 600 mg by mouth 2 (two) times daily with a meal.   cyclobenzaprine 10 MG tablet Commonly known as:  FLEXERIL Take 10 mg by mouth 3 (three) times daily as needed for muscle spasms.   desmopressin 0.2  MG tablet Commonly known as:  DDAVP TAKE 2 TABLETS (0.4MG ) 3   TIMES DAILY   DULoxetine 60 MG capsule Commonly known as:  CYMBALTA Take 60 mg by mouth daily.   gabapentin 100 MG capsule Commonly known as:  NEURONTIN TAKE ONE CAPSULE 3 TIMES A DAY   GLUCOSAMINE MSM COMPLEX PO Take 1 tablet by mouth daily.   hydrocortisone 5 MG tablet Commonly known as:  CORTEF 2 tabs in the AM and 1 tab at 5 pm   levothyroxine 150 MCG tablet Commonly known as:  SYNTHROID, LEVOTHROID Take 1 tablet (150 mcg total) by mouth daily before breakfast.   lidocaine 5 % Commonly known as:  LIDODERM Place 1 patch onto the skin daily. Remove & Discard patch within 12 hours or as directed by MD   losartan 100 MG tablet Commonly known as:  COZAAR Take 100 mg by mouth daily.   multivitamin with minerals Tabs tablet Take 1 tablet by mouth daily.   NORDITROPIN FLEXPRO 5 MG/1.5ML Soln Generic drug:  Somatropin DIAL DOSE OF 0.35MG  ON PEN DEVICE AND INJECT SUBCUTANEOUSLY 7 DAYS A WEEK. STORE UNDER REFRIGERATION. EXPIRES 28 DAYS AFTER INITIAL USE.   oxyCODONE-acetaminophen 5-325 MG tablet Commonly known as:  PERCOCET/ROXICET Take 1-2 tablets by mouth every 6 (six) hours as needed for moderate pain or severe pain.   psyllium 28 % packet Commonly known as:  METAMUCIL SMOOTH TEXTURE Take 1 packet by mouth 2 (two) times daily.   sildenafil 20 MG tablet Commonly known as:  REVATIO TAKE 5 TABLETS BY MOUTH 30 MINUTES PRIOR TO ACTIVITY   tamsulosin 0.4 MG Caps capsule Commonly known as:  FLOMAX Take 0.4 mg by mouth daily after supper.   Testosterone 30 MG/ACT Soln Commonly known as:  AXIRON Use 2 pumps daily.   traZODone 100 MG tablet Commonly known as:  DESYREL Take 100 mg by mouth at bedtime.   vitamin C 500 MG tablet Commonly known as:  ASCORBIC ACID Take 500 mg by mouth 2 (two) times daily.   Vitamin D 2000 units tablet Take 2,000 Units by mouth daily.       Allergies:  Allergies    Allergen Reactions  . Accupril [Quinapril Hcl]   . Niacin And Related     Past Medical History:  Diagnosis Date  . Anxiety   . Arthritis   . Complication of anesthesia    hypotension,tmj,DIFFICULT TILT HEAD, lacking pituitary gland, pt. concerned about temperature regulation   . Diabetes insipidus (HCC)   . Diabetes mellitus without complication (HCC)    borderline,no meds A1C 5.7 on 10-20-2014  . Fibromyalgia   . Frequency of urination   . History of kidney stones   . Hyperlipidemia   . Hypertension   . Sleep apnea    mild no cpap recommended  . TMJ (temporomandibular joint disorder)     Past Surgical History:  Procedure Laterality Date  . BRAIN SURGERY     2000   [facility], removed pituitary gland  . LUMBAR WOUND DEBRIDEMENT N/A 11/28/2014   Procedure: LUMBAR WOUND DEBRIDEMENT;  Surgeon: Aliene Beams, MD;  Location: [facility];  Service: Neurosurgery;  Laterality: N/A;  . POSTERIOR CERVICAL FUSION/FORAMINOTOMY N/A 06/07/2013   Procedure: Exploration of posterior Cervical six-seven Fusion with Cervical seven thoracic-one posterior cervical decompression/fusion;  Surgeon: Maeola Harman, MD;  Location: [facility];  Service: Neurosurgery;  Laterality: N/A;  . POSTERIOR LUMBAR FUSION 4 LEVEL N/A 10/26/2014   Procedure: Lumbar two-three, Lumba three-four Posterior Lateral Arthrodesis, Lumbar four-five Thoracolumbar Interbody Fusion, Lumbar five-Sacral one Posterior lumbar interbody fusion;  Surgeon: Maeola Harman, MD;  Location: [facility];  Service: Neurosurgery;  Laterality: N/A;  L2-3 L3-4 L4-5 L5-S1 Posterior lumbar interbody fusion  . ROTATOR CUFF REPAIR    . SPINE SURGERY    . ULNAR NERVE TRANSPOSITION Right 06/07/2013   Procedure: ULNAR NERVE DECOMPRESSION/TRANSPOSITION;  Surgeon: Maeola Harman, MD;  Location: [facility];  Service: Neurosurgery;  Laterality: Right;    Family History  Problem Relation Age of Onset  . Hypertension Other   . Obesity Other   . Heart attack  Other     Social History:  reports that he has never smoked. He has never used smokeless tobacco. He reports that he drinks alcohol. He reports that he does not use drugs.  Review of Systems -   HYPERTENSION: He has been on treatment for several years, BP at home  checked periodically.  Followed by PCP   History of depression treated with Cymbalta   EXAM:  BP (!) 142/72 (BP Location: Left Arm, Patient Position: Sitting)   Pulse 90   Wt 202 lb (91.6 kg)   SpO2 97%   BMI 31.64 kg/m      Assessment/Plan:   PANHYPOPITUITARISM:  1. Secondary hypothyroidism: He  has a normal free T4 with 150 g levothyroxine.  No change in dosage needed as he is subjectively doing well  2. Cortisol deficiency: He  has been doing well with only small doses of supplement.   Even though he  is still taking only 15 he does not  have any signs or symptoms of adrenal insufficiency.  Has been aware of using stress doses for illness.  He will continue the same dose and be consistently compliant with this 3. Growth hormone deficiency, long-term and previously requiring low-dose supplementation with good results Although he has stopped the treatment on his own because of the high cost out of pocket he does not think he feels any more fatigue.  Explained the role of growth hormone in hypopituitarism. Will need to check his growth hormone level again today to decide whether he needs to renew her prescription or not  4. Hypogonadism: Subjectively has been doing well with supplementation and his level is again relatively high with using 3 pumps of Axiron He was supposed to reduce the dose to 2 pumps daily. Since his level is relatively higher he will reduce the dose now Need to check his level again in 6 months  Diabetes insipidus, long-standing and well-controlled with his DDAVP 3 times a day; urine specific gravity and sodium is normal   Impaired fasting glucose: A1c normal.  HYPERTENSION: Controlled,  Followed by PCP  Total visit time for management of multiple problems, Labs and counseling = 25 minutes  Patient Instructions  Axiron 1 pump under each arm     Shaylene Paganelli 03/28/2016, 5:10 PM   Note: This office note was prepared with Dragon voice recognition system technology. Any transcriptional errors that result from this process are unintentional.

## 2016-03-28 NOTE — Patient Instructions (Signed)
Axiron 1 pump under each arm

## 2016-03-30 LAB — INSULIN-LIKE GROWTH FACTOR: Insulin-Like GF-1: 110 ng/mL (ref 47–192)

## 2016-06-06 ENCOUNTER — Other Ambulatory Visit: Payer: Self-pay | Admitting: *Deleted

## 2016-06-06 MED ORDER — TESTOSTERONE 20.25 MG/1.25GM (1.62%) TD GEL: TRANSDERMAL | 5 refills | Status: DC

## 2016-06-23 DIAGNOSIS — G4700 Insomnia, unspecified: Secondary | ICD-10-CM | POA: Insufficient documentation

## 2016-06-23 DIAGNOSIS — M19042 Primary osteoarthritis, left hand: Secondary | ICD-10-CM

## 2016-06-23 DIAGNOSIS — M19041 Primary osteoarthritis, right hand: Secondary | ICD-10-CM | POA: Insufficient documentation

## 2016-06-23 DIAGNOSIS — M47812 Spondylosis without myelopathy or radiculopathy, cervical region: Secondary | ICD-10-CM | POA: Insufficient documentation

## 2016-06-23 DIAGNOSIS — M797 Fibromyalgia: Secondary | ICD-10-CM | POA: Insufficient documentation

## 2016-06-23 DIAGNOSIS — M47816 Spondylosis without myelopathy or radiculopathy, lumbar region: Secondary | ICD-10-CM | POA: Insufficient documentation

## 2016-06-23 DIAGNOSIS — R5383 Other fatigue: Secondary | ICD-10-CM | POA: Insufficient documentation

## 2016-06-24 ENCOUNTER — Encounter: Payer: Self-pay | Admitting: Rheumatology

## 2016-06-24 ENCOUNTER — Ambulatory Visit (INDEPENDENT_AMBULATORY_CARE_PROVIDER_SITE_OTHER): Payer: Medicare Other | Admitting: Rheumatology

## 2016-06-24 VITALS — BP 153/75 | HR 79 | Resp 13 | Ht 67.5 in | Wt 203.0 lb

## 2016-06-24 DIAGNOSIS — G4700 Insomnia, unspecified: Secondary | ICD-10-CM

## 2016-06-24 DIAGNOSIS — M5030 Other cervical disc degeneration, unspecified cervical region: Secondary | ICD-10-CM | POA: Diagnosis not present

## 2016-06-24 DIAGNOSIS — M797 Fibromyalgia: Secondary | ICD-10-CM | POA: Diagnosis not present

## 2016-06-24 DIAGNOSIS — M47812 Spondylosis without myelopathy or radiculopathy, cervical region: Secondary | ICD-10-CM

## 2016-06-24 DIAGNOSIS — M19042 Primary osteoarthritis, left hand: Secondary | ICD-10-CM

## 2016-06-24 DIAGNOSIS — R5383 Other fatigue: Secondary | ICD-10-CM | POA: Diagnosis not present

## 2016-06-24 DIAGNOSIS — M47816 Spondylosis without myelopathy or radiculopathy, lumbar region: Secondary | ICD-10-CM | POA: Diagnosis not present

## 2016-06-24 DIAGNOSIS — M19041 Primary osteoarthritis, right hand: Secondary | ICD-10-CM | POA: Diagnosis not present

## 2016-06-24 MED ORDER — DICLOFENAC SODIUM 1 % TD GEL: 4.0000 g | Freq: Three times a day (TID) | TRANSDERMAL | 3 refills | Status: AC | PRN

## 2016-06-24 MED ORDER — GABAPENTIN 100 MG PO CAPS: 100.0000 mg | ORAL_CAPSULE | Freq: Two times a day (BID) | ORAL | 1 refills | Status: AC

## 2016-06-24 NOTE — Progress Notes (Signed)
*IMAGE* Office Visit Note  Patient: Troy Collier             Date of Birth: 08/13/1946           MRN: [id number]             PCP: Olivia CanterKELLY, WILLIAM, MD Referring: Olivia CanterKelly, William, MD Visit Date: 06/24/2016    Subjective:  Follow-up on fibromyalgia syndrome and osteoarthritis of the hands degenerative disc disease of the C-spine and the lumbar spine.   History of Present Illness: Troy Collier is a 70 y.o. male  Patient complains of having osteoarthritis hand flare for the last 2-3 weeks. It's been going on for last 6 months. He has not used Voltaren gel yet because he's run out of the medication.  He is requesting us to prescribe gabapentin so that he can use that twice a day to better manage his pain  He's been getting oxycodone from his surgeon. He finds is helpful to you soon daytime. He was using about 3 pills per day but he has tapered it down to about 2 pills per day. We discussed at length that narcotics can be addictive nature need to discontinue medications as possible. To that and he is now going to be switching over to gabapentin and hopefully he'll be able to decrease the oxycodone even further.   Patient is semiretired and is planning on moving to [place] by the end of the year. He notices that his symptoms are much better when he is in [place]. He is planning on having his house sold by the end of the year. We discussed using [email or web address] to find a new rheumatologist in [place]   Activities of Daily Living:  Patient reports morning stiffness for 60 minutes.   Patient Reports nocturnal pain.  Difficulty dressing/grooming: Denies Difficulty climbing stairs: Reports Difficulty getting out of chair: Denies Difficulty using hands for taps, buttons, cutlery, and/or writing: Reports   No Rheumatology ROS completed.   PMFS History:  Patient Active Problem List   Diagnosis Date Noted  . Fibromyalgia 06/23/2016    Priority: Medium  . Fatigue 06/23/2016    Priority: Medium    . Insomnia 06/23/2016    Priority: Medium  . Osteoarthritis of both hands 06/23/2016  . DJD (degenerative joint disease), cervical 06/23/2016  . 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 Abscess in epidural space of lumbar spine 11/28/2014  . Scoliosis of lumbar spine 10/26/2014  . Essential hypertension, benign 11/04/2013  . 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 Prediabetes 04/26/2013  . Pure hypercholesterolemia 04/25/2013    Past Medical History:  Diagnosis Date  . Anxiety   . Arthritis   . Complication of anesthesia    hypotension,tmj,DIFFICULT TILT HEAD, lacking pituitary gland, pt. concerned about temperature regulation   . Diabetes insipidus (HCC)   . Diabetes mellitus without complication (HCC)    borderline,no meds A1C 5.7 on 10-20-2014  . Fibromyalgia   . Frequency of urination   . History of kidney stones   . Hyperlipidemia   . Hypertension   . Sleep apnea    mild no cpap recommended  . TMJ (temporomandibular joint disorder)     Family History  Problem Relation Age of Onset  . Hypertension Other   . Obesity Other   . Heart attack Other    Past Surgical History:  Procedure Laterality Date  . BRAIN SURGERY     2000   [facility], removed pituitary gland  . LUMBAR WOUND DEBRIDEMENT N/A 11/28/2014   Procedure:  LUMBAR WOUND DEBRIDEMENT;  Surgeon: Aliene Beams, MD;  Location: [facility];  Service: Neurosurgery;  Laterality: N/A;  . POSTERIOR CERVICAL FUSION/FORAMINOTOMY N/A 06/07/2013   Procedure: Exploration of posterior Cervical six-seven Fusion with Cervical seven thoracic-one posterior cervical decompression/fusion;  Surgeon: Maeola Harman, MD;  Location: [facility];  Service: Neurosurgery;  Laterality: N/A;  . POSTERIOR LUMBAR FUSION 4 LEVEL N/A 10/26/2014   Procedure: Lumbar two-three, Lumba three-four Posterior Lateral Arthrodesis, Lumbar four-five Thoracolumbar Interbody Fusion, Lumbar five-Sacral one Posterior lumbar interbody fusion;  Surgeon: Maeola Harman,  MD;  Location: [facility];  Service: Neurosurgery;  Laterality: N/A;  L2-3 L3-4 L4-5 L5-S1 Posterior lumbar interbody fusion  . ROTATOR CUFF REPAIR    . SPINE SURGERY    . ULNAR NERVE TRANSPOSITION Right 06/07/2013   Procedure: ULNAR NERVE DECOMPRESSION/TRANSPOSITION;  Surgeon: Maeola Harman, MD;  Location: [facility];  Service: Neurosurgery;  Laterality: Right;   Social History   Social History Narrative  . No narrative on file     Objective: Vital Signs: BP (!) 153/75 (BP Location: Left Arm, Patient Position: Sitting, Cuff Size: Large)   Pulse 79   Resp 13   Ht 5' 7.5" (1.715 m)   Wt 203 lb (92.1 kg)   BMI 31.33 kg/m    Physical Exam   Musculoskeletal Exam: Patient has full range of motion of all joints except he has decreased range of motion of bilateral hands.  CDAI Exam: CDAI Homunculus Exam:   Joint Counts:  CDAI Tender Joint count: 0 CDAI Swollen Joint count: 0  Global Assessments:  Patient Global Assessment: 6 Provider Global Assessment: 6  CDAI Calculated Score: 12    Investigation: No additional findings.   Imaging: No results found.  Speciality Comments: No specialty comments available.    Procedures:  No procedures performed Allergies: Accupril [quinapril hcl] and Niacin and related   Assessment / Plan: Visit Diagnoses: Fibromyalgia  Fatigue, unspecified type  Insomnia, unspecified type  Osteoarthritis of both hands, unspecified osteoarthritis type  DJD (degenerative joint disease), cervical  Osteoarthritis of lumbar spine, unspecified spinal osteoarthritis complication status   No problem-specific Assessment & Plan notes found for this encounter.   Follow-Up Instructions: No Follow-up on file.  Orders: No orders of the defined types were placed in this encounter.  Meds ordered this encounter  Medications  . gabapentin (NEURONTIN) 100 MG capsule    Sig: Take 1 capsule (100 mg total) by mouth 2 (two) times daily.     Dispense:  180 capsule    Refill:  1  . diclofenac sodium (VOLTAREN) 1 % GEL    Sig: Apply 4 g topically 3 (three) times daily as needed.    Dispense:  3 Tube    Refill:  3   Patient will start using Voltaren gel again to the hands to improve his overall flare. Our goal is to decrease his oxycodone usage. So he is elected to use gabapentin in the morning. He is moving to [place] so he will establish with a new rheumatologist. We discussed going to the rheumatology website to discover a good rheumatologist in his area

## 2016-07-08 IMAGING — MR MR LUMBAR SPINE W/O CM
4 of 6 series · 17 of 48 positions shown · non-contrast
Comparison: MRI 07/22/2014.

CLINICAL DATA: Prior back operation in [REDACTED]. Increasing back
pain. Lumbago/low back pain with radiations down the LEFT leg.
Intercedent inpatient rehabilitation stay following operation.

EXAM:
MRI LUMBAR SPINE WITHOUT CONTRAST
TECHNIQUE: Multiplanar, multisequence MR imaging of the lumbar spine was
performed. No intravenous contrast was administered.

[Series 4: T2 · sagittal · 4.0mm · 0.55mm/px · 4 of 13 slices shown (1 of 2)]
[im 1/13]
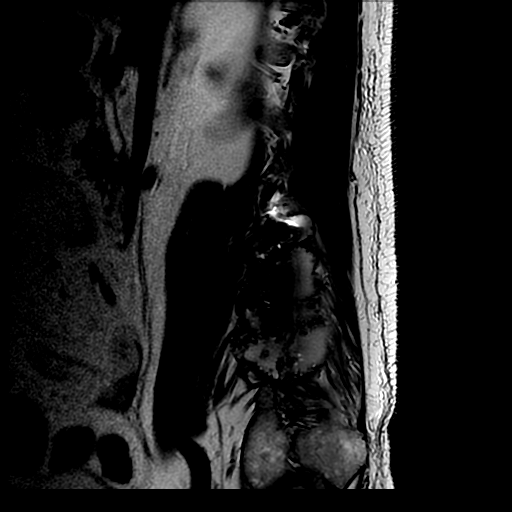
[im 5/13]
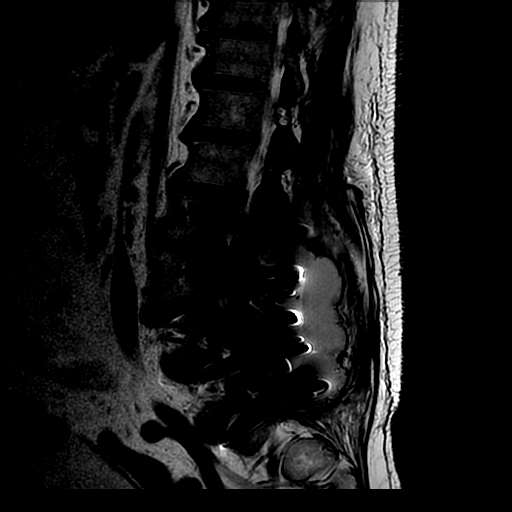
[im 9/13]
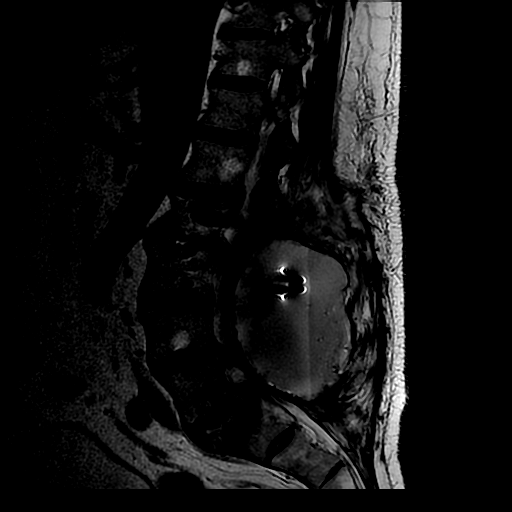
[im 13/13]
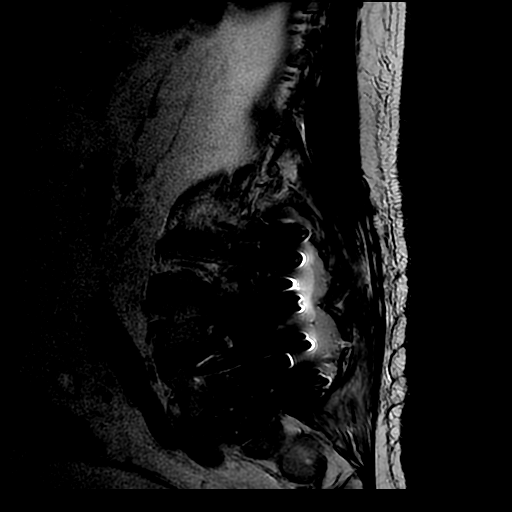

[Series 5: T1 · sagittal · 4.0mm · 0.55mm/px · 3 of 13 slices shown (1 of 2)]
[im 1/13]
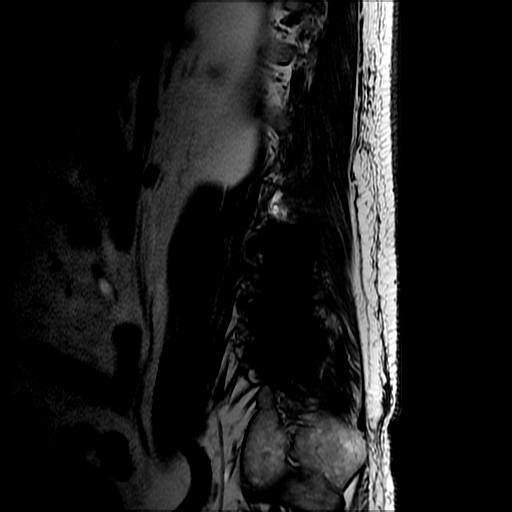
[im 7/13]
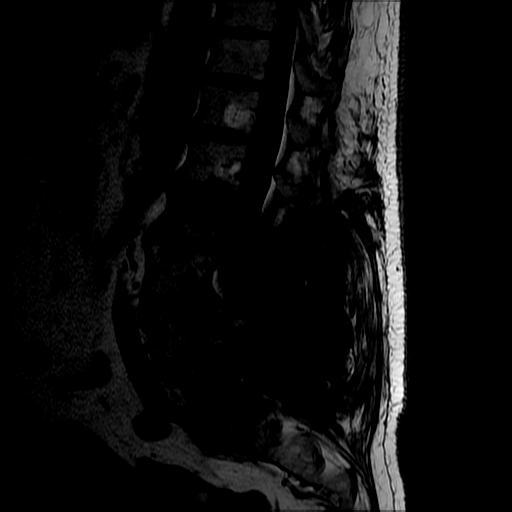
[im 13/13]
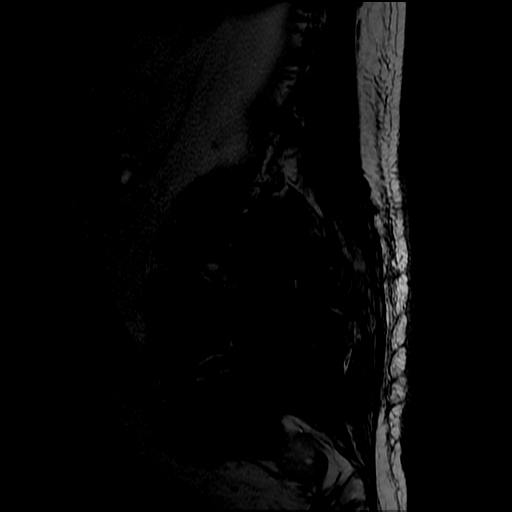

[Series 8: T2 · axial · 4.0mm · 0.39mm/px · z∈[-181,-10]mm · 7 of 36 slices shown (2 of 2)]
[im 1/36]
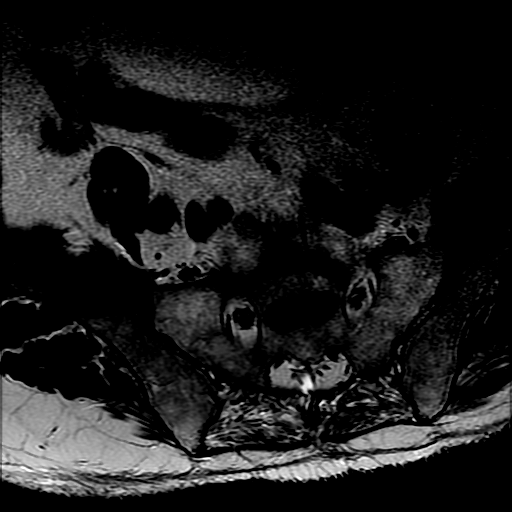
[im 6/36]
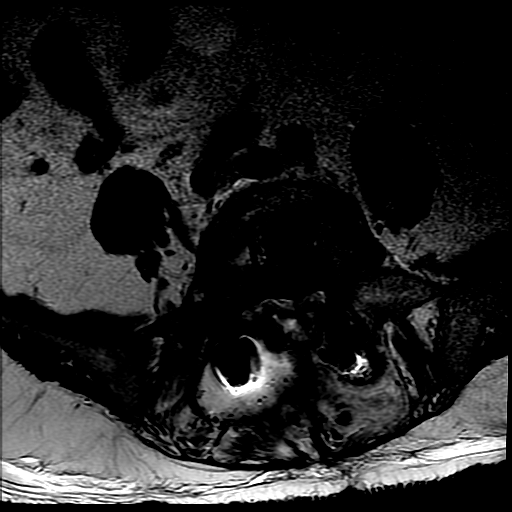
[im 11/36]
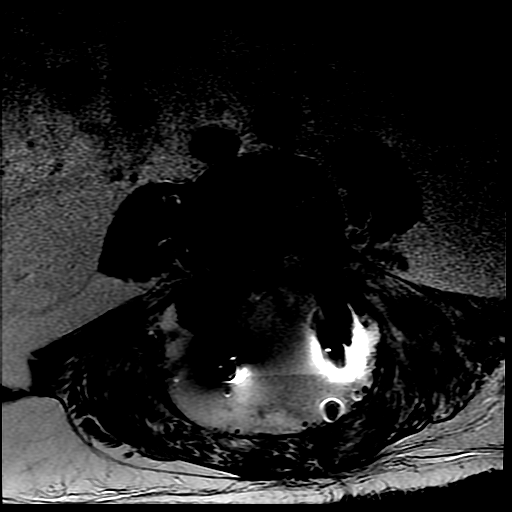
[im 17/36]
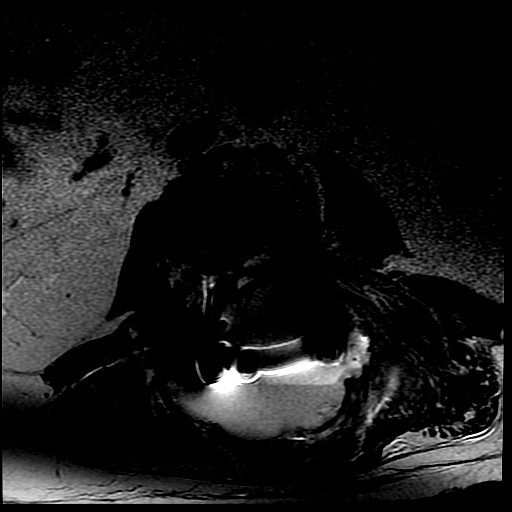
[im 19/36]
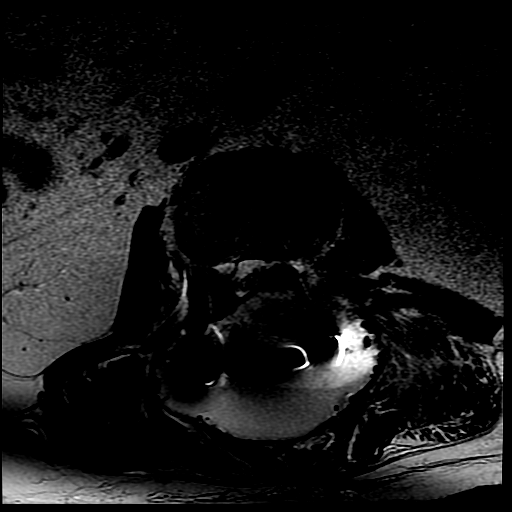
[im 25/36]
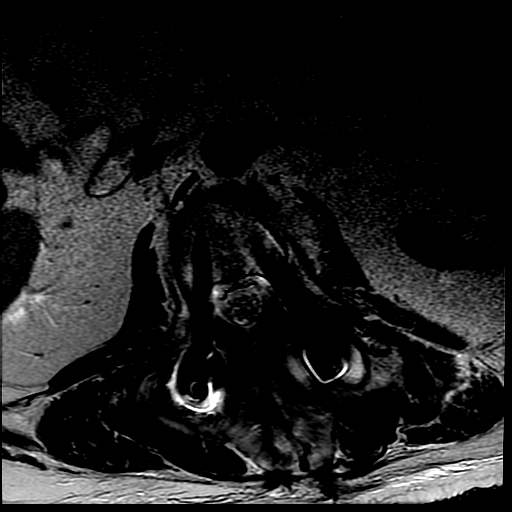
[im 30/36]
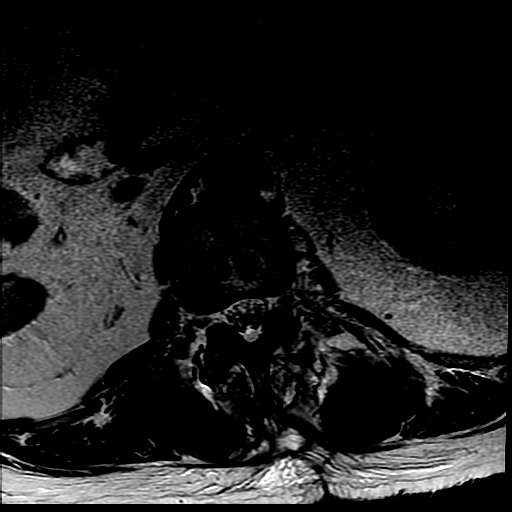

[Series 9: T1 · axial · 4.0mm · 0.39mm/px · z∈[-156,-10]mm · 3 of 36 slices shown (2 of 2)]
[im 6/36]
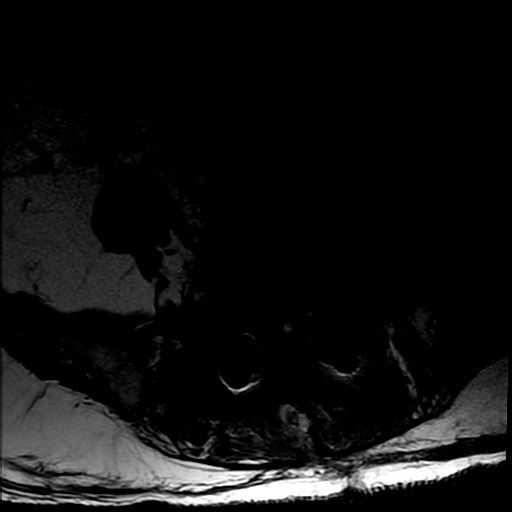
[im 19/36]
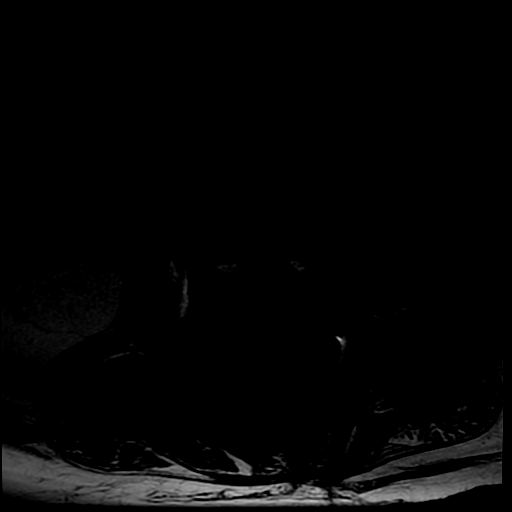
[im 30/36]
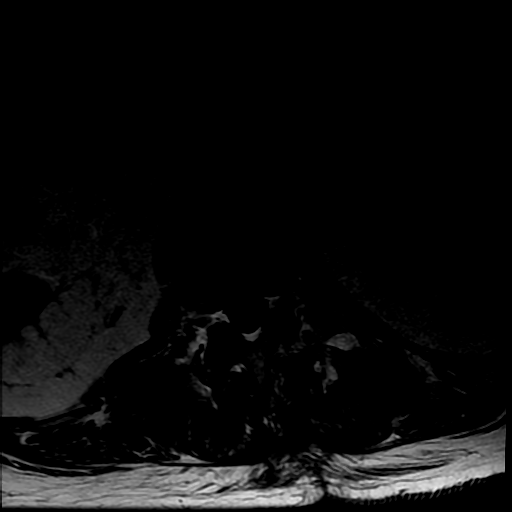

[17 of 48 positions shown; findings below may reference images not displayed]

FINDINGS: Segmentation: Numbering used on prior exam preserved.

Alignment: L2 through S1 posterior rod and screw fixation. There is
new grade I anterolisthesis of L4 on L5 measuring 2 mm.
Anterolisthesis of L5 on S1 appears similar. 3 mm retrolisthesis of
L2 on L3. L3-L4 alignment is within normal limits. Dextroconvex
curve is present with the apex at L2.

Vertebrae: Benign vertebral body hemangiomata present at T12 and L1.
No compression fracture. Following discectomy and back operation.
Postsurgical endplate changes are present from L2-L3 through L4-L5.
There is more edema at L5-S1 than at the other levels. Although this
can be seen in the postoperative setting, early infection cannot be
excluded at L5-S1. There is also paravertebral phlegmon around
L5-S1, increasing the concern for infection.

Conus medullaris: normal at L1-L2.

Paraspinal tissues: Diffuse edema in the posterior paraspinal
musculature. There is a large fluid collection that surrounds the
PLIF hardware from L2 through S1. This collection measures 9.3 cm
craniocaudal. Maximal transverse dimension is 9.4 cm. AP this
measures about 6 cm. There is mass effect on the posterior thecal
sac compressing the nerves of the cauda equina at L4-L5.

Disc levels:

Disc Signal: Normal.

T10-T11 through L1-L2 discs appear similar to the preoperative
examination. No significant stenosis is present at these levels.
Disc desiccation and bulging.

L2-L3: Mild central stenosis secondary to residual annulus.
Bilateral L2 laminotomies. Moderate LEFT foraminal stenosis appears
present although this is poorly evaluated due to artifact from
hardware.

L3-L4: L3 laminectomy. Mild to moderate central stenosis
predominantly due to mass effect from fluid collection in the
laminectomy bed. Neural foramina appear patent. The disc remains
present, with shallow posterior disc bulging.

L4-L5: L4 laminectomy. Severe central stenosis predominately due to
mass effect from the posterior laminectomy fluid collection. There
is complete effacement of contrast in the thecal sac at the level of
the disc. Mild bilateral foraminal stenosis is present.

L5-S1: L5 laminectomy. The central canal is difficult to define
based on the the mass effect from the fluid collection an
postoperative debris in and around the thecal sac. Severe central
stenosis is present with compression of the nerve roots due to mass
effect from the posterior fluid collection. The LEFT S1 pedicle
screw appears to traverse the LEFT lateral recess, approaching if
not contacting the descending LEFT S1 nerve.

No definite anterior epidural abscess is identified however in the
absence of contrast, this is poorly evaluated.
IMPRESSION: 1. L2 through S1 posterior lumbar interbody fusion. Increased
endplate edema at L5-S1 relative to the other postoperative levels.
Paravertebral phlegmon around L5-S1. These findings are most
suggestive of postoperative infection of the L5-S1 disc space with
endplate osteomyelitis. Correlation with laboratory studies
recommended.
2. Critical Value/emergent results were called by telephone at the
time of interpretation on 11/28/2014 at [DATE] to Dr. BRECKENRIDGE , who
verbally acknowledged these results.
3. Large fluid collection in the laminectomy bed extending from L2
through S1, measuring over 9 cm transverse and craniocaudal. This
produces mass effect on the thecal sac and compression of the thecal
sac at L4-L5 and L5-S1. Differential considerations for the fluid
collection are abscess, spinal leak (pseudomeningocele) and
postoperative hematoma. A combination of these is probably present
based on signal characteristics.

## 2016-10-12 ENCOUNTER — Other Ambulatory Visit: Payer: Self-pay | Admitting: Endocrinology

## 2016-11-24 ENCOUNTER — Ambulatory Visit: Payer: Medicare Other | Admitting: Rheumatology

## 2016-12-09 NOTE — Progress Notes (Deleted)
Office Visit Note  Patient: Troy Collier             Date of Birth: 11/10/1945           MRN: [id number]             PCP: Olivia Canter, MD Referring: Olivia Canter, MD Visit Date: 12/11/2016 Occupation: @    Subjective:  No chief complaint on file.   History of Present Illness: Arihant Pennings is a 71 y.o. male ***   Activities of Daily Living:  Patient reports morning stiffness for *** {minute/hour:19697}.   Patient {ACTIONS;DENIES/REPORTS:21021675::"Denies"} nocturnal pain.  Difficulty dressing/grooming: {ACTIONS;DENIES/REPORTS:21021675::"Denies"} Difficulty climbing stairs: {ACTIONS;DENIES/REPORTS:21021675::"Denies"} Difficulty getting out of chair: {ACTIONS;DENIES/REPORTS:21021675::"Denies"} Difficulty using hands for taps, buttons, cutlery, and/or writing: {ACTIONS;DENIES/REPORTS:21021675::"Denies"}   No Rheumatology ROS completed.   PMFS History:  Patient Active Problem List   Diagnosis Date Noted  . Fibromyalgia 06/23/2016  . Fatigue 06/23/2016  . Insomnia 06/23/2016  . Osteoarthritis of both hands 06/23/2016  . DJD (degenerative joint disease), cervical 06/23/2016  . 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 Abscess in epidural space of lumbar spine 11/28/2014  . Scoliosis of lumbar spine 10/26/2014  . Essential hypertension, benign 11/04/2013  . 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 Prediabetes 04/26/2013  . Pure hypercholesterolemia 04/25/2013    Past Medical History:  Diagnosis Date  . Anxiety   . Arthritis   . Complication of anesthesia    hypotension,tmj,DIFFICULT TILT HEAD, lacking pituitary gland, pt. concerned about temperature regulation   . Diabetes insipidus (HCC)   . Diabetes mellitus without complication (HCC)    borderline,no meds A1C 5.7 on 10-20-2014  . Fibromyalgia   . Frequency of urination   . History of kidney stones   . Hyperlipidemia   . Hypertension   . Sleep apnea    mild no cpap recommended  . TMJ  (temporomandibular joint disorder)     Family History  Problem Relation Age of Onset  . Hypertension Other   . Obesity Other   . Heart attack Other    Past Surgical History:  Procedure Laterality Date  . BRAIN SURGERY     2000   [facility], removed pituitary gland  . LUMBAR WOUND DEBRIDEMENT N/A 11/28/2014   Procedure: LUMBAR WOUND DEBRIDEMENT;  Surgeon: Aliene Beams, MD;  Location: [facility];  Service: Neurosurgery;  Laterality: N/A;  . POSTERIOR CERVICAL FUSION/FORAMINOTOMY N/A 06/07/2013   Procedure: Exploration of posterior Cervical six-seven Fusion with Cervical seven thoracic-one posterior cervical decompression/fusion;  Surgeon: Maeola Harman, MD;  Location: [facility];  Service: Neurosurgery;  Laterality: N/A;  . POSTERIOR LUMBAR FUSION 4 LEVEL N/A 10/26/2014   Procedure: Lumbar two-three, Lumba three-four Posterior Lateral Arthrodesis, Lumbar four-five Thoracolumbar Interbody Fusion, Lumbar five-Sacral one Posterior lumbar interbody fusion;  Surgeon: Maeola Harman, MD;  Location: [facility];  Service: Neurosurgery;  Laterality: N/A;  L2-3 L3-4 L4-5 L5-S1 Posterior lumbar interbody fusion  . ROTATOR CUFF REPAIR    . SPINE SURGERY    . ULNAR NERVE TRANSPOSITION Right 06/07/2013   Procedure: ULNAR NERVE DECOMPRESSION/TRANSPOSITION;  Surgeon: Maeola Harman, MD;  Location: [facility];  Service: Neurosurgery;  Laterality: Right;   Social History   Social History Narrative  . No narrative on file     Objective: Vital Signs: There were no vitals taken for this visit.   Physical Exam   Musculoskeletal Exam: ***  CDAI Exam: No CDAI exam completed.    Investigation: No additional findings. No visits with results within 6 Month(s)  from this visit.  Latest known visit with results is:  Office Visit on 03/28/2016  Component Date Value Ref Range Status  . Insulin-Like GF-1 03/28/2016 110  47 - 192 ng/mL Final     Imaging: No results found.  Speciality Comments: No specialty  comments available.    Procedures:  No procedures performed Allergies: Accupril [quinapril hcl] and Niacin and related   Assessment / Plan:     Visit Diagnoses: No diagnosis found.    Orders: No orders of the defined types were placed in this encounter.  No orders of the defined types were placed in this encounter.   Face-to-face time spent with patient was *** minutes. 50% of time was spent in counseling and coordination of care.  Follow-Up Instructions: No Follow-up on file.   Lillia Lengel, [place]  Note - This record has been created using [employer].  Chart creation errors have been sought, but may not always  have been located. Such creation errors do not reflect on  the standard of medical care.

## 2016-12-11 ENCOUNTER — Ambulatory Visit: Payer: Medicare Other | Admitting: Rheumatology

## 2017-01-02 ENCOUNTER — Other Ambulatory Visit: Payer: Self-pay | Admitting: Endocrinology

## 2017-02-23 ENCOUNTER — Telehealth: Payer: Self-pay | Admitting: Endocrinology

## 2017-02-24 ENCOUNTER — Other Ambulatory Visit: Payer: Self-pay | Admitting: Endocrinology

## 2017-02-24 NOTE — Telephone Encounter (Signed)
He was supposed to be moving out of state and will need to deny this

## 2017-02-24 NOTE — Telephone Encounter (Signed)
This has been denied with a note that patient needs a follow up appointment.

## 2017-02-24 NOTE — Telephone Encounter (Signed)
Please advise if okay to refill. Has not been seen since 03/2016 and no future appointment scheduled.

## 2017-02-25 ENCOUNTER — Other Ambulatory Visit: Payer: Self-pay

## 2017-03-18 ENCOUNTER — Other Ambulatory Visit: Payer: Self-pay | Admitting: Endocrinology

## 2017-03-19 ENCOUNTER — Other Ambulatory Visit: Payer: Self-pay

## 2017-03-19 ENCOUNTER — Telehealth: Payer: Self-pay | Admitting: Endocrinology

## 2017-03-19 MED ORDER — TESTOSTERONE 20.25 MG/ACT (1.62%) TD GEL: TRANSDERMAL | 3 refills | Status: DC

## 2017-03-19 NOTE — Telephone Encounter (Signed)
Ordered and waiting for Dr. Lucianne MussKumar to sign fax

## 2017-03-19 NOTE — Telephone Encounter (Signed)
Routing to you °

## 2017-03-19 NOTE — Telephone Encounter (Signed)
Patient needs a call concerning his ANDROGEL PUMP 20.25 MG/ACT (1.62%) GEL. Patient has not moved yet.   **Remind patient they can make refill requests via MyChart**  Medication refill request (Name & Dosage): ANDROGEL PUMP 20.25 MG/ACT (1.62%) GEL  Preferred pharmacy (Name & Address):  [facility] [id number] - [place], [place] - [address] AT [facility] [address] [phone] (Phone) [phone] (Fax)   Other comments (if applicable):  Patient requesting a call to advise

## 2017-03-20 NOTE — Telephone Encounter (Signed)
Called patient and left a voice message that it has been a year since he was last seen by Dr. Lucianne MussKumar and will not be able to get this filled unless he comes in for an appointment. Dr. Lucianne MussKumar also stated that this patient was moving out of town/state and was under the impression that he would be getting a new provider.

## 2017-03-25 ENCOUNTER — Other Ambulatory Visit: Payer: Self-pay | Admitting: Endocrinology

## 2017-03-25 DIAGNOSIS — R7303 Prediabetes: Secondary | ICD-10-CM

## 2017-03-25 DIAGNOSIS — E230 Hypopituitarism: Secondary | ICD-10-CM

## 2017-03-26 ENCOUNTER — Other Ambulatory Visit (INDEPENDENT_AMBULATORY_CARE_PROVIDER_SITE_OTHER): Payer: Medicare Other

## 2017-03-26 DIAGNOSIS — R7303 Prediabetes: Secondary | ICD-10-CM | POA: Diagnosis not present

## 2017-03-26 DIAGNOSIS — E230 Hypopituitarism: Secondary | ICD-10-CM

## 2017-03-26 LAB — COMPREHENSIVE METABOLIC PANEL
ALK PHOS: 63 U/L (ref 39–117)
ALT: 27 U/L (ref 0–53)
AST: 18 U/L (ref 0–37)
Albumin: 4.4 g/dL (ref 3.5–5.2)
BUN: 14 mg/dL (ref 6–23)
CO2: 32 meq/L (ref 19–32)
Calcium: 9.4 mg/dL (ref 8.4–10.5)
Chloride: 104 mEq/L (ref 96–112)
Creatinine, Ser: 0.89 mg/dL (ref 0.40–1.50)
GFR: 89.87 mL/min (ref >60.00)
GLUCOSE: 113 mg/dL — AB (ref 70–99)
POTASSIUM: 3.9 meq/L (ref 3.5–5.1)
SODIUM: 142 meq/L (ref 135–145)
TOTAL PROTEIN: 7 g/dL (ref 6.0–8.3)
Total Bilirubin: 1 mg/dL (ref 0.2–1.2)

## 2017-03-26 LAB — HEMOGLOBIN A1C: HEMOGLOBIN A1C: 6.1 % (ref 4.6–6.5)

## 2017-03-26 LAB — T4, FREE: Free T4: 1.07 ng/dL (ref 0.60–1.60)

## 2017-03-26 LAB — TESTOSTERONE: TESTOSTERONE: 428.22 ng/dL (ref 300.00–890.00)

## 2017-03-27 LAB — INSULIN-LIKE GROWTH FACTOR: INSULIN LIKE GF 1: 91 ng/mL (ref 47–192)

## 2017-03-30 ENCOUNTER — Ambulatory Visit (INDEPENDENT_AMBULATORY_CARE_PROVIDER_SITE_OTHER): Payer: Medicare Other | Admitting: Endocrinology

## 2017-03-30 ENCOUNTER — Encounter: Payer: Self-pay | Admitting: Endocrinology

## 2017-03-30 ENCOUNTER — Other Ambulatory Visit: Payer: Self-pay | Admitting: Endocrinology

## 2017-03-30 ENCOUNTER — Other Ambulatory Visit: Payer: Self-pay

## 2017-03-30 VITALS — BP 132/78 | HR 86 | Ht 67.0 in | Wt 205.2 lb

## 2017-03-30 DIAGNOSIS — I10 Essential (primary) hypertension: Secondary | ICD-10-CM | POA: Diagnosis not present

## 2017-03-30 DIAGNOSIS — E230 Hypopituitarism: Secondary | ICD-10-CM

## 2017-03-30 DIAGNOSIS — R7303 Prediabetes: Secondary | ICD-10-CM | POA: Diagnosis not present

## 2017-03-30 LAB — URINALYSIS, ROUTINE W REFLEX MICROSCOPIC
Hgb urine dipstick: NEGATIVE
Ketones, ur: NEGATIVE
Leukocytes, UA: NEGATIVE
Nitrite: NEGATIVE
RBC / HPF: NONE SEEN (ref 0–?)
SPECIFIC GRAVITY, URINE: 1.025 (ref 1.000–1.030)
Total Protein, Urine: NEGATIVE
URINE GLUCOSE: NEGATIVE
Urobilinogen, UA: 0.2 (ref 0.0–1.0)
WBC UA: NONE SEEN (ref 0–?)
pH: 5.5 (ref 5.0–8.0)

## 2017-03-30 MED ORDER — DESMOPRESSIN ACETATE 0.2 MG PO TABS: 400.0000 ug | ORAL_TABLET | Freq: Three times a day (TID) | ORAL | 0 refills | Status: AC

## 2017-03-30 MED ORDER — TESTOSTERONE 20.25 MG/ACT (1.62%) TD GEL: TRANSDERMAL | 3 refills | Status: DC

## 2017-03-30 MED ORDER — HYDROCORTISONE 5 MG PO TABS: ORAL_TABLET | ORAL | 0 refills | Status: AC

## 2017-03-30 NOTE — Progress Notes (Signed)
Patient ID: Troy Collier, male   DOB: Feb 24, 1946, 71 y.o.   MRN: [id number]   Chief complaint: Followup of hypopituitarism and prediabetes  History of Present Illness:  He has had pituitary adenoma surgery in 09/1997.  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 His levels have been variable but was  requiring relatively low doses.  Because of higher cost of the medication out of pocket he stopped taking it in 2017 on his own  .  He does not complaining of unusual fatigue recently  Last IGF 1 level is again normal  2. Hypothyroidism: Has been on relatively stable doses of thyroid supplement since his pituitary surgery.  He does feel fairly good now with his energy level and no cold intolerance Has been compliant with his medication every morning, now taking 150 g levothyroxine Free T4 level is consistently normal    Lab Results  Component Value Date   FREET4 1.07 03/26/2017   FREET4 1.11 03/25/2016   FREET4 0.97 09/28/2015   TSH 0.04 (L) 05/28/2015   TSH 0.10 (L) 04/26/2013    3. Hypogonadism: He is Taking AndroGel now using pumps daily.   He is applying this regularly every morning Overall energy level is fairly good.   His testosterone level has been fluctuating somewhat but now it is at a good level of 428, previously with 3 pumps daily his level was 677   Lab Results  Component Value Date   TESTOSTERONE 428.22 03/26/2017   TESTOSTERONE 676.79 03/25/2016   TESTOSTERONE 319.30 09/28/2015     4.  Adrenal insufficiency: He has been on stable doses of hydrocortisone. He has been taking only 15 mg hydrocortisone a day with no symptoms of adrenal insufficiency.  No lightheadedness, weakness or nausea.  He says that he is missing  dose he will start feeling a little nausea. Also he when he had a respiratory infection he was given a stress dose in the  emergency room, he was feeling nauseated then Otherwise feels fairly good and no lightheadedness, decreased appetite or weight loss  5. Diabetes insipidus: He continues to take desmopressin, 2 tablets 3 times a day, usually every 8 hours.    He tends to have increased urinary frequency and thirst if he takes less than 6 tablets a day  Serum sodium is stable   OTHER problems:  He has had impaired fasting glucose and IFG and this has been managed with diet and exercise alone. A1c is appearing to be progressively higher although he thinks it was better with his PCP His glucose was 113 but he is not sure if he was fasting for this are not More recently has been not planning his meals well and eating poorly as well as not exercising  Wt Readings from Last 3 Encounters:  03/30/17 205 lb 3.2 oz (93.1 kg)  06/24/16 203 lb (92.1 kg)  03/28/16 202 lb (91.6 kg)    Lab Results  Component Value Date   HGBA1C 6.1 03/26/2017   HGBA1C 5.8 03/25/2016   HGBA1C 5.6 02/26/2015   Lab Results  Component Value Date   LDLCALC 45 05/28/2015   CREATININE 0.89 03/26/2017    Dyslipidemia/metabolic syndrome: He is on Lipitor, tends to have relatively low HDL, Followed by his PCP Outside labs not available  Lab Results  Component Value Date   CHOL 94 05/28/2015  HDL 32.40 (L) 05/28/2015   LDLCALC 45 05/28/2015   TRIG 86.0 05/28/2015   CHOLHDL 3 05/28/2015     LABS:    Lab on 03/26/2017  Component Date Value Ref Range Status  . Hgb A1c MFr Bld 03/26/2017 6.1  4.6 - 6.5 % Final   Glycemic Control Guidelines for People with Diabetes:Non Diabetic:  <6%Goal of Therapy: <7%Additional Action Suggested:  >8%   . Sodium 03/26/2017 142  135 - 145 mEq/L Final  . Potassium 03/26/2017 3.9  3.5 - 5.1 mEq/L Final  . Chloride 03/26/2017 104  96 - 112 mEq/L Final  . CO2 03/26/2017 32  19 - 32 mEq/L Final  . Glucose, Bld 03/26/2017 113* 70 - 99 mg/dL Final  . BUN 45/40/981107/26/2018 14  6 - 23 mg/dL Final  .  Creatinine, Ser 03/26/2017 0.89  0.40 - 1.50 mg/dL Final  . Total Bilirubin 03/26/2017 1.0  0.2 - 1.2 mg/dL Final  . Alkaline Phosphatase 03/26/2017 63  39 - 117 U/L Final  . AST 03/26/2017 18  0 - 37 U/L Final  . ALT 03/26/2017 27  0 - 53 U/L Final  . Total Protein 03/26/2017 7.0  6.0 - 8.3 g/dL Final  . Albumin 91/47/829507/26/2018 4.4  3.5 - 5.2 g/dL Final  . Calcium 62/13/086507/26/2018 9.4  8.4 - 10.5 mg/dL Final  . GFR 78/46/962907/26/2018 89.87  >60.00 mL/min Final  . Free T4 03/26/2017 1.07  0.60 - 1.60 ng/dL Final   Comment: Specimens from patients who are undergoing biotin therapy and /or ingesting biotin supplements may contain high levels of biotin.  The higher biotin concentration in these specimens interferes with this Free T4 assay.  Specimens that contain high levels  of biotin may cause false high results for this Free T4 assay.  Please interpret results in light of the total clinical presentation of the patient.    . Testosterone 03/26/2017 428.22  300.00 - 890.00 ng/dL Final  . Insulin-Like GF-1 03/26/2017 91  47 - 192 ng/mL Final     Allergies as of 03/30/2017      Reactions   Accupril [quinapril Hcl]    Niacin And Related       Medication List       Accurate as of 03/30/17  2:06 PM. Always use your most recent med list.          atorvastatin 10 MG tablet Commonly known as:  LIPITOR Take 10 mg by mouth daily.   calcium carbonate 600 MG Tabs tablet Commonly known as:  OS-CAL Take 600 mg by mouth 2 (two) times daily with a meal.   cyclobenzaprine 10 MG tablet Commonly known as:  FLEXERIL Take 10 mg by mouth 3 (three) times daily as needed for muscle spasms.   desmopressin 0.2 MG tablet Commonly known as:  DDAVP Take 2 tablets (400 mcg total) by mouth 3 (three) times daily.   diclofenac sodium 1 % Gel Commonly known as:  VOLTAREN Apply 4 g topically 3 (three) times daily as needed.   DULoxetine 60 MG capsule Commonly known as:  CYMBALTA Take 60 mg by mouth daily.   gabapentin  100 MG capsule Commonly known as:  NEURONTIN Take 1 capsule (100 mg total) by mouth 2 (two) times daily.   GLUCOSAMINE MSM COMPLEX PO Take 1 tablet by mouth daily.   hydrocortisone 5 MG tablet Commonly known as:  CORTEF TAKE 2 TABLETS BY MOUTH IN THE MORNING AND 1 TABLET AT 5 PM EVERY DAY   levothyroxine  150 MCG tablet Commonly known as:  SYNTHROID, LEVOTHROID Take 1 tablet (150 mcg total) by mouth daily before breakfast.   lidocaine 5 % Commonly known as:  LIDODERM Place 1 patch onto the skin daily. Remove & Discard patch within 12 hours or as directed by MD   losartan 100 MG tablet Commonly known as:  COZAAR Take 100 mg by mouth daily.   multivitamin with minerals Tabs tablet Take 1 tablet by mouth daily.   oxyCODONE-acetaminophen 5-325 MG tablet Commonly known as:  PERCOCET/ROXICET Take 1-2 tablets by mouth every 6 (six) hours as needed for moderate pain or severe pain.   psyllium 28 % packet Commonly known as:  METAMUCIL SMOOTH TEXTURE Take 1 packet by mouth 2 (two) times daily.   sildenafil 20 MG tablet Commonly known as:  REVATIO TAKE 5 TABLETS BY MOUTH 30 MINUTES PRIOR TO ACTIVITY   tamsulosin 0.4 MG Caps capsule Commonly known as:  FLOMAX Take 0.4 mg by mouth daily after supper.   Testosterone 20.25 MG/ACT (1.62%) Gel Commonly known as:  ANDROGEL PUMP USE 2 PUMPS DAILY   traZODone 100 MG tablet Commonly known as:  DESYREL Take 100 mg by mouth at bedtime.   vitamin C 500 MG tablet Commonly known as:  ASCORBIC ACID Take 500 mg by mouth 2 (two) times daily.   Vitamin D 2000 units tablet Take 2,000 Units by mouth daily.       Allergies:  Allergies  Allergen Reactions  . Accupril [Quinapril Hcl]   . Niacin And Related     Past Medical History:  Diagnosis Date  . Anxiety   . Arthritis   . Complication of anesthesia    hypotension,tmj,DIFFICULT TILT HEAD, lacking pituitary gland, pt. concerned about temperature regulation   . Diabetes  insipidus (HCC)   . Diabetes mellitus without complication (HCC)    borderline,no meds A1C 5.7 on 10-20-2014  . Fibromyalgia   . Frequency of urination   . History of kidney stones   . Hyperlipidemia   . Hypertension   . Sleep apnea    mild no cpap recommended  . TMJ (temporomandibular joint disorder)     Past Surgical History:  Procedure Laterality Date  . BRAIN SURGERY     2000   [facility], removed pituitary gland  . LUMBAR WOUND DEBRIDEMENT N/A 11/28/2014   Procedure: LUMBAR WOUND DEBRIDEMENT;  Surgeon: Aliene Beamsandy Kritzer, MD;  Location: [facility];  Service: Neurosurgery;  Laterality: N/A;  . POSTERIOR CERVICAL FUSION/FORAMINOTOMY N/A 06/07/2013   Procedure: Exploration of posterior Cervical six-seven Fusion with Cervical seven thoracic-one posterior cervical decompression/fusion;  Surgeon: Maeola HarmanJoseph Stern, MD;  Location: [facility];  Service: Neurosurgery;  Laterality: N/A;  . POSTERIOR LUMBAR FUSION 4 LEVEL N/A 10/26/2014   Procedure: Lumbar two-three, Lumba three-four Posterior Lateral Arthrodesis, Lumbar four-five Thoracolumbar Interbody Fusion, Lumbar five-Sacral one Posterior lumbar interbody fusion;  Surgeon: Maeola HarmanJoseph Stern, MD;  Location: [facility];  Service: Neurosurgery;  Laterality: N/A;  L2-3 L3-4 L4-5 L5-S1 Posterior lumbar interbody fusion  . ROTATOR CUFF REPAIR    . SPINE SURGERY    . ULNAR NERVE TRANSPOSITION Right 06/07/2013   Procedure: ULNAR NERVE DECOMPRESSION/TRANSPOSITION;  Surgeon: Maeola HarmanJoseph Stern, MD;  Location: [facility];  Service: Neurosurgery;  Laterality: Right;    Family History  Problem Relation Age of Onset  . Hypertension Other   . Obesity Other   . Heart attack Other     Social History:  reports that he has never smoked. He has never  used smokeless tobacco. He reports that he drinks about 0.6 oz of alcohol per week . He reports that he does not use drugs.  Review of Systems -   HYPERTENSION: He has been on treatment With losartan for several  years Followed by PCP   History of depression treated with Cymbalta   EXAM:  BP 132/78   Pulse 86   Ht 5\' 7"  (1.702 m)   Wt 205 lb 3.2 oz (93.1 kg)   SpO2 96%   BMI 32.14 kg/m   Standing blood pressure 130/74 Skin normal Biceps reflexes appear normal  Assessment/Plan:   PANHYPOPITUITARISM:  1. Secondary hypothyroidism: He  has a normal free T4 with 150 g levothyroxine.  He has had a consistent dosage.  No change in dosage needed   2. Cortisol deficiency: He  has been doing well with only small doses of hydrocortisone supplement.   Even though he  is still taking only 15 he does not  have any signs or symptoms of adrenal insufficiency.  Has been aware of using stress doses for illness.  He will continue the same dose and be consistently compliant with this 3. Growth hormone deficiency, long-term and previously requiring low-dose supplementation with good results He has not required any supplementation more recently in his levels are still fairly good without treatment He is not complaining of any unusual fatigue or weight gain  4. Hypogonadism: Subjectively has been doing well with supplementation and his level is quite normal with taking 2 pumps of AndroGel  Need to check his level again in 6 months  Diabetes insipidus, long-standing and well-controlled with his DDAVP 3 times a day; urine specific gravity pending and sodium is normal   Impaired fasting glucose: A1c upper normal.  He has not been watching his diet and exercising and discussed need to work on this veteran follow-up with his new PCP in [place]  HYPERTENSION: Controlled, Followed by PCP  Total visit time for evaluation and management of multiple problems, medications, Labs and counseling = 25 minutes  There are no Patient Instructions on file for this visit.    Deshon Hsiao 03/30/2017, 2:06 PM   Note: This office note was prepared with Dragon voice recognition system technology. Any transcriptional errors  that result from this process are unintentional.

## 2017-04-03 ENCOUNTER — Other Ambulatory Visit: Payer: Self-pay | Admitting: Endocrinology

## 2017-04-03 NOTE — Telephone Encounter (Signed)
Gave verbal to Debbie at the pharmacy on 8/3 for the androgel
